# Patient Record
Sex: Male | Born: 1960
Health system: Southern US, Community
[De-identification: ages and names within clinical notes are randomized; demographics above are authoritative.]

## PROBLEM LIST (undated history)

## (undated) DIAGNOSIS — I10 Essential (primary) hypertension: Secondary | ICD-10-CM

## (undated) HISTORY — PX: CHOLECYSTECTOMY: SHX55

---

## 2006-06-13 ENCOUNTER — Emergency Department (HOSPITAL_COMMUNITY): Admission: EM | Admit: 2006-06-13 | Discharge: 2006-06-13 | Payer: Self-pay | Admitting: Emergency Medicine

## 2008-02-07 ENCOUNTER — Encounter: Admission: RE | Admit: 2008-02-07 | Discharge: 2008-02-07 | Payer: Self-pay | Admitting: Family Medicine

## 2008-02-10 ENCOUNTER — Emergency Department (HOSPITAL_COMMUNITY): Admission: EM | Admit: 2008-02-10 | Discharge: 2008-02-10 | Payer: Self-pay | Admitting: Emergency Medicine

## 2008-03-04 ENCOUNTER — Encounter (INDEPENDENT_AMBULATORY_CARE_PROVIDER_SITE_OTHER): Payer: Self-pay | Admitting: General Surgery

## 2008-03-04 ENCOUNTER — Ambulatory Visit (HOSPITAL_COMMUNITY): Admission: RE | Admit: 2008-03-04 | Discharge: 2008-03-04 | Payer: Self-pay | Admitting: General Surgery

## 2008-06-08 ENCOUNTER — Emergency Department (HOSPITAL_COMMUNITY): Admission: EM | Admit: 2008-06-08 | Discharge: 2008-06-08 | Payer: Self-pay | Admitting: Family Medicine

## 2010-04-26 LAB — DIFFERENTIAL
Basophils Absolute: 0.1 10*3/uL (ref 0.0–0.1)
Basophils Absolute: 0.3 10*3/uL — ABNORMAL HIGH (ref 0.0–0.1)
Basophils Relative: 1 % (ref 0–1)
Eosinophils Absolute: 0.3 10*3/uL (ref 0.0–0.7)
Eosinophils Absolute: 0.4 10*3/uL (ref 0.0–0.7)
Eosinophils Relative: 2 % (ref 0–5)
Lymphs Abs: 3.6 10*3/uL (ref 0.7–4.0)
Monocytes Absolute: 0.3 10*3/uL (ref 0.1–1.0)
Monocytes Absolute: 0.8 10*3/uL (ref 0.1–1.0)

## 2010-04-26 LAB — COMPREHENSIVE METABOLIC PANEL
ALT: 83 U/L — ABNORMAL HIGH (ref 0–53)
AST: 17 U/L (ref 0–37)
AST: 31 U/L (ref 0–37)
Albumin: 3.7 g/dL (ref 3.5–5.2)
Alkaline Phosphatase: 107 U/L (ref 39–117)
Alkaline Phosphatase: 108 U/L (ref 39–117)
CO2: 25 mEq/L (ref 19–32)
Chloride: 104 mEq/L (ref 96–112)
Chloride: 108 mEq/L (ref 96–112)
Creatinine, Ser: 0.6 mg/dL (ref 0.4–1.5)
GFR calc Af Amer: >60 mL/min (ref >60)
GFR calc Af Amer: >60 mL/min (ref >60)
GFR calc non Af Amer: >60 mL/min (ref >60)
Potassium: 4.1 mEq/L (ref 3.5–5.1)
Potassium: 3.8 mEq/L (ref 3.5–5.1)
Sodium: 138 mEq/L (ref 135–145)
Total Bilirubin: 0.4 mg/dL (ref 0.3–1.2)
Total Bilirubin: 0.7 mg/dL (ref 0.3–1.2)

## 2010-04-26 LAB — CBC
HCT: 39.1 % (ref 39.0–52.0)
MCV: 92.3 fL (ref 78.0–100.0)
Platelets: 362 10*3/uL (ref 150–400)
RBC: 4.34 MIL/uL (ref 4.22–5.81)
RBC: 3.91 MIL/uL — ABNORMAL LOW (ref 4.22–5.81)
WBC: 12.7 10*3/uL — ABNORMAL HIGH (ref 4.0–10.5)
WBC: 12.7 10*3/uL — ABNORMAL HIGH (ref 4.0–10.5)

## 2010-04-26 LAB — URINALYSIS, ROUTINE W REFLEX MICROSCOPIC
Bilirubin Urine: NEGATIVE
Nitrite: NEGATIVE
Specific Gravity, Urine: 1.026 (ref 1.005–1.030)
Urobilinogen, UA: 0.2 mg/dL (ref 0.0–1.0)

## 2010-04-26 LAB — POCT CARDIAC MARKERS
Myoglobin, poc: 46.4 ng/mL (ref 12–200)
Myoglobin, poc: 48.5 ng/mL (ref 12–200)

## 2010-04-26 LAB — URINE CULTURE

## 2010-04-26 LAB — D-DIMER, QUANTITATIVE: D-Dimer, Quant: <0.22 ug/mL-FEU (ref 0.00–0.48)

## 2010-05-24 NOTE — Op Note (Signed)
Henry Schmitt, Henry Schmitt                ACCOUNT NO.:  [id number]   MEDICAL RECORD NO.:  [id number]          PATIENT TYPE:  AMB   LOCATION:  DAY                          FACILITY:  [facility]   PHYSICIAN:  Juanetta Gosling, MDDATE OF BIRTH:  [birth year]   DATE OF PROCEDURE:  03/04/2008  DATE OF DISCHARGE:                               OPERATIVE REPORT   PREOPERATIVE DIAGNOSIS:  Symptomatic cholelithiasis.   POSTOPERATIVE DIAGNOSIS:  Symptomatic cholelithiasis.   PROCEDURE:  Laparoscopic cholecystectomy.   SURGEON:  Troy Sine. Dwain Sarna, MD.   ASSISTANT:  Currie Paris, M.D.   ANESTHESIA:  General.   FINDINGS:  A chronically inflamed gallbladder.   SPECIMENS:  Gallbladder contents to pathology.   ESTIMATED BLOOD LOSS:  Minimal.   COMPLICATIONS:  None.   DRAINS:  None.   DISPOSITION:  To PACU in stable condition.   INDICATIONS:  Mr. Woerner is a 50 year old male with a several-month  history of right upper quadrant pain that occurs pretty much every time  he eats.  He underwent an ultrasound that showed a contracted  gallbladder filled with stones with a wall measuring 2.4 mm.  He had a  HIDA scan that showed a patent cystic duct without any evidence for  acute cholecystitis.  Liver function tests have been within normal  limits as well as his lipase.  I evaluated him and we discussed a  laparoscopic possible open cholecystectomy for symptomatic  cholelithiasis.   DESCRIPTION OF PROCEDURE:  After informed consent was obtained, the  patient was taken to the operating room.  He was administered 1 gram of  cefoxitin.  Sequential compression devices were placed on the lower  extremities prior to induction.  He was placed under general  endotracheal anesthesia without complication.  His abdomen was then  prepped and draped in standard sterile surgical fashion.  A surgical  time-out was then performed.   A 10-mm vertical incision was then made just below his umbilicus.  His  fascia was entered sharply and his peritoneum was entered bluntly,  without any evidence of injury.  A Vicryl pursestring suture was placed  through his fascia and a Hasson trocar was then introduced.  The abdomen  was then insufflated to 15 mmHg pressure.  Three  5-mm trocars were  placed under direct vision after infiltration with local anesthetic in  the epigastrium and right upper quadrant.  Following this, his omentum  was noted to be very adherent to his gallbladder, and this was taken  down with blunt dissection.  The gallbladder was then retracted cephalad  and lateral.  He had a lot of scarring indicative of chronic  inflammation in his triangle of [place]ot.  His cystic duct and cystic  artery were clearly identified.  The critical view of safety was clearly  obtained prior to clipping.  The cystic duct was then clipped in 3  positions and then divided.  Two clips were left in place.  The cystic  artery was then treated similarly.  The gallbladder was then removed  from the liver bed without difficulty.  This was then placed in  an  EndoCatch bag.  The gallbladder and the EndoCatch bag were then removed  through the umbilical incision without difficulty.  Following this, the  liver bed was inspected.  There was a small area of bleeding that was  controlled with electrocautery.  The remainder of this was clean, with  good hemostasis.  There was no leakage of bile during the operation  either.  The clips appeared in good position.  Irrigation was then  performed.  This was clear.  The umbilical trocar site was then removed  and the suture was tied down.  There was a small leak of air following  this and under direct vision from the epigastrium, I then placed another  of figure-of-eight Vicryl suture.  Following this, the defect was  completely closed.  I then desufflated the abdomen and removed the  remainder of the trocars.  His wounds were then all closed with a 4-0  Monocryl in a  subcuticular fashion.  Dermabond was then placed over the  wounds.  He was extubated in the operating room, having tolerated this  well, and was transferred to the recovery room in stable condition.      Juanetta Gosling, MD  Electronically Signed     MCW/MEDQ  D:  03/04/2008  T:  03/05/2008  Job:  [id number]   cc:   Camillo Flaming, MD   Llana Aliment. Malon Kindle., M.D.  Fax: [phone]

## 2011-02-25 ENCOUNTER — Observation Stay (HOSPITAL_COMMUNITY)
Admission: EM | Admit: 2011-02-25 | Discharge: 2011-02-25 | Disposition: A | Discharge status: Home / Self Care | Payer: Self-pay | Attending: Emergency Medicine | Admitting: Emergency Medicine

## 2011-02-25 ENCOUNTER — Encounter (HOSPITAL_COMMUNITY): Payer: Self-pay | Admitting: *Deleted

## 2011-02-25 DIAGNOSIS — T46905A Adverse effect of unspecified agents primarily affecting the cardiovascular system, initial encounter: Secondary | ICD-10-CM | POA: Insufficient documentation

## 2011-02-25 DIAGNOSIS — I10 Essential (primary) hypertension: Secondary | ICD-10-CM | POA: Insufficient documentation

## 2011-02-25 DIAGNOSIS — T783XXA Angioneurotic edema, initial encounter: Principal | ICD-10-CM | POA: Insufficient documentation

## 2011-02-25 HISTORY — DX: Essential (primary) hypertension: I10

## 2011-02-25 MED ORDER — FAMOTIDINE IN NACL 20-0.9 MG/50ML-% IV SOLN
20.0000 mg | Freq: Once | INTRAVENOUS | Status: AC
  Administered 2011-02-25: 20 mg via INTRAVENOUS
  Filled 2011-02-25: qty 50

## 2011-02-25 MED ORDER — DIPHENHYDRAMINE HCL 50 MG/ML IJ SOLN
25.0000 mg | Freq: Once | INTRAMUSCULAR | Status: AC
  Administered 2011-02-25: 25 mg via INTRAVENOUS
  Filled 2011-02-25: qty 1

## 2011-02-25 MED ORDER — METHYLPREDNISOLONE SODIUM SUCC 125 MG IJ SOLR
125.0000 mg | Freq: Once | INTRAMUSCULAR | Status: AC
  Administered 2011-02-25: 125 mg via INTRAVENOUS
  Filled 2011-02-25: qty 2

## 2011-02-25 MED ORDER — PREDNISONE 20 MG PO TABS: 60.0000 mg | ORAL_TABLET | Freq: Every day | ORAL | Status: AC

## 2011-02-25 NOTE — ED Provider Notes (Signed)
Swelling is improved. Per patient, throat not as tight - feeling better and comfortable with discharge home.   Rodena Medin, PA-C 02/25/11 1353

## 2011-02-25 NOTE — ED Provider Notes (Signed)
Medical screening examination/treatment/procedure(s) were performed by non-physician practitioner and as supervising physician I was immediately available for consultation/collaboration.   Loren Racer, MD 02/25/11 1432

## 2011-02-25 NOTE — ED Notes (Signed)
Pt's tongue swelling is decreasing. Pt reports that he feels better.

## 2011-02-25 NOTE — ED Provider Notes (Signed)
History     CSN: [id number]  Arrival date & time 02/25/11  9562   First MD Initiated Contact with Patient 02/25/11 [phone]      Chief Complaint  Patient presents with  . Angioedema    (Consider location/radiation/quality/duration/timing/severity/associated sxs/prior treatment) HPI Patient presents to the ED with a 2 day history of angioedema. He states that he had some swelling in his right hand and the left side of his face/lips yesterday and then last night his tongue started to swell. He denies shortness of breath and is able to swallow ok. He took some benadryl at home. He takes lisinopril-hctz for HTN. He denies chest pain,N/V/D.  He denies any other allergies.  Past Medical History  Diagnosis Date  . Hypertension     Past Surgical History  Procedure Date  . Cholecystectomy     Family History  Problem Relation Age of Onset  . Heart failure Mother   . Hypertension Mother   . Diabetes Other     History  Substance Use Topics  . Smoking status: Current Everyday Smoker -- 0.5 packs/day  . Smokeless tobacco: Not on file  . Alcohol Use: No      Review of Systems All pertinent positives and negatives reviewed in the history of present illness  Allergies  Review of patient's allergies indicates no known allergies.  Home Medications   Current Outpatient Rx  Name Route Sig Dispense Refill  . DIPHENHYDRAMINE HCL 25 MG PO CAPS Oral Take 25 mg by mouth every 6 (six) hours as needed. For allergic reaction    . LISINOPRIL-HYDROCHLOROTHIAZIDE 10-12.5 MG PO TABS Oral Take 1 tablet by mouth daily.      BP 155/94  Pulse 71  Temp(Src) 98.3 F (36.8 C) (Oral)  Resp 12  SpO2 100%  Physical Exam  Constitutional: He appears well-developed and well-nourished. No distress.  HENT:  Head: Normocephalic and atraumatic.  Mouth/Throat: Oropharynx is clear and moist and mucous membranes are normal.    Skin: He is not diaphoretic.    ED Course  Procedures (including  critical care time)  Will watch the patient here in the ER and monitor his course. This is a Lisinopril related angioedema.         MDM          Carlyle Dolly, PA-C 02/25/11 0730  Medical screening examination/treatment/procedure(s) were conducted as a shared visit with non-physician practitioner(s) and myself.  I personally evaluated the patient during the encounter. Patient evaluated for her left tongue swelling angioedema. He has moderate swelling of his tongue. No throat swelling or voice changes. Lungs clear. Patient notified to stop lisinopril. Given IV solu Medrol and Benadryl and placed on observation protocol.    Sunnie Nielsen, MD 02/25/11 [phone]

## 2011-02-25 NOTE — ED Notes (Signed)
Presents with c/o tongue, lips and facial swelling. Onset last evening.  Possible allergic reaction.

## 2011-02-25 NOTE — ED Notes (Signed)
C/o lip, face, throat & tongue swelling, onset sometime last night, woke with swelling, has been on lisinopril for ~ 2 yrs. Reports some sob, denies itching. Alert, NAD, calm, interactive, skin W&D, resps e/u, speech alterred, LS CTA,

## 2011-02-25 NOTE — ED Notes (Signed)
Pt reports feeling groggy since getting the benadryl. Pt reports that tongue doesn't feel any different.

## 2011-02-25 NOTE — Discharge Instructions (Signed)
STOP TAKING LISINOPRIL AS THIS IS THE LIKELY CAUSE OF YOUR RECURRENT SYMPTOMS. FOLLOW UP WITH DR. BARNES NEXT WEEK TO DISCUSS A REPLACEMENT MEDICATION TO CONTROL YOUR BLOOD PRESSURE. RETURN HERE WITH ANY RECURRENT OR WORSENING SYMPTOMS, OR NEW CONCERNS.   Angioedema Angioedema (AE) is sudden puffiness (swelling) of the skin. It can happen to the face, genitals, and other body parts. You may be reacting to something you are sensitive to (allergic reaction). It may have been passed to you from your parents (hereditary), or it may develop by itself (acquired). You may also get red itchy patches of skin (hives). Attacks may be mild. Some attacks are life-threatening. Most often, the puffiness happens fast. It often gets better in 24 to 48 hours.  HOME CARE  Carry your emergency allergy medicines with you.   Wear a medical bracelet.   Avoid things that you know will cause this reaction (triggers).  GET HELP RIGHT AWAY IF:   You have trouble breathing.   You have trouble swallowing.   You pass out (faint).   You have another attack.   Your attacks happen more often or get worse.   AE was passed to you by your parents and you want to start having children.  MAKE SURE YOU:   Understand these instructions.   Will watch your condition.   Will get help right away if you are not doing well or get worse.  Document Released: 12/14/2008 Document Revised: 09/07/2010 Document Reviewed: 12/14/2008 [facility] Patient Information 2012 [place], [place].

## 2011-10-29 ENCOUNTER — Emergency Department (HOSPITAL_COMMUNITY)
Admission: EM | Admit: 2011-10-29 | Discharge: 2011-10-29 | Disposition: A | Discharge status: Home / Self Care | Payer: Self-pay | Attending: Emergency Medicine | Admitting: Emergency Medicine

## 2011-10-29 ENCOUNTER — Encounter (HOSPITAL_COMMUNITY): Payer: Self-pay | Admitting: *Deleted

## 2011-10-29 DIAGNOSIS — K0889 Other specified disorders of teeth and supporting structures: Secondary | ICD-10-CM

## 2011-10-29 DIAGNOSIS — K029 Dental caries, unspecified: Secondary | ICD-10-CM | POA: Insufficient documentation

## 2011-10-29 DIAGNOSIS — I10 Essential (primary) hypertension: Secondary | ICD-10-CM | POA: Insufficient documentation

## 2011-10-29 DIAGNOSIS — F17200 Nicotine dependence, unspecified, uncomplicated: Secondary | ICD-10-CM | POA: Insufficient documentation

## 2011-10-29 MED ORDER — HYDROCODONE-ACETAMINOPHEN 5-325 MG PO TABS: 1.0000 | ORAL_TABLET | Freq: Four times a day (QID) | ORAL | Status: DC | PRN

## 2011-10-29 MED ORDER — PENICILLIN V POTASSIUM 500 MG PO TABS: 500.0000 mg | ORAL_TABLET | Freq: Four times a day (QID) | ORAL | Status: DC

## 2011-10-29 MED ORDER — IBUPROFEN 800 MG PO TABS: 800.0000 mg | ORAL_TABLET | Freq: Three times a day (TID) | ORAL | Status: DC | PRN

## 2011-10-29 NOTE — ED Provider Notes (Signed)
History     CSN: [id number]  Arrival date & time 10/29/11  1046   First MD Initiated Contact with Patient 10/29/11 1100      Chief Complaint  Patient presents with  . Dental Pain    (Consider location/radiation/quality/duration/timing/severity/associated sxs/prior treatment) HPI Patient presents to the ER with R upper molar pain that began 2 days ago. The patient states that he has had issues with this tooth in the past. The patient denies fever, nausea, vomiting, weakness, headache, throat swelling, difficulty swallowing, or neck swelling. The patient states that he does not see a dentist. The patient did call a dentist for an appointment. Patient took OTC medications for the pain without relief. The patient also used dental putty to cover the area that broke off. Past Medical History  Diagnosis Date  . Hypertension     Past Surgical History  Procedure Date  . Cholecystectomy     Family History  Problem Relation Age of Onset  . Heart failure Mother   . Hypertension Mother   . Diabetes Other     History  Substance Use Topics  . Smoking status: Current Every Day Smoker -- 0.5 packs/day  . Smokeless tobacco: Not on file  . Alcohol Use: No      Review of Systems All other systems negative except as documented in the HPI. All pertinent positives and negatives as reviewed in the HPI.  Allergies  Antihistamines, chlorpheniramine-type  Home Medications   Current Outpatient Rx  Name Route Sig Dispense Refill  . ACETAMINOPHEN-CODEINE 300-60 MG PO TABS Oral Take 1 tablet by mouth every 4 (four) hours as needed. This was from a friend    . BC HEADACHE POWDER PO Oral Take 1 packet by mouth every 6 (six) hours as needed. pain    . IBUPROFEN 400 MG PO TABS Oral Take 400 mg by mouth every 6 (six) hours as needed. pain    . LISINOPRIL-HYDROCHLOROTHIAZIDE 10-12.5 MG PO TABS Oral Take 1 tablet by mouth daily.      BP 151/85  Pulse 79  Temp 98.3 F (36.8 C) (Oral)  Resp  16  SpO2 100%  Physical Exam  Nursing note and vitals reviewed. Constitutional: He appears well-developed and well-nourished.  HENT:  Head: No trismus in the jaw.  Mouth/Throat: Uvula is midline and oropharynx is clear and moist. Abnormal dentition. Dental caries present. No dental abscesses or uvula swelling.    Cardiovascular: Normal rate, regular rhythm and normal heart sounds.  Exam reveals no gallop and no friction rub.   No murmur heard. Pulmonary/Chest: Effort normal and breath sounds normal. No respiratory distress.    ED Course  Procedures (including critical care time)  The patient is advised to follow up with dentistry. Told to return here as needed. Rinse with warm water and peroxide 3 times per day.  MDM          Carlyle Dolly, PA-C 11/02/11 [phone]

## 2011-10-29 NOTE — ED Notes (Addendum)
Pt reports he chipped a tooth on upper right side of mouth a few months ago. Tooth has not caused issues/ pain until 2 weeks ago. Intermittent pain 3/10. sensative to cold and hot temperatured food. Pt has been taking ibuprofen, codiene, oragel, bc powder, salt water, peroxide with no relief.   Does have an appointment to dentist at end of month.

## 2011-11-03 NOTE — ED Provider Notes (Signed)
Medical screening examination/treatment/procedure(s) were performed by non-physician practitioner and as supervising physician I was immediately available for consultation/collaboration.   Deazia Lampi M Hadiyah Maricle, MD 11/03/11 2121 

## 2014-02-05 ENCOUNTER — Other Ambulatory Visit: Payer: Self-pay | Admitting: Family Medicine

## 2014-02-06 ENCOUNTER — Other Ambulatory Visit: Payer: Self-pay | Admitting: Family Medicine

## 2014-02-06 DIAGNOSIS — N62 Hypertrophy of breast: Secondary | ICD-10-CM

## 2014-02-06 DIAGNOSIS — N63 Unspecified lump in unspecified breast: Secondary | ICD-10-CM

## 2014-02-16 ENCOUNTER — Ambulatory Visit
Admission: RE | Admit: 2014-02-16 | Discharge: 2014-02-16 | Disposition: A | Discharge status: Home / Self Care | Payer: BLUE CROSS/BLUE SHIELD | Source: Ambulatory Visit | Attending: Family Medicine | Admitting: Family Medicine

## 2014-02-16 DIAGNOSIS — N62 Hypertrophy of breast: Secondary | ICD-10-CM

## 2014-02-16 DIAGNOSIS — N63 Unspecified lump in unspecified breast: Secondary | ICD-10-CM

## 2014-03-18 ENCOUNTER — Ambulatory Visit (INDEPENDENT_AMBULATORY_CARE_PROVIDER_SITE_OTHER): Payer: BLUE CROSS/BLUE SHIELD | Admitting: Internal Medicine

## 2014-03-18 ENCOUNTER — Encounter: Payer: Self-pay | Admitting: Internal Medicine

## 2014-03-18 VITALS — BP 124/70 | HR 69 | Temp 97.9°F | Resp 12 | Wt 188.6 lb

## 2014-03-18 DIAGNOSIS — N62 Hypertrophy of breast: Secondary | ICD-10-CM

## 2014-03-18 NOTE — Patient Instructions (Addendum)
Please come back for labs at 8 am, fasting.  Please try to join MyChart for easier communication.

## 2014-03-18 NOTE — Progress Notes (Addendum)
Patient ID: Henry Schmitt, male   DOB: 1960/09/28, 54 y.o.   MRN: [id number]  HPI: Henry Schmitt is a 54 y.o.-year-old man, referred by his PCP, Dr. Drema Dallas, for evaluation and management of gynecomastia.  He started to notice breast increasing in size "years ago" but started have R breast tenderness (2-3 mo ago). Over the last 2 weeks, the right breast discomfort got better.   Denies hitting his chest. No breast discharge. No FH of breast or ovarian cancer. No new meds. No narcotics. No steroids/anabolic steroids. No marijuana. No changes in weight. Has HTN >> previously on Lisinopril >> taken off 1 year ago for presumed allergy Had a R testicle removed as a teenager (17 or 54 y/o) b/c swelling. + shrinking of the L testicle after the right testicle has been removed No h/o cryptorchidism. No hot flushes.  No low libido or pbs with erections. He never had to shave! No abnormal sense of smell (only allergies). No incomplete/delayed sexual development  He grew and went through puberty like his peers No personal h/o infertility, but no children No FH of hemochromatosis or pituitary tumors No excessive weight gain or loss.  No chronic diseases No chronic pain. Not on opiates, does not take steroids.  No more than 2 drinks a day of alcohol at a time, and this is rarely No AI ds in his family, no FH of MS. + does have family history of early cardiac disease in mother: CHF No herbal medicines Not on antidepressants  Mammogram (02/16/2014): R>L benign gynecomastia  Breast U/S (02/16/2014): confirmed R>L benign gynecomastia  Also reviewed labs obtained by PCP on 02/06/2014: - Normal kidney function - AST 37 (0-39), ALT 55 (0-52), alkaline phosphatase 133 (33-126)  - Glucose 117, hemoglobin A1c 5.5%  - White blood cell count 12.5 (4-11), patient mentions that he always has high WBC count  - Cholesterol level: 174/62/43/118  Patient also mentions a history of  hypertension.  ROS: Constitutional: + weight gain, + fatigue, no subjective hyperthermia/hypothermia, + poor sleep, +  increased urination and nocturia  Eyes: no blurry vision, no xerophthalmia ENT: no sore throat, no nodules palpated in throat, no dysphagia/odynophagia, no hoarseness, + decreased hearing Cardiovascular: no CP/+ SOB/no palpitations/leg swelling Respiratory: no cough/+ SOB Gastrointestinal: no N/V/D/C Musculoskeletal: no muscle/joint aches Skin: no rashes Neurological: no tremors/numbness/tingling/dizziness Psychiatric: no depression/anxiety  Past Medical History  Diagnosis Date  . Hypertension    Past Surgical History  Procedure Laterality Date  . Cholecystectomy     History   Social History  . Marital Status: Single    Spouse Name: N/A  . Number of Children: 0   Occupational History  . Scientist, clinical (histocompatibility and immunogenetics)   Social History Main Topics  . Smoking status: Current Every Day Smoker -- 0.50 packs/day  . Smokeless tobacco: Not on file  . Alcohol Use: No  . Drug Use: No   Current Outpatient Prescriptions on File Prior to Visit  Medication Sig Dispense Refill  . acetaminophen-codeine (TYLENOL #4) 300-60 MG per tablet Take 1 tablet by mouth every 4 (four) hours as needed. This was from a friend    . Aspirin-Salicylamide-Caffeine (BC HEADACHE POWDER PO) Take 1 packet by mouth every 6 (six) hours as needed. pain    . HYDROcodone-acetaminophen (NORCO/VICODIN) 5-325 MG per tablet Take 1 tablet by mouth every 6 (six) hours as needed for pain. (Patient not taking: Reported on 03/18/2014) 15 tablet 0  . ibuprofen (ADVIL,MOTRIN) 400 MG tablet Take 400 mg by mouth every  6 (six) hours as needed. pain    . ibuprofen (ADVIL,MOTRIN) 800 MG tablet Take 1 tablet (800 mg total) by mouth every 8 (eight) hours as needed for pain. (Patient not taking: Reported on 03/18/2014) 21 tablet 0  . lisinopril-hydrochlorothiazide (PRINZIDE,ZESTORETIC) 10-12.5 MG per tablet Take 1 tablet by mouth  daily.    . penicillin v potassium (VEETID) 500 MG tablet Take 1 tablet (500 mg total) by mouth 4 (four) times daily. (Patient not taking: Reported on 03/18/2014) 28 tablet 0   No current facility-administered medications on file prior to visit.   Allergies  Allergen Reactions  . Antihistamines, Chlorpheniramine-Type Hives and Swelling   Family History  Problem Relation Age of Onset  . Heart failure Mother   . Hypertension Mother   . Diabetes Other    PE: BP 124/70 mmHg  Pulse 69  Temp(Src) 97.9 F (36.6 C) (Oral)  Resp 12  Wt 188 lb 9.6 oz (85.548 kg)  SpO2 98% There is no height on file to calculate BMI. Wt Readings from Last 3 Encounters:  03/18/14 188 lb 9.6 oz (85.548 kg)   Constitutional: overweight, in NAD Eyes: PERRLA, EOMI, no exophthalmos ENT: moist mucous membranes, no thyromegaly, no cervical lymphadenopathy Cardiovascular: RRR, No MRG Respiratory: CTA B Gastrointestinal: abdomen soft, NT, ND, BS+ Musculoskeletal: no deformities, strength intact in all 4 Skin: moist, warm, no rashes Neurological: no tremor with outstretched hands, DTR normal in all 4 Genital exam: Refused + Small, bilateral, gynecomastia. Large breasts. No axillary lymphadenopathy. No changes in appearance of the nipple, no nipple discharges. No pain on palpation.  ASSESSMENT: 1. Gynecomastia  PLAN:   Pt with bilateral gynecomastia, R>L, without discharges, with benign-appearing mammogram and breast ultrasound. There is a small amount of gynecomastia on the imaging tests, however, he has significant a significant amount of fat behind the breast gland bilaterally. We discussed about possible causes for gynecomastia and we will evaluate for the following:  - pt without kidney disease for the latest CMP obtained by PCP on 02/06/2014  - he has mildly elevated LFTs, which we will recheck  - he had a normal TSH in 2009 per review of records, no recent checks. Will check a TSH. No clinically  apparent thyroid ds - not using THC, alcohol, anabolic steroids or OTC supplements - was on Lisinopril, which can rarely cause gynecomastia, now off; no other Rx culprits - no h/o gynecomastia in puberty - no FH of Breast or ovarian cancer (BRCA) - no h/o infertility, but he is vague about this, and it's unclear to me whether he did not have children by choice  - no mm fasciculations or tremors (spinal or bulbar mm atrophy) - apparently, no decreased erections or libido; will check a testosterone level in the prolactin - no  left testicular masses or pain; will check an estrogen level and a beta-hCG   However, he has a very interesting history of right orchiectomy as a teenager, followed by involution of the left testicle. He also did not have to shave throughout his life. It is very unusual that he has normal libido and erections in this context. I suspect that he has a low testosterone. We discussed about this and he agrees to start testosterone therapy if needed to decrease the size of his breasts. However, I explained that even though he has gynecomastia, the actual amount of breast tissue small, and therefore, treatments that targeted will likely cause a significant change in the appearance of his breasts, since the  majority of the breast is formed by fat tissue. In this case, cosmetic surgery (liposuction) is the only one treatment modality that will work.  Since he also has discomfort in the right breast, we can give him a trial of tamoxifen, if needed to see if this helps the breast size. I would not continue more than 3-6 months. If no effect seen in 3-6 months, we will need to stop it.  Plan: - await results of the labs above - he will return at 8 in the morning, fasting -  may need to start Tamoxifen for the next 3 months to see if helps in terms of breast size and pain - RTC in 3 months - recheck size of breasts/pain level   Orders Placed This Encounter  Procedures  . Testosterone,  Free, Total, SHBG  . LH  . [place]  . Estradiol  . TSH  . T4, free  . T3, free  . Prolactin  . B-HCG Quant  . Hepatic function panel   - time spent with the patient: 1 hour, of which >50% was spent in obtaining information about his symptoms, reviewing his previous labs, imaging evaluations, visits, counseling him about hIS condition (please see the discussed topics above), and developing a plan to further investigate and treat it.   Component     Latest Ref Rng 03/23/2014  Total Bilirubin     0.2 - 1.2 mg/dL 0.2  Bilirubin, Direct     0.0 - 0.3 mg/dL 0.1  Alkaline Phosphatase     39 - 117 U/L 116  AST     0 - 37 U/L 19  ALT     0 - 53 U/L 26  Total Protein     6.0 - 8.3 g/dL 6.9  Albumin     3.5 - 5.2 g/dL 4.0  Testosterone     300 - 890 ng/dL 22 (L)  Sex Hormone Binding     10 - 50 nmol/L 75 (H)  Testosterone Free     47.0 - 244.0 pg/mL 2.2 (L)  Testosterone-% Free     1.6 - 2.9 % 1.0 (L)  Estradiol, Free     ADULTS: < OR = 0.45 pg/mL 0.07  Estradiol     ADULTS: < OR = 29 pg/mL 6  LH     1.50 - 9.30 mIU/mL 3.85  FSH     1.4 - 18.1 mIU/ML 20.4 (H)  TSH     0.35 - 4.50 uIU/mL 0.59  Free T4     0.60 - 1.60 ng/dL 0.94  T3, Free     2.3 - 4.2 pg/mL 3.6  Prolactin     2.1 - 17.1 ng/mL 6.1  Quantitative HCG      1.33   Very low testosterone levels. LH inappropriately normal. [place] slightly high, but still inappropriately increased. I suspect that this is secondary hypogonadism rather than primary.  Estradiol and Beta hCG are normal. No indication of testicular tumors. TFTs are normal. Prolactin is normal. LFTs are normal.  I believe that his hypogonadism is the reason for his gynecomastia.  At this point we have to further investigate the pituitary, by checking an 8 AM cortisol, ACTH, GH and IGF-I. The rest of the pituitary labs are normal with the exception of LH (and possibly [place]) as mentioned above.  I believe I also need to check a pituitary MRI, although, it  appears that his hypogonadism is long-standing, since he never had to shave. I will discuss with  patient about starting testosterone gel. I would like to also check a CBC and a PSA before starting testosterone. I will also discuss with patient that he will need rectal exams yearly by PCP or urologist. I would hold off tamoxifen for now since his estradiol is very low, and the tamoxifen acts by blocking its receptors.  Labs to check: - CBC - PSA - IGF-I - Growth hormone - Cortisol - ACTH

## 2014-03-19 ENCOUNTER — Other Ambulatory Visit: Payer: BLUE CROSS/BLUE SHIELD

## 2014-03-19 ENCOUNTER — Telehealth: Payer: Self-pay | Admitting: Endocrinology

## 2014-03-23 ENCOUNTER — Other Ambulatory Visit (INDEPENDENT_AMBULATORY_CARE_PROVIDER_SITE_OTHER): Payer: BLUE CROSS/BLUE SHIELD

## 2014-03-23 DIAGNOSIS — N62 Hypertrophy of breast: Secondary | ICD-10-CM

## 2014-03-23 LAB — HEPATIC FUNCTION PANEL
ALBUMIN: 4 g/dL (ref 3.5–5.2)
ALT: 26 U/L (ref 0–53)
AST: 19 U/L (ref 0–37)
Alkaline Phosphatase: 116 U/L (ref 39–117)
Bilirubin, Direct: 0.1 mg/dL (ref 0.0–0.3)
TOTAL PROTEIN: 6.9 g/dL (ref 6.0–8.3)
Total Bilirubin: 0.2 mg/dL (ref 0.2–1.2)

## 2014-03-23 LAB — HCG, QUANTITATIVE, PREGNANCY: Quantitative HCG: 1.33 m[IU]/mL

## 2014-03-23 LAB — T3, FREE: T3 FREE: 3.6 pg/mL (ref 2.3–4.2)

## 2014-03-23 LAB — TSH: TSH: 0.59 u[IU]/mL (ref 0.35–4.50)

## 2014-03-23 LAB — LUTEINIZING HORMONE: LH: 3.85 m[IU]/mL (ref 1.50–9.30)

## 2014-03-23 LAB — T4, FREE: FREE T4: 0.94 ng/dL (ref 0.60–1.60)

## 2014-03-23 LAB — FOLLICLE STIMULATING HORMONE: FSH: 20.4 m[IU]/mL — ABNORMAL HIGH (ref 1.4–18.1)

## 2014-03-23 NOTE — Telephone Encounter (Signed)
error 

## 2014-03-24 LAB — PROLACTIN: Prolactin: 6.1 ng/mL (ref 2.1–17.1)

## 2014-03-24 LAB — TESTOSTERONE, FREE, TOTAL, SHBG
Sex Hormone Binding: 75 nmol/L — ABNORMAL HIGH (ref 10–50)
TESTOSTERONE FREE: 2.2 pg/mL — AB (ref 47.0–244.0)
TESTOSTERONE-% FREE: 1 % — AB (ref 1.6–2.9)
Testosterone: 22 ng/dL — ABNORMAL LOW (ref 300–890)

## 2014-03-28 LAB — ESTRADIOL, FREE
ESTRADIOL FREE: 0.07 pg/mL (ref <=0.45)
Estradiol: 6 pg/mL (ref <=29)

## 2014-04-01 ENCOUNTER — Telehealth: Payer: Self-pay | Admitting: Internal Medicine

## 2014-04-01 NOTE — Telephone Encounter (Signed)
CALLED PT TO SCHEDULE NEW PATIENT APPT (HEMATOLOGY). LEFT MESSAGE.   DX: ABNORMAL CBC REFERRING: DR. BARNES

## 2014-04-06 ENCOUNTER — Ambulatory Visit (INDEPENDENT_AMBULATORY_CARE_PROVIDER_SITE_OTHER): Payer: BLUE CROSS/BLUE SHIELD | Admitting: Internal Medicine

## 2014-04-06 ENCOUNTER — Encounter: Payer: Self-pay | Admitting: Internal Medicine

## 2014-04-06 VITALS — BP 112/68 | HR 72 | Temp 98.0°F | Resp 12 | Wt 189.0 lb

## 2014-04-06 DIAGNOSIS — E230 Hypopituitarism: Secondary | ICD-10-CM | POA: Diagnosis not present

## 2014-04-06 DIAGNOSIS — N62 Hypertrophy of breast: Secondary | ICD-10-CM | POA: Diagnosis not present

## 2014-04-06 LAB — PSA: PSA: 0.04 ng/mL — AB (ref 0.10–4.00)

## 2014-04-06 LAB — CORTISOL: CORTISOL PLASMA: 8.9 ug/dL

## 2014-04-06 NOTE — Patient Instructions (Addendum)
Please stop at the lab.  You will be called with the MRI schedule.  I will let you know about the testosterone therapy as soon as the results are back.  Please get a rectal exam by your PCP before we start Testosterone therapy.  Please come back for a follow-up appointment in 6 months.

## 2014-04-06 NOTE — Progress Notes (Signed)
Patient ID: Henry Schmitt, male   DOB: 1960-04-12, 54 y.o.   MRN: [id number]  HPI: Henry Schmitt is a 54 y.o.-year-old man, initially referred by his PCP, Dr. Drema Dallas, for evaluation and management of gynecomastia. During the investigation for gynecomastia, he was found to be profoundly hypogonadotropic. This is a counseling meeting scheduled to discuss his hypogonadotropic hypogonadism.  Reviewed history: He started to notice breast increasing in size "years ago" but started have R breast tenderness (3 mo ago). Now the discomfort is getting better  Denies hitting his chest. No breast discharge. No FH of breast or ovarian cancer. No new meds. No narcotics. No steroids/anabolic steroids. No marijuana. No changes in weight. Has HTN >> previously on Lisinopril >> taken off 1 year ago for presumed allergy Had a R testicle removed as a teenager (80 or 54 y/o) b/c swelling. + shrinking of the L testicle after the right testicle has been removed No h/o cryptorchidism. No hot flushes.  No low libido or pbs with erections. He never had to shave! No abnormal sense of smell (only allergies). No incomplete/delayed sexual development  He grew and went through puberty like his peers No personal h/o infertility, but no children No FH of hemochromatosis or pituitary tumors No excessive weight gain or loss.  No chronic diseases No chronic pain. Not on opiates, does not take steroids.  No more than 2 drinks a day of alcohol at a time, and this is rarely No AI ds in his family, no FH of MS. + does have family history of early cardiac disease in mother: CHF No herbal medicines Not on antidepressants  Mammogram (02/16/2014): R>L benign gynecomastia   Breast U/S (02/16/2014): confirmed R>L benign gynecomastia  Reviewed labs obtained by PCP on 02/06/2014: - Normal kidney function - AST 37 (0-39), ALT 55 (0-52), alkaline phosphatase 133 (33-126)  - Glucose 117, hemoglobin A1c 5.5%  - White blood cell  count 12.5 (4-11), patient mentions that he always has high WBC count  - Cholesterol level: 174/62/43/118  At last visit, we checked the following labs, and his testosterone returned very low, at castrate level: Component     Latest Ref Rng 03/23/2014  Total Bilirubin     0.2 - 1.2 mg/dL 0.2  Bilirubin, Direct     0.0 - 0.3 mg/dL 0.1  Alkaline Phosphatase     39 - 117 U/L 116  AST     0 - 37 U/L 19  ALT     0 - 53 U/L 26  Total Protein     6.0 - 8.3 g/dL 6.9  Albumin     3.5 - 5.2 g/dL 4.0  Testosterone     300 - 890 ng/dL 22 (L)  Sex Hormone Binding     10 - 50 nmol/L 75 (H)  Testosterone Free     47.0 - 244.0 pg/mL 2.2 (L)  Testosterone-% Free     1.6 - 2.9 % 1.0 (L)  Estradiol, Free     ADULTS: < OR = 0.45 pg/mL 0.07  Estradiol     ADULTS: < OR = 29 pg/mL 6  LH     1.50 - 9.30 mIU/mL 3.85  FSH     1.4 - 18.1 mIU/ML 20.4 (H)  TSH     0.35 - 4.50 uIU/mL 0.59  Free T4     0.60 - 1.60 ng/dL 0.94  T3, Free     2.3 - 4.2 pg/mL 3.6  Prolactin     2.1 - 17.1  ng/mL 6.1  Quantitative HCG      1.33  Very low testosterone levels. LH inappropriately normal. [place] slightly high, but still inappropriately increased. Estradiol and Beta hCG are normal. No indication of testicular tumors. TFTs are normal. Prolactin is normal. LFTs are normal.  I also received further labs from PCP, obtained on 03/27/2014: - CBC with differential normal, except mild anemia, at 12.9 (13-17). - Estradiol 5.2 (7.6-42.6) - LH 3.8 (1.7-8.6)  - Chronic Hepatitis panel negative   03/11/2014: - Total testosterone 22.1 ([phone]) - Beta hCG <0.6 - LFTs: Normal   Patient also has a history of hypertension.  ROS: Constitutional: no weight gain/loss, + fatigue, no subjective hyperthermia/hypothermia Eyes: no blurry vision, no xerophthalmia ENT: no sore throat, no nodules palpated in throat, no dysphagia/odynophagia, no hoarseness Cardiovascular: no CP/SOB/palpitations/leg swelling Respiratory:  no cough/SOB Gastrointestinal: no N/V/D/C Musculoskeletal: no muscle/joint aches Skin: no rashes Neurological: no tremors/numbness/tingling/+ occasional  dizziness + Only slight discomfort in breasts   Past Medical History  Diagnosis Date  . Hypertension    Past Surgical History  Procedure Laterality Date  . Cholecystectomy     History   Social History  . Marital Status: Single    Spouse Name: N/A  . Number of Children: 0   Occupational History  . Scientist, clinical (histocompatibility and immunogenetics)   Social History Main Topics  . Smoking status: Current Every Day Smoker -- 0.50 packs/day  . Smokeless tobacco: Not on file  . Alcohol Use: No  . Drug Use: No   Current Outpatient Prescriptions on File Prior to Visit  Medication Sig Dispense Refill  . acetaminophen-codeine (TYLENOL #4) 300-60 MG per tablet Take 1 tablet by mouth every 4 (four) hours as needed. This was from a friend    . Aspirin-Salicylamide-Caffeine (BC HEADACHE POWDER PO) Take 1 packet by mouth every 6 (six) hours as needed. pain    . HYDROcodone-acetaminophen (NORCO/VICODIN) 5-325 MG per tablet Take 1 tablet by mouth every 6 (six) hours as needed for pain. (Patient not taking: Reported on 03/18/2014) 15 tablet 0  . ibuprofen (ADVIL,MOTRIN) 400 MG tablet Take 400 mg by mouth every 6 (six) hours as needed. pain    . ibuprofen (ADVIL,MOTRIN) 800 MG tablet Take 1 tablet (800 mg total) by mouth every 8 (eight) hours as needed for pain. (Patient not taking: Reported on 03/18/2014) 21 tablet 0  . lisinopril-hydrochlorothiazide (PRINZIDE,ZESTORETIC) 10-12.5 MG per tablet Take 1 tablet by mouth daily.    . Multiple Vitamin (MULTIVITAMIN) tablet Take 1 tablet by mouth daily.    . penicillin v potassium (VEETID) 500 MG tablet Take 1 tablet (500 mg total) by mouth 4 (four) times daily. (Patient not taking: Reported on 03/18/2014) 28 tablet 0   No current facility-administered medications on file prior to visit.   Allergies  Allergen Reactions  .  Antihistamines, Chlorpheniramine-Type Hives and Swelling   Family History  Problem Relation Age of Onset  . Heart failure Mother   . Hypertension Mother   . Diabetes Other    PE: BP 112/68 mmHg  Pulse 72  Temp(Src) 98 F (36.7 C) (Oral)  Resp 12  Wt 189 lb (85.73 kg)  SpO2 98% There is no height on file to calculate BMI. Wt Readings from Last 3 Encounters:  04/06/14 189 lb (85.73 kg)  03/18/14 188 lb 9.6 oz (85.548 kg)   Constitutional: overweight, in NAD Eyes: PERRLA, EOMI, no exophthalmos ENT: moist mucous membranes, no thyromegaly, no cervical lymphadenopathy Cardiovascular: RRR, No MRG Respiratory: CTA B Gastrointestinal:  abdomen soft, NT, ND, BS+ Musculoskeletal: no deformities, strength intact in all 4 Skin: moist, warm, no rashes Neurological: no tremor with outstretched hands, DTR normal in all 4  ASSESSMENT: 1. Hypogonadotropic hypogonadism  2. Gynecomastia  PLAN:  1. Hypogonadotropic hypogonadism  - I discussed with the patient about his recent results a very low testosterone levels (confirmed at the appointment with PCP) with inappropriately normal LH. I explained that this is a sign of pituitary insufficiency. Based on the fact that he didn't have to shave throughout his life and is enlarged breasts, I suspect that his hypogonadism has been going on for a long time. He does have a history of single testicular removal, however, his diagnosis is secondary, rather than primary hypogonadism.  - I explained that we need to check the rest of his pituitary function (his thyroid tests and prolactin were normal) to check for secondary adrenal insufficiency of growth hormone deficiency. He agrees with these tests. He is now fasting, and we can check the labs this a.m. - Patient also agrees to have a pituitary MRI to rule out structural defects of the pituitary gland or stalk. I ordered this. - If his hypogonadism is the only pituitary deficiency, which is most likely, he  agrees to start testosterone treatment. We discussed about different formulations of testosterone and he agrees to start with AndroGel. I explained how this is applied. I also suggested that he gets a digital rectal exam by PCP or urologist and he needs to continue to have these every year during the treatment with testosterone. We also need to check CBC and PSA at least once a year during treatment. He had a recent CBC that shows slight anemia, which is not uncommon with such a low testosterone. Also, other complications from low testosterone are: Osteoporosis, high risk of diabetes and stroke.  - Today we will check the following labs:  Orders Placed This Encounter  Procedures  . MR Brain W Wo Contrast  . Cortisol  . ACTH  . PSA  . Insulin-like growth factor  . Growth hormone   2. Pt with small, bilateral, gynecomastia, R>L, without discharges, with benign-appearing mammogram and breast ultrasound. There is a small amount of gynecomastia on the imaging tests, however, he has significant a amount of fat behind the breast gland bilaterally. - we discussed about the fact that he is estradiol is low, so I would not recommend tamoxifen in this case.  - He agrees to start testosterone and see if gynecomastia improves  - We again discussed that he will most likely need liposuction to improve the appearance of his breasts   CLINICAL DATA: 54 year old male with low testosterone. Tender breasts. Hypertension. Initial encounter.  EXAM: MRI HEAD WITHOUT AND WITH CONTRAST  TECHNIQUE: Multiplanar, multiecho pulse sequences of the brain and surrounding structures were obtained without and with intravenous contrast.  CONTRAST: 10 cc MultiHance.  COMPARISON: None.  FINDINGS: Slightly heterogeneous appearance of the pituitary gland with minimal deviation of the infundibulum to left. No definitive evidence of pituitary microadenoma.  No acute infarct.  No intracranial hemorrhage.  No  hydrocephalus.  Mild punctate and patchy white matter type changes probably related to result of small vessel disease.  No intracranial enhancing lesion/ mass.  Mild exophthalmos.  Major intracranial vascular structures are patent.  Minimal mucosal thickening ethmoid sinuses and maxillary sinuses.  IMPRESSION: No pituitary microadenoma identified.  Mild small vessel disease type changes.  Please see above.   Electronically Signed By: Alcide Evener.D.  On: 04/09/2014 11:25            Component     Latest Ref Rng 04/06/2014  IGF-I, LC/MS     50 - 317 ng/mL 137  Z-Score (Male)     -2.0-+2.0 SD 0.0  Cortisol, Plasma      8.9  C206 ACTH     6 - 50 pg/mL 14  PSA     0.10 - 4.00 ng/mL 0.04 (L)  Growth Hormone     <=7.1 ng/mL 0.7   The rest of the pituitary tests are normal. PSA is low. I will discuss with patient whether he had the rectal exam already. We will start 5 g of AndroGel.

## 2014-04-08 LAB — ACTH: C206 ACTH: 14 pg/mL (ref 6–50)

## 2014-04-09 ENCOUNTER — Ambulatory Visit
Admission: RE | Admit: 2014-04-09 | Discharge: 2014-04-09 | Disposition: A | Discharge status: Home / Self Care | Payer: BLUE CROSS/BLUE SHIELD | Source: Ambulatory Visit | Attending: Internal Medicine | Admitting: Internal Medicine

## 2014-04-09 DIAGNOSIS — E230 Hypopituitarism: Secondary | ICD-10-CM

## 2014-04-09 LAB — GROWTH HORMONE: Growth Hormone: 0.7 ng/mL (ref <=7.1)

## 2014-04-09 MED ORDER — GADOBENATE DIMEGLUMINE 529 MG/ML IV SOLN: 10.0000 mL | Freq: Once | INTRAVENOUS | Status: AC | PRN

## 2014-04-10 LAB — INSULIN-LIKE GROWTH FACTOR
IGF-I, LC/MS: 137 ng/mL (ref 50–317)
Z-SCORE (MALE): 0 {STDV} (ref ?–2.0)

## 2014-04-15 MED ORDER — TESTOSTERONE 50 MG/5GM (1%) TD GEL: 5.0000 g | Freq: Every day | TRANSDERMAL | Status: DC

## 2014-05-06 ENCOUNTER — Other Ambulatory Visit: Payer: Self-pay | Admitting: Medical Oncology

## 2014-05-06 DIAGNOSIS — R7989 Other specified abnormal findings of blood chemistry: Secondary | ICD-10-CM

## 2014-05-07 ENCOUNTER — Ambulatory Visit: Payer: BLUE CROSS/BLUE SHIELD

## 2014-05-07 ENCOUNTER — Other Ambulatory Visit (HOSPITAL_BASED_OUTPATIENT_CLINIC_OR_DEPARTMENT_OTHER): Payer: BLUE CROSS/BLUE SHIELD

## 2014-05-07 ENCOUNTER — Other Ambulatory Visit: Payer: BLUE CROSS/BLUE SHIELD

## 2014-05-07 ENCOUNTER — Ambulatory Visit (HOSPITAL_BASED_OUTPATIENT_CLINIC_OR_DEPARTMENT_OTHER): Payer: BLUE CROSS/BLUE SHIELD | Admitting: Internal Medicine

## 2014-05-07 ENCOUNTER — Telehealth: Payer: Self-pay | Admitting: Internal Medicine

## 2014-05-07 ENCOUNTER — Other Ambulatory Visit: Payer: Self-pay | Admitting: Medical Oncology

## 2014-05-07 ENCOUNTER — Encounter: Payer: Self-pay | Admitting: Internal Medicine

## 2014-05-07 VITALS — BP 140/78 | HR 76 | Temp 98.3°F | Resp 18 | Ht 66.0 in | Wt 191.4 lb

## 2014-05-07 DIAGNOSIS — I10 Essential (primary) hypertension: Secondary | ICD-10-CM

## 2014-05-07 DIAGNOSIS — Z720 Tobacco use: Secondary | ICD-10-CM

## 2014-05-07 DIAGNOSIS — R7989 Other specified abnormal findings of blood chemistry: Secondary | ICD-10-CM

## 2014-05-07 DIAGNOSIS — D72829 Elevated white blood cell count, unspecified: Secondary | ICD-10-CM

## 2014-05-07 LAB — COMPREHENSIVE METABOLIC PANEL (CC13)
ALT: 89 U/L — AB (ref 0–55)
ANION GAP: 12 meq/L — AB (ref 3–11)
AST: 39 U/L — ABNORMAL HIGH (ref 5–34)
Albumin: 3.6 g/dL (ref 3.5–5.0)
Alkaline Phosphatase: 165 U/L — ABNORMAL HIGH (ref 40–150)
BUN: 9.2 mg/dL (ref 7.0–26.0)
CALCIUM: 9.1 mg/dL (ref 8.4–10.4)
CHLORIDE: 108 meq/L (ref 98–109)
CO2: 20 meq/L — AB (ref 22–29)
CREATININE: 0.7 mg/dL (ref 0.7–1.3)
GLUCOSE: 117 mg/dL (ref 70–140)
Potassium: 4.1 mEq/L (ref 3.5–5.1)
Sodium: 140 mEq/L (ref 136–145)
Total Bilirubin: 0.23 mg/dL (ref 0.20–1.20)
Total Protein: 7 g/dL (ref 6.4–8.3)

## 2014-05-07 LAB — CBC WITH DIFFERENTIAL/PLATELET
BASO%: 1 % (ref 0.0–2.0)
BASOS ABS: 0.1 10*3/uL (ref 0.0–0.1)
EOS%: 2.6 % (ref 0.0–7.0)
Eosinophils Absolute: 0.4 10*3/uL (ref 0.0–0.5)
HCT: 41 % (ref 38.4–49.9)
HEMOGLOBIN: 13 g/dL (ref 13.0–17.1)
LYMPH#: 4.1 10*3/uL — AB (ref 0.9–3.3)
LYMPH%: 29.3 % (ref 14.0–49.0)
MCH: 29 pg (ref 27.2–33.4)
MCHC: 31.6 g/dL — ABNORMAL LOW (ref 32.0–36.0)
MCV: 91.7 fL (ref 79.3–98.0)
MONO#: 0.9 10*3/uL (ref 0.1–0.9)
MONO%: 6.5 % (ref 0.0–14.0)
NEUT%: 60.6 % (ref 39.0–75.0)
NEUTROS ABS: 8.4 10*3/uL — AB (ref 1.5–6.5)
Platelets: 353 10*3/uL (ref 140–400)
RBC: 4.47 10*6/uL (ref 4.20–5.82)
RDW: 13.5 % (ref 11.0–14.6)
WBC: 13.9 10*3/uL — ABNORMAL HIGH (ref 4.0–10.3)

## 2014-05-07 NOTE — Telephone Encounter (Signed)
Gave avs & calendar for June. Sent patient for labs

## 2014-05-07 NOTE — Progress Notes (Signed)
Checked in new pt with no financial concerns at this time.  Pt has my card for any billing questions or concerns. °

## 2014-05-07 NOTE — Progress Notes (Signed)
[facility] Telephone:([phone]   [phone]  CONSULT NOTE  REFERRING PHYSICIAN: Dr. Leighton Ruff  REASON FOR CONSULTATION:  54 years old African-American male with elevated white blood count  HPI Henry Schmitt is a 54 y.o. male with past medical history significant for hypertension, GERD, hypogonadism as well as gynecomastia. The patient was seen recently by his primary care physician and CBC performed on 02/06/2014 showed elevated white blood count of 12.5 with elevated lymphocyte count of 4300. Repeat CBC on 03/27/2014 showed persistent elevation of the white blood count at 13.6 and absolute lymphocyte count of 5500. The patient has normal hemoglobin and hematocrit as well as normal platelets count. He was referred to me today for evaluation and recommendation regarding his condition. Reviewing his CBC from 01/10/2008 showed elevated white blood count of 12.7 but he has normal absolute lymphocyte count at that time.  The patient has hypogonadism but he has not started androgen treatment yet. He denied having any recurrent infection. The patient denied having any significant weight loss or night sweats. He has no palpable lymphadenopathy. He has no bleeding, bruises or ecchymosis. Review of systems today he has no significant chest pain, shortness breath, cough or hemoptysis. He denied having any significant nausea or vomiting, no fever or chills. Family history significant for a mother with congestive heart failure and father is still alive and healthy. The patient is single and has no children. He used to work as a Scientist, clinical (histocompatibility and immunogenetics). He has a history of smoking less than one pack per day for around 40 years and unfortunately he continues to smoke. He has no history of alcohol or drug abuse. HPI  Past Medical History  Diagnosis Date  . Hypertension     Past Surgical History  Procedure Laterality Date  . Cholecystectomy      Family History  Problem  Relation Age of Onset  . Heart failure Mother   . Hypertension Mother   . Diabetes Other     Social History History  Substance Use Topics  . Smoking status: Current Every Day Smoker -- 0.50 packs/day  . Smokeless tobacco: Not on file  . Alcohol Use: No    Allergies  Allergen Reactions  . Antihistamines, Chlorpheniramine-Type Hives and Swelling    Current Outpatient Prescriptions  Medication Sig Dispense Refill  . magnesium (MAGTAB) 84 MG (7MEQ) TBCR SR tablet Take 84 mg by mouth daily.    . Multiple Vitamin (MULTIVITAMIN) tablet Take 1 tablet by mouth daily.    Marland Kitchen OVER THE COUNTER MEDICATION Take 1 tablet by mouth daily.    Marland Kitchen testosterone (ANDROGEL) 50 MG/5GM (1%) GEL Place 5 g onto the skin daily. (Patient not taking: Reported on 05/07/2014) 150 g 5   No current facility-administered medications for this visit.    Review of Systems  Constitutional: negative Eyes: negative Ears, nose, mouth, throat, and face: negative Respiratory: negative Cardiovascular: negative Gastrointestinal: negative Genitourinary:negative Integument/breast: negative Hematologic/lymphatic: negative Musculoskeletal:negative Neurological: negative Behavioral/Psych: negative Endocrine: negative Allergic/Immunologic: negative  Physical Exam  LFY:BOFBP, healthy, no distress, well nourished and well developed SKIN: skin color, texture, turgor are normal, no rashes or significant lesions HEAD: Normocephalic, No masses, lesions, tenderness or abnormalities EYES: normal, PERRLA EARS: External ears normal, Canals clear OROPHARYNX:no exudate, no erythema and lips, buccal mucosa, and tongue normal  NECK: supple, no adenopathy, no JVD LYMPH:  no palpable lymphadenopathy, no hepatosplenomegaly LUNGS: clear to auscultation , and palpation HEART: regular rate & rhythm, no murmurs and no  gallops ABDOMEN:abdomen soft, non-tender, normal bowel sounds and no masses or organomegaly BACK: Back symmetric, no  curvature., No CVA tenderness EXTREMITIES:no joint deformities, effusion, or inflammation, no edema, no skin discoloration  NEURO: alert & oriented x 3 with fluent speech, no focal motor/sensory deficits  PERFORMANCE STATUS: ECOG 0  LABORATORY DATA: Lab Results  Component Value Date   WBC 13.9* 05/07/2014   HGB 13.0 05/07/2014   HCT 41.0 05/07/2014   MCV 91.7 05/07/2014   PLT 353 05/07/2014      Chemistry      Component Value Date/Time   NA 140 05/07/2014 1054   NA 138 03/04/2008 0945   K 4.1 05/07/2014 1054   K 3.8 03/04/2008 0945   CL 108 03/04/2008 0945   CO2 20* 05/07/2014 1054   CO2 25 03/04/2008 0945   BUN 9.2 05/07/2014 1054   BUN 12 03/04/2008 0945   CREATININE 0.7 05/07/2014 1054   CREATININE 0.60 03/04/2008 0945      Component Value Date/Time   CALCIUM 9.1 05/07/2014 1054   CALCIUM 8.9 03/04/2008 0945   ALKPHOS 165* 05/07/2014 1054   ALKPHOS 116 03/23/2014 1036   AST 39* 05/07/2014 1054   AST 19 03/23/2014 1036   ALT 89* 05/07/2014 1054   ALT 26 03/23/2014 1036   BILITOT 0.23 05/07/2014 1054   BILITOT 0.2 03/23/2014 1036       RADIOGRAPHIC STUDIES: Mr Jeri Cos Wo Contrast  2014-04-29   CLINICAL DATA:  54 year old male with low testosterone. Tender breasts. Hypertension. Initial encounter.  EXAM: MRI HEAD WITHOUT AND WITH CONTRAST  TECHNIQUE: Multiplanar, multiecho pulse sequences of the brain and surrounding structures were obtained without and with intravenous contrast.  CONTRAST:  10 cc MultiHance.  COMPARISON:  None.  FINDINGS: Slightly heterogeneous appearance of the pituitary gland with minimal deviation of the infundibulum to left. No definitive evidence of pituitary microadenoma.  No acute infarct.  No intracranial hemorrhage.  No hydrocephalus.  Mild punctate and patchy white matter type changes probably related to result of small vessel disease.  No intracranial enhancing lesion/ mass.  Mild exophthalmos.  Major intracranial vascular structures are  patent.  Minimal mucosal thickening ethmoid sinuses and maxillary sinuses.  IMPRESSION: No pituitary microadenoma identified.  Mild small vessel disease type changes.  Please see above.   Electronically Signed   By: Genia Del M.D.   On: Apr 29, 2014 11:25    ASSESSMENT: This is a very pleasant 54 years old African-American male with mild leukocytosis that has been going on for years questionable reactive in nature versus mild low proliferative disorder. He has a normal white blood count differential today except for a elevated absolute neutrophil count. He has no other cytopenias or other abnormality in his blood count.   PLAN: I had a lengthy discussion with the patient today about his condition. I ordered studies today for evaluation of his condition including repeat CBC, comprehensive metabolic panel, LDH as well as molecular study for BCR/ABL. I will arrange for the patient to come back for follow-up visit in one month's for reevaluation and discussion of his lab results and further recommendation regarding his condition. He is currently asymptomatic and will continue on observation for now. The patient voices understanding of current disease status and treatment options and is in agreement with the current care plan.  All questions were answered. The patient knows to call the clinic with any problems, questions or concerns. We can certainly see the patient much sooner if necessary.  Thank you  so much for allowing me to participate in the care of Henry Schmitt. I will continue to follow up the patient with you and assist in his care.  I spent 40 minutes counseling the patient face to face. The total time spent in the appointment was 60 minutes.  Disclaimer: This note was dictated with voice recognition software. Similar sounding words can inadvertently be transcribed and may not be corrected upon review.   Henry Schmitt K. May 07, 2014, 12:09 PM

## 2014-06-11 ENCOUNTER — Other Ambulatory Visit: Payer: BLUE CROSS/BLUE SHIELD

## 2014-06-11 ENCOUNTER — Ambulatory Visit: Payer: BLUE CROSS/BLUE SHIELD | Admitting: Internal Medicine

## 2014-06-18 ENCOUNTER — Ambulatory Visit: Payer: BLUE CROSS/BLUE SHIELD | Admitting: Internal Medicine

## 2014-06-18 DIAGNOSIS — Z0289 Encounter for other administrative examinations: Secondary | ICD-10-CM

## 2014-10-07 ENCOUNTER — Ambulatory Visit: Payer: BLUE CROSS/BLUE SHIELD | Admitting: Internal Medicine

## 2015-04-17 DIAGNOSIS — R109 Unspecified abdominal pain: Secondary | ICD-10-CM | POA: Diagnosis not present

## 2015-04-17 DIAGNOSIS — I776 Arteritis, unspecified: Principal | ICD-10-CM | POA: Diagnosis present

## 2015-04-17 DIAGNOSIS — I10 Essential (primary) hypertension: Secondary | ICD-10-CM | POA: Diagnosis present

## 2015-04-17 DIAGNOSIS — K5900 Constipation, unspecified: Secondary | ICD-10-CM | POA: Diagnosis not present

## 2015-04-17 DIAGNOSIS — Z8619 Personal history of other infectious and parasitic diseases: Secondary | ICD-10-CM

## 2015-04-17 DIAGNOSIS — R112 Nausea with vomiting, unspecified: Secondary | ICD-10-CM | POA: Diagnosis present

## 2015-04-17 DIAGNOSIS — J111 Influenza due to unidentified influenza virus with other respiratory manifestations: Secondary | ICD-10-CM | POA: Diagnosis present

## 2015-04-17 DIAGNOSIS — Z23 Encounter for immunization: Secondary | ICD-10-CM

## 2015-04-17 DIAGNOSIS — D7589 Other specified diseases of blood and blood-forming organs: Secondary | ICD-10-CM | POA: Diagnosis present

## 2015-04-17 DIAGNOSIS — F17200 Nicotine dependence, unspecified, uncomplicated: Secondary | ICD-10-CM | POA: Diagnosis present

## 2015-04-18 ENCOUNTER — Emergency Department (HOSPITAL_COMMUNITY): Payer: Managed Care, Other (non HMO)

## 2015-04-18 ENCOUNTER — Inpatient Hospital Stay (HOSPITAL_COMMUNITY)
Admission: EM | Admit: 2015-04-18 | Discharge: 2015-04-21 | DRG: 547 | Disposition: A | Discharge status: Home / Self Care | Payer: Managed Care, Other (non HMO) | Attending: Internal Medicine | Admitting: Internal Medicine

## 2015-04-18 ENCOUNTER — Encounter (HOSPITAL_COMMUNITY): Payer: Self-pay | Admitting: Oncology

## 2015-04-18 DIAGNOSIS — F17200 Nicotine dependence, unspecified, uncomplicated: Secondary | ICD-10-CM | POA: Diagnosis present

## 2015-04-18 DIAGNOSIS — R109 Unspecified abdominal pain: Secondary | ICD-10-CM | POA: Diagnosis present

## 2015-04-18 DIAGNOSIS — D72829 Elevated white blood cell count, unspecified: Secondary | ICD-10-CM | POA: Diagnosis not present

## 2015-04-18 DIAGNOSIS — I10 Essential (primary) hypertension: Secondary | ICD-10-CM

## 2015-04-18 DIAGNOSIS — R748 Abnormal levels of other serum enzymes: Secondary | ICD-10-CM

## 2015-04-18 DIAGNOSIS — D473 Essential (hemorrhagic) thrombocythemia: Secondary | ICD-10-CM

## 2015-04-18 DIAGNOSIS — J111 Influenza due to unidentified influenza virus with other respiratory manifestations: Secondary | ICD-10-CM | POA: Diagnosis present

## 2015-04-18 DIAGNOSIS — I776 Arteritis, unspecified: Secondary | ICD-10-CM

## 2015-04-18 DIAGNOSIS — D7589 Other specified diseases of blood and blood-forming organs: Secondary | ICD-10-CM | POA: Diagnosis present

## 2015-04-18 DIAGNOSIS — R112 Nausea with vomiting, unspecified: Secondary | ICD-10-CM | POA: Diagnosis present

## 2015-04-18 DIAGNOSIS — K5900 Constipation, unspecified: Secondary | ICD-10-CM | POA: Diagnosis not present

## 2015-04-18 DIAGNOSIS — Z8619 Personal history of other infectious and parasitic diseases: Secondary | ICD-10-CM | POA: Diagnosis not present

## 2015-04-18 DIAGNOSIS — Z23 Encounter for immunization: Secondary | ICD-10-CM | POA: Diagnosis not present

## 2015-04-18 LAB — CBC
HEMATOCRIT: 37.4 % — AB (ref 39.0–52.0)
HEMOGLOBIN: 12.2 g/dL — AB (ref 13.0–17.0)
MCH: 29 pg (ref 26.0–34.0)
MCHC: 32.6 g/dL (ref 30.0–36.0)
MCV: 89 fL (ref 78.0–100.0)
Platelets: 552 10*3/uL — ABNORMAL HIGH (ref 150–400)
RBC: 4.2 MIL/uL — AB (ref 4.22–5.81)
RDW: 12.9 % (ref 11.5–15.5)
WBC: 15.1 10*3/uL — ABNORMAL HIGH (ref 4.0–10.5)

## 2015-04-18 LAB — COMPREHENSIVE METABOLIC PANEL
ALT: 146 U/L — AB (ref 17–63)
ANION GAP: 7 (ref 5–15)
AST: 54 U/L — ABNORMAL HIGH (ref 15–41)
Albumin: 3.8 g/dL (ref 3.5–5.0)
Alkaline Phosphatase: 239 U/L — ABNORMAL HIGH (ref 38–126)
BILIRUBIN TOTAL: 0.3 mg/dL (ref 0.3–1.2)
BUN: 12 mg/dL (ref 6–20)
CALCIUM: 9 mg/dL (ref 8.9–10.3)
CHLORIDE: 106 mmol/L (ref 101–111)
CO2: 27 mmol/L (ref 22–32)
Creatinine, Ser: 0.69 mg/dL (ref 0.61–1.24)
GLUCOSE: 172 mg/dL — AB (ref 65–99)
POTASSIUM: 3.5 mmol/L (ref 3.5–5.1)
Sodium: 140 mmol/L (ref 135–145)
Total Protein: 7.7 g/dL (ref 6.5–8.1)

## 2015-04-18 LAB — URINE MICROSCOPIC-ADD ON: Squamous Epithelial / LPF: NONE SEEN

## 2015-04-18 LAB — URINALYSIS, ROUTINE W REFLEX MICROSCOPIC
Glucose, UA: NEGATIVE mg/dL
HGB URINE DIPSTICK: NEGATIVE
Ketones, ur: NEGATIVE mg/dL
Leukocytes, UA: NEGATIVE
Nitrite: NEGATIVE
PH: 6.5 (ref 5.0–8.0)
Protein, ur: 30 mg/dL — AB
SPECIFIC GRAVITY, URINE: 1.04 — AB (ref 1.005–1.030)

## 2015-04-18 LAB — HIV ANTIBODY (ROUTINE TESTING W REFLEX): HIV SCREEN 4TH GENERATION: NONREACTIVE

## 2015-04-18 LAB — RPR: RPR Ser Ql: NONREACTIVE

## 2015-04-18 LAB — LIPASE, BLOOD: Lipase: 24 U/L (ref 11–51)

## 2015-04-18 MED ORDER — SODIUM CHLORIDE 0.9 % IV BOLUS (SEPSIS)
1000.0000 mL | Freq: Once | INTRAVENOUS | Status: AC
  Administered 2015-04-18: 1000 mL via INTRAVENOUS

## 2015-04-18 MED ORDER — ACETAMINOPHEN 650 MG RE SUPP: 650.0000 mg | Freq: Four times a day (QID) | RECTAL | Status: DC | PRN

## 2015-04-18 MED ORDER — PNEUMOCOCCAL VAC POLYVALENT 25 MCG/0.5ML IJ INJ
0.5000 mL | INJECTION | INTRAMUSCULAR | Status: AC
  Administered 2015-04-19: 0.5 mL via INTRAMUSCULAR
  Filled 2015-04-18 (×2): qty 0.5

## 2015-04-18 MED ORDER — DM-GUAIFENESIN ER 30-600 MG PO TB12
2.0000 | ORAL_TABLET | Freq: Two times a day (BID) | ORAL | Status: DC
  Administered 2015-04-18 – 2015-04-20 (×5): 2 via ORAL
  Filled 2015-04-18 (×3): qty 2
  Filled 2015-04-18: qty 1
  Filled 2015-04-18: qty 2

## 2015-04-18 MED ORDER — ACETAMINOPHEN 325 MG PO TABS
650.0000 mg | ORAL_TABLET | Freq: Four times a day (QID) | ORAL | Status: DC | PRN
Start: 2015-04-18 — End: 2015-04-21
  Administered 2015-04-18 – 2015-04-19 (×3): 650 mg via ORAL
  Filled 2015-04-18 (×3): qty 2

## 2015-04-18 MED ORDER — MAGNESIUM CHLORIDE 64 MG PO TBEC
1.0000 | DELAYED_RELEASE_TABLET | Freq: Every day | ORAL | Status: DC
  Administered 2015-04-19 – 2015-04-20 (×2): 64 mg via ORAL
  Filled 2015-04-18 (×4): qty 1

## 2015-04-18 MED ORDER — IOHEXOL 300 MG/ML  SOLN
25.0000 mL | Freq: Once | INTRAMUSCULAR | Status: AC | PRN
  Administered 2015-04-18: 25 mL via ORAL

## 2015-04-18 MED ORDER — GUAIFENESIN 100 MG/5ML PO SOLN
400.0000 mg | Freq: Three times a day (TID) | ORAL | Status: DC | PRN
  Administered 2015-04-19: 400 mg via ORAL
  Filled 2015-04-18: qty 20

## 2015-04-18 MED ORDER — MORPHINE SULFATE (PF) 2 MG/ML IV SOLN
2.0000 mg | INTRAVENOUS | Status: DC | PRN
  Administered 2015-04-18 – 2015-04-21 (×6): 2 mg via INTRAVENOUS
  Filled 2015-04-18 (×6): qty 1

## 2015-04-18 MED ORDER — SODIUM CHLORIDE 0.9% FLUSH
3.0000 mL | Freq: Two times a day (BID) | INTRAVENOUS | Status: DC
  Administered 2015-04-18 – 2015-04-20 (×4): 3 mL via INTRAVENOUS

## 2015-04-18 MED ORDER — IOPAMIDOL (ISOVUE-300) INJECTION 61%
100.0000 mL | Freq: Once | INTRAVENOUS | Status: AC | PRN
  Administered 2015-04-18: 100 mL via INTRAVENOUS

## 2015-04-18 MED ORDER — ONDANSETRON HCL 4 MG PO TABS: 4.0000 mg | ORAL_TABLET | Freq: Four times a day (QID) | ORAL | Status: DC | PRN

## 2015-04-18 MED ORDER — ONDANSETRON HCL 4 MG/2ML IJ SOLN
4.0000 mg | Freq: Four times a day (QID) | INTRAMUSCULAR | Status: DC | PRN
  Filled 2015-04-18: qty 2

## 2015-04-18 MED ORDER — HYDRALAZINE HCL 20 MG/ML IJ SOLN
10.0000 mg | Freq: Four times a day (QID) | INTRAMUSCULAR | Status: DC | PRN
  Administered 2015-04-18 – 2015-04-20 (×5): 10 mg via INTRAVENOUS
  Filled 2015-04-18 (×5): qty 1

## 2015-04-18 MED ORDER — ENOXAPARIN SODIUM 40 MG/0.4ML ~~LOC~~ SOLN
40.0000 mg | SUBCUTANEOUS | Status: DC
  Administered 2015-04-18 – 2015-04-20 (×3): 40 mg via SUBCUTANEOUS
  Filled 2015-04-18 (×3): qty 0.4

## 2015-04-18 MED ORDER — SODIUM CHLORIDE 0.9 % IV SOLN
INTRAVENOUS | Status: DC
  Administered 2015-04-18: 13:00:00 via INTRAVENOUS

## 2015-04-18 NOTE — ED Notes (Signed)
He is awake, alert and comfortable,, having just ambulated to b.r. And back without difficulty.  He has no c/o at this time.  2nd blood culture drawn at this time per report from night nurse.

## 2015-04-18 NOTE — Progress Notes (Signed)
This is a no charge note   55 year old lady with past medical history of hypertension, who presents with nausea, vomiting, abdominal pain for 4 days. Recent flulike symptoms. 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 EDp discussed with vascular surgery, dr. Doren Custard who does not think they need to be involved, he will review the film. Please call back as needed. WBC 15.1, temperature normal, blood pressure 182/89, electrolytes and renal function okay, negative urinalysis. Pt is accepted to tele bed.   Ivor Costa, MD  [facility] Pager [phone]  If 7PM-7AM, please contact night-coverage [email or web address] Password [facility] 04/18/2015, 6:35 AM

## 2015-04-18 NOTE — ED Notes (Signed)
Patient returned from CT, ambulated to restroom.

## 2015-04-18 NOTE — ED Notes (Signed)
Pt c/o lower abdominal pain since Wednesday.  +N/V.  2 episodes of emesis since Wednesday.  Pt recently dx w/ the flu and is still recuperating from that.  Rates pain 6/10, discomfort.

## 2015-04-18 NOTE — ED Provider Notes (Signed)
CSN: [id number]     Arrival date & time 04/17/15  2328 History   First MD Initiated Contact with Patient 04/18/15 0059     Chief Complaint  Patient presents with  . Abdominal Pain     (Consider location/radiation/quality/duration/timing/severity/associated sxs/prior Treatment) The history is provided by the patient and medical records. No language interpreter was used.     Henry Schmitt is a 55 y.o. male  with a hx of HTN (unmedicated) presents to the Emergency Department complaining of gradual, persistent, progressively worsening lower abd pain onset 3 days ago.  Pt reports slightly worse on the right side, but radiates into the LLQ.  Pt reports no previous abd surgeries.  He reports last BM was this afternoon and it was smaller BM.  NO diarrhea.  Associated symptoms include Nausea and vomiting x2 yesterday and chills throughout the week.  NBNB emesis.  Pt reports no EtOH or street drugs.  Pt smokes 1/2 PPD.  Nothing makes it better and nothing makes it worse.  Pt denies fever, headache, neck pain, chest pain, SOB, weakness, dizziness, syncope.  Pt reports no treatments PTA.      Past Medical History  Diagnosis Date  . Hypertension    Past Surgical History  Procedure Laterality Date  . Cholecystectomy     Family History  Problem Relation Age of Onset  . Heart failure Mother   . Hypertension Mother   . Diabetes Other    Social History  Substance Use Topics  . Smoking status: Current Every Day Smoker -- 0.50 packs/day  . Smokeless tobacco: Never Used  . Alcohol Use: No    Review of Systems  Constitutional: Negative for fever, diaphoresis, appetite change, fatigue and unexpected weight change.  HENT: Negative for mouth sores.   Eyes: Negative for visual disturbance.  Respiratory: Negative for cough, chest tightness, shortness of breath and wheezing.   Cardiovascular: Negative for chest pain.  Gastrointestinal: Positive for nausea, vomiting and abdominal pain. Negative for  diarrhea and constipation.  Endocrine: Negative for polydipsia, polyphagia and polyuria.  Genitourinary: Negative for dysuria, urgency, frequency and hematuria.  Musculoskeletal: Negative for back pain and neck stiffness.  Skin: Negative for rash.  Allergic/Immunologic: Negative for immunocompromised state.  Neurological: Negative for syncope, light-headedness and headaches.  Hematological: Does not bruise/bleed easily.  Psychiatric/Behavioral: Negative for sleep disturbance. The patient is not nervous/anxious.       Allergies  Antihistamines, chlorpheniramine-type  Home Medications   Prior to Admission medications   Medication Sig Start Date End Date Taking? Authorizing Provider  guaiFENesin (ROBITUSSIN) 100 MG/5ML liquid Take 400 mg by mouth 3 (three) times daily as needed for cough.   Yes Historical Provider, MD  ibuprofen (ADVIL,MOTRIN) 200 MG tablet Take 600 mg by mouth every 6 (six) hours as needed for fever, headache or moderate pain.   Yes Historical Provider, MD  magnesium (MAGTAB) 84 MG (7MEQ) TBCR SR tablet Take 84 mg by mouth daily.   Yes Historical Provider, MD  oseltamivir (TAMIFLU) 75 MG capsule Take 75 mg by mouth daily.  04/05/15  Yes Historical Provider, MD  testosterone (ANDROGEL) 50 MG/5GM (1%) GEL Place 5 g onto the skin daily. Patient not taking: Reported on 05/07/2014 04/15/14   Philemon Kingdom, MD   BP 182/89 mmHg  Pulse 70  Temp(Src) 98.5 F (36.9 C) (Oral)  Resp 19  Ht 5\' 6"  (1.676 m)  Wt 83.915 kg  BMI 29.87 kg/m2  SpO2 97% Physical Exam  Constitutional: He appears well-developed and well-nourished.  No distress.  Awake, alert, nontoxic appearance  HENT:  Head: Normocephalic and atraumatic.  Mouth/Throat: Oropharynx is clear and moist. No oropharyngeal exudate.  Eyes: Conjunctivae are normal. No scleral icterus.  Neck: Normal range of motion. Neck supple.  Cardiovascular: Normal rate, regular rhythm and intact distal pulses.   Pulmonary/Chest:  Effort normal and breath sounds normal. No respiratory distress. He has no wheezes.  Equal chest expansion  Abdominal: Soft. Bowel sounds are normal. He exhibits no distension and no mass. There is tenderness in the right lower quadrant. There is no rebound, no guarding and no CVA tenderness.  Musculoskeletal: Normal range of motion. He exhibits no edema.  Neurological: He is alert.  Speech is clear and goal oriented Moves extremities without ataxia  Skin: Skin is warm and dry. He is not diaphoretic.  Psychiatric: He has a normal mood and affect.  Nursing note and vitals reviewed.   ED Course  Procedures (including critical care time) Labs Review Labs Reviewed  COMPREHENSIVE METABOLIC PANEL - Abnormal; Notable for the following:    Glucose, Bld 172 (*)    AST 54 (*)    ALT 146 (*)    Alkaline Phosphatase 239 (*)    All other components within normal limits  CBC - Abnormal; Notable for the following:    WBC 15.1 (*)    RBC 4.20 (*)    Hemoglobin 12.2 (*)    HCT 37.4 (*)    Platelets 552 (*)    All other components within normal limits  URINALYSIS, ROUTINE W REFLEX MICROSCOPIC (NOT AT [facility]) - Abnormal; Notable for the following:    Color, Urine AMBER (*)    Specific Gravity, Urine 1.040 (*)    Bilirubin Urine SMALL (*)    Protein, ur 30 (*)    All other components within normal limits  URINE MICROSCOPIC-ADD ON - Abnormal; Notable for the following:    Bacteria, UA FEW (*)    All other components within normal limits  CULTURE, BLOOD (ROUTINE X 2)  CULTURE, BLOOD (ROUTINE X 2)  LIPASE, BLOOD  HEPATITIS PANEL, ACUTE  RPR  HIV ANTIBODY (ROUTINE TESTING)  GC/CHLAMYDIA PROBE AMP ([place]) NOT AT [facility]    Imaging Review Ct Abdomen Pelvis W Contrast  04/18/2015  CLINICAL DATA:  Lower abdominal pain for 4 days. EXAM: CT ABDOMEN AND PELVIS WITH CONTRAST TECHNIQUE: Multidetector CT imaging of the abdomen and pelvis was performed using the standard protocol following bolus  administration of intravenous contrast. CONTRAST:  141mL ISOVUE-300 IOPAMIDOL (ISOVUE-300) INJECTION 61%, 20mL OMNIPAQUE IOHEXOL 300 MG/ML SOLN COMPARISON:  Ultrasound 02/10/2008 FINDINGS: Lower chest:  No significant abnormality Hepatobiliary: Cholecystectomy. The liver and bile ducts are normal. Pancreas: Normal Spleen: Normal Adrenals/Urinary Tract: The adrenals and kidneys are normal in appearance, with the exception of a 1.3 cm left renal hypodense lesion which is not conclusively characterized but most likely a benign cyst. There is no urinary calculus evident. There is no hydronephrosis or ureteral dilatation. Collecting systems and ureters appear unremarkable. Stomach/Bowel: The stomach, small bowel and colon are remarkable only for mild uncomplicated colonic diverticulosis, and prior sigmoid resection with reanastomosis. Vascular/Lymphatic: There is abnormal inflammatory stranding and soft tissue surrounding the abdominal aorta, from just below the renal arteries to the bifurcation. This is consistent with aortitis. The aorta is normal in caliber. There is mild mural calcification. There is no retroperitoneal hemorrhage. There is no dissection. Reproductive: Unremarkable Other: No adenopathy.  No ascites. Musculoskeletal: No significant abnormality. IMPRESSION: Abnormal soft tissue around  the abdominal aorta, consistent with aortitis. No aneurysm or dissection. Electronically Signed   By: Andreas Newport M.D.   On: 04/18/2015 04:38   I have personally reviewed and evaluated these images and lab results as part of my medical decision-making.   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  Abd is soft without rebound, but pain is more localized in the RLQ.  CT scan with aortitis but no aneurysm or dissection.  Elevated AST and ALT higher than values in 2016. Leukocytosis noted.  Labs otherwise  unremarkable.  Hepatitis panel, RPR, HIV, blood cultures, gonorrhea and chlamydia have all been sent.  6:12 AM Discussed with Vascular surgery, Dr. Doren Custard, who will look at pt's CT scan but recommends medicine admission and consult with ID.    6:26 AM Discussed with Dr. Blaine Hamper who will admit to tele.  BP 182/89 mmHg  Pulse 70  Temp(Src) 98.5 F (36.9 C) (Oral)  Resp 19  Ht 5\' 6"  (1.676 m)  Wt 83.915 kg  BMI 29.87 kg/m2  SpO2 97%   Abigail Butts, PA-C 04/18/15 [id number]  April Palumbo, MD 04/18/15 [phone]

## 2015-04-18 NOTE — H&P (Signed)
[facility] History and Physical  Henry Schmitt [id number] DOB: Oct 27, 1960 DOA: 04/18/2015  Referring physician: Dr. Veatrice Kells, EDP PCP: Gerrit Heck, MD  Specialists:   Chief Complaint: Abdominal pain  HPI: Henry Schmitt is a 55 y.o. male  With a history of hypertension, hypogonadotropic hypogonadism, gynecomastia, who presented to the ER with complaints of abdominal pain that started 3-4 days ago. Complained of nausea/vomiting twice yesterday.  Denies diarrhea.  Nothing seems to make the pain better or worse.  Denies chest pain, shortness of breath. Admits to being diagnosed with the flu 2 weeks ago and was tamiflu.  Does complain of cough. Denies sick contacts or recent travel. Patient denies any fever or chills at home. In the ER, CT abdomen showed aortitis. Patient did have leukocytosis, WBC 15. [facility] called for admission.  Review of Systems:  Constitutional: Denies fever, chills, diaphoresis, appetite change and fatigue.  HEENT: Denies photophobia, eye pain, redness, hearing loss, ear pain, congestion, sore throat, rhinorrhea, sneezing, mouth sores, trouble swallowing, neck pain, neck stiffness and tinnitus.   Respiratory: Complains of cough.   Cardiovascular: Denies chest pain, palpitations and leg swelling.  Gastrointestinal: Abdominal pain. Genitourinary: Denies dysuria, urgency, frequency, hematuria, flank pain and difficulty urinating.  Musculoskeletal: Denies myalgias, back pain, joint swelling, arthralgias and gait problem.  Skin: Denies pallor, rash and wound.  Neurological: Denies dizziness, seizures, syncope, weakness, light-headedness, numbness and headaches.  Hematological: Denies adenopathy. Easy bruising, personal or family bleeding history  Psychiatric/Behavioral: Denies suicidal ideation, mood changes, confusion, nervousness, sleep disturbance and agitation  Past Medical History  Diagnosis Date  . Hypertension    Past Surgical History    Procedure Laterality Date  . Cholecystectomy     Social History:  reports that he has been smoking.  He has never used smokeless tobacco. He reports that he does not drink alcohol or use illicit drugs.  Allergies  Allergen Reactions  . Antihistamines, Chlorpheniramine-Type Hives and Swelling    Family History  Problem Relation Age of Onset  . Heart failure Mother   . Hypertension Mother   . Diabetes Other    Prior to Admission medications   Medication Sig Start Date End Date Taking? Authorizing Provider  guaiFENesin (ROBITUSSIN) 100 MG/5ML liquid Take 400 mg by mouth 3 (three) times daily as needed for cough.   Yes Historical Provider, MD  ibuprofen (ADVIL,MOTRIN) 200 MG tablet Take 600 mg by mouth every 6 (six) hours as needed for fever, headache or moderate pain.   Yes Historical Provider, MD  magnesium (MAGTAB) 84 MG (7MEQ) TBCR SR tablet Take 84 mg by mouth daily.   Yes Historical Provider, MD  oseltamivir (TAMIFLU) 75 MG capsule Take 75 mg by mouth daily.  04/05/15  Yes Historical Provider, MD  testosterone (ANDROGEL) 50 MG/5GM (1%) GEL Place 5 g onto the skin daily. Patient not taking: Reported on 05/07/2014 04/15/14   Philemon Kingdom, MD   Physical Exam: Filed Vitals:   04/18/15 0400 04/18/15 0628  BP: 182/89 172/99  Pulse:  83  Temp:    Resp:  20     General: Well developed, well nourished, NAD, appears stated age  HEENT: NCAT, PERRLA, EOMI, Anicteic Sclera, mucous membranes moist.   Neck: Supple, no JVD, no masses  Cardiovascular: S1 S2 auscultated, no rubs, murmurs or gallops. Regular rate and rhythm.  Respiratory: Clear to auscultation bilaterally, occ cough  Abdomen: Soft, mild TTP RLQ, nondistended, + bowel sounds  Extremities: warm dry without cyanosis clubbing or edema  Neuro: AAOx3,  cranial nerves grossly intact. Strength 5/5 in patient's upper and lower extremities bilaterally  Skin: Without rashes exudates or nodules  Psych: Normal affect and  demeanor with intact judgement and insight  Labs on Admission:  Basic Metabolic Panel:  Recent Labs Lab 04/18/15 0025  NA 140  K 3.5  CL 106  CO2 27  GLUCOSE 172*  BUN 12  CREATININE 0.69  CALCIUM 9.0   Liver Function Tests:  Recent Labs Lab 04/18/15 0025  AST 54*  ALT 146*  ALKPHOS 239*  BILITOT 0.3  PROT 7.7  ALBUMIN 3.8    Recent Labs Lab 04/18/15 0025  LIPASE 24   No results for input(s): AMMONIA in the last 168 hours. CBC:  Recent Labs Lab 04/18/15 0025  WBC 15.1*  HGB 12.2*  HCT 37.4*  MCV 89.0  PLT 552*   Cardiac Enzymes: No results for input(s): CKTOTAL, CKMB, CKMBINDEX, TROPONINI in the last 168 hours.  BNP (last 3 results) No results for input(s): BNP in the last 8760 hours.  ProBNP (last 3 results) No results for input(s): PROBNP in the last 8760 hours.  CBG: No results for input(s): GLUCAP in the last 168 hours.  Radiological Exams on Admission: Ct Abdomen Pelvis W Contrast  04/18/2015  CLINICAL DATA:  Lower abdominal pain for 4 days. EXAM: CT ABDOMEN AND PELVIS WITH CONTRAST TECHNIQUE: Multidetector CT imaging of the abdomen and pelvis was performed using the standard protocol following bolus administration of intravenous contrast. CONTRAST:  167mL ISOVUE-300 IOPAMIDOL (ISOVUE-300) INJECTION 61%, 63mL OMNIPAQUE IOHEXOL 300 MG/ML SOLN COMPARISON:  Ultrasound 02/10/2008 FINDINGS: Lower chest:  No significant abnormality Hepatobiliary: Cholecystectomy. The liver and bile ducts are normal. Pancreas: Normal Spleen: Normal Adrenals/Urinary Tract: The adrenals and kidneys are normal in appearance, with the exception of a 1.3 cm left renal hypodense lesion which is not conclusively characterized but most likely a benign cyst. There is no urinary calculus evident. There is no hydronephrosis or ureteral dilatation. Collecting systems and ureters appear unremarkable. Stomach/Bowel: The stomach, small bowel and colon are remarkable only for mild  uncomplicated colonic diverticulosis, and prior sigmoid resection with reanastomosis. Vascular/Lymphatic: There is abnormal inflammatory stranding and soft tissue surrounding the abdominal aorta, from just below the renal arteries to the bifurcation. This is consistent with aortitis. The aorta is normal in caliber. There is mild mural calcification. There is no retroperitoneal hemorrhage. There is no dissection. Reproductive: Unremarkable Other: No adenopathy.  No ascites. Musculoskeletal: No significant abnormality. IMPRESSION: Abnormal soft tissue around the abdominal aorta, consistent with aortitis. No aneurysm or dissection. Electronically Signed   By: Andreas Newport M.D.   On: 04/18/2015 04:38   EKG: None  Assessment/Plan  Abdominal pain with nausea and vomiting/Elevated LFTs -Unknown etiology -LFTs mildly elevated from 2016 -CT abd/pelvis: Abnormal soft tissue around the abdominal aorta consistent with aortitis, no aneurysm or dissection -Will order Abd [place] -Placed on gentle IVF and continue to monitor CMP -Continue antiemetics  -Hepatitis panel, RPR, HIV pending   ?Aortitis  -Noted on CT abdomen and pelvis -EDP spoke with vascular surgery, Dr. Doren Custard, who recommended ID consultation, no surgical intervention. -Spoke with Dr.Comer, ID, who felt the [facility] inflammation instead of infection. -Will hold off on antibiotics for the time being. -Echocardiogram and blood cultures ordered  Leukocytosis -Likely reactive to aortitis, infection versus inflammation -Patient has had cough, will order chest x-ray -Currently afebrile -Pending blood cultures -Patient denies any urinary symptoms -Will continue to monitor CBC  Thrombocytosis -Possibly due to dehydration versus reactive -  Continue to monitor CBC  Recent influenza with cough -Patient diagnosed with influenza approximately 2 weeks ago by primary doctor -Placed on Tamiflu at that time. -Will placed on antitussives  Essential  hypertension -Currently on no home medications -Will place on hydralazine IV as needed  DVT prophylaxis: Lovenox  Code Status: Full  Condition: Guarded  Family Communication: None at bedside. Admission, patients condition and plan of care including tests being ordered have been discussed with the patient, who indicates understanding and agrees with the plan and Code Status.  Disposition Plan: Admitted.    Time spent: 60 minutes  Janayah Zavada D.O. [facility] Pager [phone]  If 7PM-7AM, please contact night-coverage [email or web address] Password [facility] 04/18/2015, 8:48 AM

## 2015-04-19 ENCOUNTER — Inpatient Hospital Stay (HOSPITAL_COMMUNITY): Payer: Managed Care, Other (non HMO)

## 2015-04-19 DIAGNOSIS — I776 Arteritis, unspecified: Principal | ICD-10-CM

## 2015-04-19 DIAGNOSIS — D696 Thrombocytopenia, unspecified: Secondary | ICD-10-CM

## 2015-04-19 DIAGNOSIS — I10 Essential (primary) hypertension: Secondary | ICD-10-CM

## 2015-04-19 DIAGNOSIS — R109 Unspecified abdominal pain: Secondary | ICD-10-CM

## 2015-04-19 DIAGNOSIS — D72829 Elevated white blood cell count, unspecified: Secondary | ICD-10-CM

## 2015-04-19 LAB — BASIC METABOLIC PANEL
Anion gap: 10 (ref 5–15)
BUN: 7 mg/dL (ref 6–20)
CALCIUM: 8.7 mg/dL — AB (ref 8.9–10.3)
CHLORIDE: 108 mmol/L (ref 101–111)
CO2: 23 mmol/L (ref 22–32)
CREATININE: 0.67 mg/dL (ref 0.61–1.24)
GFR calc non Af Amer: >60 mL/min (ref >60)
Glucose, Bld: 123 mg/dL — ABNORMAL HIGH (ref 65–99)
Potassium: 3.6 mmol/L (ref 3.5–5.1)
SODIUM: 141 mmol/L (ref 135–145)

## 2015-04-19 LAB — ECHOCARDIOGRAM COMPLETE
HEIGHTINCHES: 66.5 in
WEIGHTICAEL: 2892.8 [oz_av]

## 2015-04-19 LAB — CBC
HCT: 35 % — ABNORMAL LOW (ref 39.0–52.0)
Hemoglobin: 11.5 g/dL — ABNORMAL LOW (ref 13.0–17.0)
MCH: 29.1 pg (ref 26.0–34.0)
MCHC: 32.9 g/dL (ref 30.0–36.0)
MCV: 88.6 fL (ref 78.0–100.0)
PLATELETS: 514 10*3/uL — AB (ref 150–400)
RBC: 3.95 MIL/uL — ABNORMAL LOW (ref 4.22–5.81)
RDW: 13 % (ref 11.5–15.5)
WBC: 12.9 10*3/uL — AB (ref 4.0–10.5)

## 2015-04-19 LAB — GC/CHLAMYDIA PROBE AMP (~~LOC~~) NOT AT ARMC
CHLAMYDIA, DNA PROBE: NEGATIVE
NEISSERIA GONORRHEA: NEGATIVE

## 2015-04-19 MED ORDER — HYDRALAZINE HCL 20 MG/ML IJ SOLN
10.0000 mg | Freq: Once | INTRAMUSCULAR | Status: AC
  Administered 2015-04-19: 10 mg via INTRAVENOUS
  Filled 2015-04-19: qty 1

## 2015-04-19 MED ORDER — POLYETHYLENE GLYCOL 3350 17 G PO PACK
17.0000 g | PACK | Freq: Two times a day (BID) | ORAL | Status: DC
  Administered 2015-04-19: 17 g via ORAL
  Filled 2015-04-19: qty 1

## 2015-04-19 NOTE — Progress Notes (Signed)
[facility] PROGRESS NOTE    Progress Note   Henry Schmitt [id number] DOB: 1960-01-30 DOA: 04/18/2015 PCP: Gerrit Heck, MD   Brief Narrative:   Henry Schmitt is an 55 y.o. male past medical history of hypogonadism presents to the ED complaining of abdominal pain that started 3-4 days prior to admission accompanied by nausea and vomiting twice a day.  Assessment/Plan:   Abdominal pain with nausea and vomiting and elevated LFT's: Of unclear etiology, his LFTs are mildly increased compared to 2016. CT of the abdomen and pelvis showed abnormal soft tissue around the aorta consistent with aortitis Abdominal ultrasound showed no specific abnormalities., RPR and HIV are nonreactive. Hepatitis panel is pending. Slightly cause of abdominal pain is aortitis  Leukocytosis/Aortitis ([place]) The case was discussed with infectious disease who was leaning more towards inflammation instead of an infection. ID recommended to continue to hold antibiotics where a 2-D echo and blood cultures, blood cultures have remained negative. 2-D echo is pending. There is likely reactive due to the inflammation. This x-ray shows no infiltrates E has remained afebrile.  Essential hypertension, benign Currently on no medications at home, on hydralazine IV when necessary.  Recent influenza with cough: Continue to monitor. He relates he continues to improve.  Leukocytosis: Likely reactive, now improving.     DVT Prophylaxis - Lovenox ordered.  Family Communication: none Disposition Plan: Home 2 days Code Status:     Code Status Orders        Start     Ordered   04/18/15 1238  Full code   Continuous     04/18/15 1238    Code Status History    Date Active Date Inactive Code Status Order ID Comments User Context   02/25/2011  6:57 AM 02/25/2011  5:11 PM Full Code [id number]  Teressa Lower, MD ED        IV Access:    Peripheral IV   Procedures and diagnostic studies:   US  Abdomen Complete  04-20-15  CLINICAL DATA:  Acute generalized abdominal pain. EXAM: ABDOMEN ULTRASOUND COMPLETE COMPARISON:  CT scan of April 18, 2015. FINDINGS: Gallbladder: Status post cholecystectomy. Common bile duct: Diameter: 8 mm which is within normal limits for post cholecystectomy status. Liver: No focal lesion identified. Within normal limits in parenchymal echogenicity. IVC: No abnormality visualized. Pancreas: Visualized portion unremarkable. Spleen: Size and appearance within normal limits. Right Kidney: Length: 12.5 cm. Echogenicity within normal limits. No mass or hydronephrosis visualized. Left Kidney: Length: 11.3 cm. 1.5 cm simple cyst is seen in upper pole. Echogenicity within normal limits. No mass or hydronephrosis visualized. Abdominal aorta: No aneurysm visualized. Other findings: None. IMPRESSION: Status post cholecystectomy. No significant abnormality seen in the abdomen. Electronically Signed   By: Marijo Conception, M.D.   On: 04-20-2015 09:03   Ct Abdomen Pelvis W Contrast  04/18/2015  CLINICAL DATA:  Lower abdominal pain for 4 days. EXAM: CT ABDOMEN AND PELVIS WITH CONTRAST TECHNIQUE: Multidetector CT imaging of the abdomen and pelvis was performed using the standard protocol following bolus administration of intravenous contrast. CONTRAST:  167mL ISOVUE-300 IOPAMIDOL (ISOVUE-300) INJECTION 61%, 79mL OMNIPAQUE IOHEXOL 300 MG/ML SOLN COMPARISON:  Ultrasound 02/10/2008 FINDINGS: Lower chest:  No significant abnormality Hepatobiliary: Cholecystectomy. The liver and bile ducts are normal. Pancreas: Normal Spleen: Normal Adrenals/Urinary Tract: The adrenals and kidneys are normal in appearance, with the exception of a 1.3 cm left renal hypodense lesion which is not conclusively characterized but most likely a benign cyst. There is no urinary  calculus evident. There is no hydronephrosis or ureteral dilatation. Collecting systems and ureters appear unremarkable. Stomach/Bowel: The stomach,  small bowel and colon are remarkable only for mild uncomplicated colonic diverticulosis, and prior sigmoid resection with reanastomosis. Vascular/Lymphatic: There is abnormal inflammatory stranding and soft tissue surrounding the abdominal aorta, from just below the renal arteries to the bifurcation. This is consistent with aortitis. The aorta is normal in caliber. There is mild mural calcification. There is no retroperitoneal hemorrhage. There is no dissection. Reproductive: Unremarkable Other: No adenopathy.  No ascites. Musculoskeletal: No significant abnormality. IMPRESSION: Abnormal soft tissue around the abdominal aorta, consistent with aortitis. No aneurysm or dissection. Electronically Signed   By: Andreas Newport M.D.   On: 04/18/2015 04:38     Medical Consultants:    None.  Anti-Infectives:   Anti-infectives    None      Subjective:    Henry Schmitt she relates her abdominal pain is improved she is tolerating her diet no further nausea.  Objective:    Filed Vitals:   04/18/15 1232 04/18/15 2131 04/19/15 0237 04/19/15 0751  BP: 176/84 197/89 160/83 157/74  Pulse:  80 80 70  Temp:  98.4 F (36.9 C)  97.5 F (36.4 C)  TempSrc:  Oral  Oral  Resp:  18  17  Height:      Weight:    82.01 kg (180 lb 12.8 oz)  SpO2:  100%  100%    Intake/Output Summary (Last 24 hours) at 04/19/15 1101 Last data filed at 04/19/15 0700  Gross per 24 hour  Intake 2253.75 ml  Output   1950 ml  Net 303.75 ml   Filed Weights   04/18/15 0004 04/18/15 1228 04/19/15 0751  Weight: 83.915 kg (185 lb) 81.784 kg (180 lb 4.8 oz) 82.01 kg (180 lb 12.8 oz)    Exam: Gen:  NAD Cardiovascular:  RRR. Chest and lungs:   CTAB Abdomen:  Abdomen soft, NT/ND, + BS Extremities:  No edema   Data Reviewed:    Labs: Basic Metabolic Panel:  Recent Labs Lab 04/18/15 0025 04/19/15 0421  NA 140 141  K 3.5 3.6  CL 106 108  CO2 27 23  GLUCOSE 172* 123*  BUN 12 7  CREATININE 0.69 0.67    CALCIUM 9.0 8.7*   GFR Estimated Creatinine Clearance: 106 mL/min (by C-G formula based on Cr of 0.67). Liver Function Tests:  Recent Labs Lab 04/18/15 0025  AST 54*  ALT 146*  ALKPHOS 239*  BILITOT 0.3  PROT 7.7  ALBUMIN 3.8    Recent Labs Lab 04/18/15 0025  LIPASE 24   No results for input(s): AMMONIA in the last 168 hours. Coagulation profile No results for input(s): INR, PROTIME in the last 168 hours.  CBC:  Recent Labs Lab 04/18/15 0025 04/19/15 0421  WBC 15.1* 12.9*  HGB 12.2* 11.5*  HCT 37.4* 35.0*  MCV 89.0 88.6  PLT 552* 514*   Cardiac Enzymes: No results for input(s): CKTOTAL, CKMB, CKMBINDEX, TROPONINI in the last 168 hours. BNP (last 3 results) No results for input(s): PROBNP in the last 8760 hours. CBG: No results for input(s): GLUCAP in the last 168 hours. D-Dimer: No results for input(s): DDIMER in the last 72 hours. Hgb A1c: No results for input(s): HGBA1C in the last 72 hours. Lipid Profile: No results for input(s): CHOL, HDL, LDLCALC, TRIG, CHOLHDL, LDLDIRECT in the last 72 hours. Thyroid function studies: No results for input(s): TSH, T4TOTAL, T3FREE, THYROIDAB in the last 72 hours.  Invalid input(s): FREET3 Anemia work up: No results for input(s): VITAMINB12, FOLATE, FERRITIN, TIBC, IRON, RETICCTPCT in the last 72 hours. Sepsis Labs:  Recent Labs Lab 04/18/15 0025 04/19/15 0421  WBC 15.1* 12.9*   Microbiology Recent Results (from the past 240 hour(s))  Blood culture (routine x 2)     Status: None (Preliminary result)   Collection Time: 04/18/15  5:44 AM  Result Value Ref Range Status   Specimen Description   Final    BLOOD RIGHT ANTECUBITAL Performed at [facility]  Final   Culture PENDING  Incomplete   Report Status PENDING  Incomplete  Blood culture (routine x 2)     Status: None (Preliminary result)   Collection Time: 04/18/15  7:40 AM  Result  Value Ref Range Status   Specimen Description   Final    BLOOD LEFT ANTECUBITAL Performed at [facility]    Special Requests BOTTLES DRAWN AEROBIC AND ANAEROBIC 10CC  Final   Culture PENDING  Incomplete   Report Status PENDING  Incomplete     Medications:   . dextromethorphan-guaiFENesin  2 tablet Oral BID  . enoxaparin (LOVENOX) injection  40 mg Subcutaneous Q24H  . magnesium chloride  1 tablet Oral Daily  . pneumococcal 23 valent vaccine  0.5 mL Intramuscular Tomorrow-1000  . sodium chloride flush  3 mL Intravenous Q12H   Continuous Infusions: . sodium chloride 75 mL/hr at 04/18/15 1245    Time spent: 25 min   LOS: 1 day   Charlynne Cousins  [facility] Pager [phone]  *Please refer to [email or web address], password [email or web address] to get updated schedule on who will round on this patient, as hospitalists switch teams weekly. If 7PM-7AM, please contact night-coverage at [email or web address], password [email or web address] for any overnight needs.  04/19/2015, 11:01 AM

## 2015-04-19 NOTE — Progress Notes (Signed)
  Echocardiogram 2D Echocardiogram has been performed.  Henry Schmitt 04/19/2015, 1:45 PM

## 2015-04-20 LAB — HEPATITIS PANEL, ACUTE
Hep A IgM: NEGATIVE
Hep B C IgM: NEGATIVE
Hepatitis B Surface Ag: NEGATIVE

## 2015-04-20 LAB — HEPATIC FUNCTION PANEL
ALK PHOS: 206 U/L — AB (ref 38–126)
ALT: 176 U/L — ABNORMAL HIGH (ref 17–63)
AST: 104 U/L — AB (ref 15–41)
Albumin: 3.5 g/dL (ref 3.5–5.0)
BILIRUBIN DIRECT: 0.1 mg/dL (ref 0.1–0.5)
BILIRUBIN TOTAL: 0.7 mg/dL (ref 0.3–1.2)
Indirect Bilirubin: 0.6 mg/dL (ref 0.3–0.9)
Total Protein: 7.3 g/dL (ref 6.5–8.1)

## 2015-04-20 LAB — BASIC METABOLIC PANEL
Anion gap: 10 (ref 5–15)
BUN: 10 mg/dL (ref 6–20)
CALCIUM: 9.4 mg/dL (ref 8.9–10.3)
CHLORIDE: 110 mmol/L (ref 101–111)
CO2: 24 mmol/L (ref 22–32)
CREATININE: 0.68 mg/dL (ref 0.61–1.24)
GFR calc non Af Amer: >60 mL/min (ref >60)
GLUCOSE: 114 mg/dL — AB (ref 65–99)
Potassium: 3.9 mmol/L (ref 3.5–5.1)
Sodium: 144 mmol/L (ref 135–145)

## 2015-04-20 MED ORDER — POLYETHYLENE GLYCOL 3350 17 G PO PACK
17.0000 g | PACK | Freq: Three times a day (TID) | ORAL | Status: DC
  Administered 2015-04-20 (×3): 17 g via ORAL
  Filled 2015-04-20 (×3): qty 1

## 2015-04-20 NOTE — Progress Notes (Signed)
[facility] PROGRESS NOTE    Progress Note   Henry Schmitt [id number] DOB: 10-Nov-1960 DOA: 04/18/2015 PCP: Gerrit Heck, MD   Brief Narrative:   Henry Schmitt is an 55 y.o. male past medical history of hypogonadism presents to the ED complaining of abdominal pain that started 3-4 days prior to admission accompanied by nausea and vomiting twice a day.  Assessment/Plan:   Abdominal pain with nausea and vomiting and elevated LFT's: His LFTs are improving. CT of the abdomen and pelvis showed abnormal soft tissue around the aorta consistent with aortitis Hepatitis panel is negative. Tolerating diet  Leukocytosis/Aortitis ([place]) The case was discussed with infectious disease who was leaning more towards inflammation instead of an infection. Continue to hold antibiotics  blood cultures have remained negative. 2-D echo no abnormalities. There is likely reactive due to the inflammation. Check ANA.  Essential hypertension, benign Currently on no medications at home, on hydralazine IV when necessary. BP elevated ? Due to pain.  Recent influenza with cough: Continue to monitor. He relates he continues to improve.  Leukocytosis: Likely reactive.  Constipation: Start miralax     DVT Prophylaxis - Lovenox ordered.  Family Communication: none Disposition Plan: Home 2 days Code Status:     Code Status Orders        Start     Ordered   04/18/15 1238  Full code   Continuous     04/18/15 1238    Code Status History    Date Active Date Inactive Code Status Order ID Comments User Context   02/25/2011  6:57 AM 02/25/2011  5:11 PM Full Code [id number]  Teressa Lower, MD ED        IV Access:    Peripheral IV   Procedures and diagnostic studies:   US Abdomen Complete  04/19/2015  CLINICAL DATA:  Acute generalized abdominal pain. EXAM: ABDOMEN ULTRASOUND COMPLETE COMPARISON:  CT scan of April 18, 2015. FINDINGS: Gallbladder: Status post cholecystectomy.  Common bile duct: Diameter: 8 mm which is within normal limits for post cholecystectomy status. Liver: No focal lesion identified. Within normal limits in parenchymal echogenicity. IVC: No abnormality visualized. Pancreas: Visualized portion unremarkable. Spleen: Size and appearance within normal limits. Right Kidney: Length: 12.5 cm. Echogenicity within normal limits. No mass or hydronephrosis visualized. Left Kidney: Length: 11.3 cm. 1.5 cm simple cyst is seen in upper pole. Echogenicity within normal limits. No mass or hydronephrosis visualized. Abdominal aorta: No aneurysm visualized. Other findings: None. IMPRESSION: Status post cholecystectomy. No significant abnormality seen in the abdomen. Electronically Signed   By: Marijo Conception, M.D.   On: 04/19/2015 09:03     Medical Consultants:    None.  Anti-Infectives:   Anti-infectives    None      Subjective:    Henry Schmitt she relates her abdominal pain is resolved, some nausea but no vomiting.  Objective:    Filed Vitals:   04/19/15 1720 04/19/15 2140 04/20/15 0305 04/20/15 0541  BP: 195/83 180/78 167/83 159/76  Pulse: 70 82  76  Temp:  98.2 F (36.8 C)  98.7 F (37.1 C)  TempSrc:  Oral  Oral  Resp:  16  16  Height:      Weight:    81.557 kg (179 lb 12.8 oz)  SpO2:  100%  100%    Intake/Output Summary (Last 24 hours) at 04/20/15 0715 Last data filed at 04/20/15 0600  Gross per 24 hour  Intake 1132.67 ml  Output   1350 ml  Net -  217.33 ml   Filed Weights   04/18/15 1228 04/19/15 0751 04/20/15 0541  Weight: 81.784 kg (180 lb 4.8 oz) 82.01 kg (180 lb 12.8 oz) 81.557 kg (179 lb 12.8 oz)    Exam: Gen:  NAD Cardiovascular:  RRR. Chest and lungs:   CTAB Abdomen:  Abdomen soft, NT/ND, + BS Extremities:  No edema   Data Reviewed:    Labs: Basic Metabolic Panel:  Recent Labs Lab 04/18/15 0025 04/19/15 0421 04/20/15 0453  NA 140 141 144  K 3.5 3.6 3.9  CL 106 108 110  CO2 27 23 24   GLUCOSE 172* 123*  114*  BUN 12 7 10   CREATININE 0.69 0.67 0.68  CALCIUM 9.0 8.7* 9.4   GFR Estimated Creatinine Clearance: 105.7 mL/min (by C-G formula based on Cr of 0.68). Liver Function Tests:  Recent Labs Lab 04/18/15 0025 04/20/15 0453  AST 54* 104*  ALT 146* 176*  ALKPHOS 239* 206*  BILITOT 0.3 0.7  PROT 7.7 7.3  ALBUMIN 3.8 3.5    Recent Labs Lab 04/18/15 0025  LIPASE 24   No results for input(s): AMMONIA in the last 168 hours. Coagulation profile No results for input(s): INR, PROTIME in the last 168 hours.  CBC:  Recent Labs Lab 04/18/15 0025 04/19/15 0421  WBC 15.1* 12.9*  HGB 12.2* 11.5*  HCT 37.4* 35.0*  MCV 89.0 88.6  PLT 552* 514*   Cardiac Enzymes: No results for input(s): CKTOTAL, CKMB, CKMBINDEX, TROPONINI in the last 168 hours. BNP (last 3 results) No results for input(s): PROBNP in the last 8760 hours. CBG: No results for input(s): GLUCAP in the last 168 hours. D-Dimer: No results for input(s): DDIMER in the last 72 hours. Hgb A1c: No results for input(s): HGBA1C in the last 72 hours. Lipid Profile: No results for input(s): CHOL, HDL, LDLCALC, TRIG, CHOLHDL, LDLDIRECT in the last 72 hours. Thyroid function studies: No results for input(s): TSH, T4TOTAL, T3FREE, THYROIDAB in the last 72 hours.  Invalid input(s): FREET3 Anemia work up: No results for input(s): VITAMINB12, FOLATE, FERRITIN, TIBC, IRON, RETICCTPCT in the last 72 hours. Sepsis Labs:  Recent Labs Lab 04/18/15 0025 04/19/15 0421  WBC 15.1* 12.9*   Microbiology Recent Results (from the past 240 hour(s))  Blood culture (routine x 2)     Status: None (Preliminary result)   Collection Time: 04/18/15  5:44 AM  Result Value Ref Range Status   Specimen Description BLOOD RIGHT ANTECUBITAL  Final   Special Requests BOTTLES DRAWN AEROBIC AND ANAEROBIC 5CC  Final   Culture   Final    NO GROWTH 1 DAY Performed at [facility]    Report Status PENDING  Incomplete  Blood culture  (routine x 2)     Status: None (Preliminary result)   Collection Time: 04/18/15  7:40 AM  Result Value Ref Range Status   Specimen Description BLOOD LEFT ANTECUBITAL  Final   Special Requests BOTTLES DRAWN AEROBIC AND ANAEROBIC 10CC  Final   Culture   Final    NO GROWTH < 24 HOURS Performed at [facility]    Report Status PENDING  Incomplete     Medications:   . dextromethorphan-guaiFENesin  2 tablet Oral BID  . enoxaparin (LOVENOX) injection  40 mg Subcutaneous Q24H  . magnesium chloride  1 tablet Oral Daily  . polyethylene glycol  17 g Oral BID  . sodium chloride flush  3 mL Intravenous Q12H   Continuous Infusions: . sodium chloride 10 mL/hr at 04/19/15 1144  Time spent: 15 min   LOS: 2 days   Charlynne Cousins  [facility] Pager [phone]  *Please refer to [email or web address], password [email or web address] to get updated schedule on who will round on this patient, as hospitalists switch teams weekly. If 7PM-7AM, please contact night-coverage at [email or web address], password [email or web address] for any overnight needs.  04/20/2015, 7:15 AM

## 2015-04-21 LAB — CBC
HCT: 37.4 % — ABNORMAL LOW (ref 39.0–52.0)
HEMOGLOBIN: 12 g/dL — AB (ref 13.0–17.0)
MCH: 28.2 pg (ref 26.0–34.0)
MCHC: 32.1 g/dL (ref 30.0–36.0)
MCV: 87.8 fL (ref 78.0–100.0)
Platelets: 542 10*3/uL — ABNORMAL HIGH (ref 150–400)
RBC: 4.26 MIL/uL (ref 4.22–5.81)
RDW: 13.2 % (ref 11.5–15.5)
WBC: 14 10*3/uL — ABNORMAL HIGH (ref 4.0–10.5)

## 2015-04-21 LAB — BASIC METABOLIC PANEL
ANION GAP: 10 (ref 5–15)
BUN: 10 mg/dL (ref 6–20)
CHLORIDE: 106 mmol/L (ref 101–111)
CO2: 23 mmol/L (ref 22–32)
Calcium: 9.3 mg/dL (ref 8.9–10.3)
Creatinine, Ser: 0.61 mg/dL (ref 0.61–1.24)
GFR calc non Af Amer: >60 mL/min (ref >60)
GLUCOSE: 128 mg/dL — AB (ref 65–99)
Potassium: 3.7 mmol/L (ref 3.5–5.1)
Sodium: 139 mmol/L (ref 135–145)

## 2015-04-21 NOTE — Discharge Summary (Signed)
Physician Discharge Summary  Henry Schmitt [id number] DOB: 11-01-1960 DOA: 04/18/2015  PCP: Gerrit Heck, MD  Admit date: 04/18/2015 Discharge date: 04/21/2015  Time spent: 35 minutes  Recommendations for Outpatient Follow-up:  1. Follow-up with primary care doctor in 2-4 weeks for repeat a CT scan of the abdomen and pelvis to reevaluate the abnormal soft tissue around the aorta. 2. Primary care doctor to follow-up on ANA. 3. We'll check her blood pressure and initiate antihypertensive medications if needed.  Discharge Diagnoses:  Active Problems:   Leukocytosis   Aortitis ([facility])   Abdominal pain   Essential hypertension, benign   Discharge Condition: stable  Diet recommendation: regular  Filed Weights   04/19/15 0751 04/20/15 0541 04/21/15 0538  Weight: 82.01 kg (180 lb 12.8 oz) 81.557 kg (179 lb 12.8 oz) 80.513 kg (177 lb 8 oz)    History of present illness:  55 year old with past medical history of hypertension hypogonadism presents to the ED complaining of abdominal pain and nausea and vomiting that started 3 days prior to admission. She had been recently diagnosed with the flu and underwent treatment with Tamiflu.  Hospital Course:  Abdominal pain with nausea and vomiting with an elevation in her LFT's: Of unclear etiology, likely due to inflammatory changes seen on the CT. She was treated conservatively and a CT scan of the abdomen and pelvis was done with results as below with no signs of cholelithiasis or acute cholecystitis. Abdominal ultrasound showed no acute findings. ANA is pending. Hepatitis panel was negative With conservative management her LFTs started to trend down her abdominal pain resolved she was requiring no further narcotics or NSAIDs and she was tolerating her diet.  Leukocytosis/Aortitis: It was discussed with vascular surgery Dr. Scot Dock and infectious disease doctor Dr. Linus Salmons. Recommended observation she was kept off antibiotics and she  remained afebrile, blood cultures were negative, 2-D echo showed no acute abnormalities. Her leukocytosis is likely reactive due to her inflammation. She will need a repeat CT scan of the abdomen and pelvis in 2 weeks to reevaluate the inflammation around the aorta, and will follow-up on her ANA.  Essential hypertension: She was on no medications at home, her blood pressure has been slowly trending down here in the hospital no antihypertensive medications were started.  Recent influenza: She finished her treatment as an outpatient.  Procedures: Echo CT abd and pelvis  Consultations:  None  Discharge Exam: Filed Vitals:   04/20/15 2251 04/21/15 0538  BP: 176/84 154/91  Pulse: 83 86  Temp: 98.3 F (36.8 C) 98 F (36.7 C)  Resp: 16 18    General: A&O x3 Cardiovascular: RRR Respiratory: good air movement CTA B/L  Discharge Instructions   Discharge Instructions    Diet - low sodium heart healthy    Complete by:  As directed      Increase activity slowly    Complete by:  As directed           Current Discharge Medication List    CONTINUE these medications which have NOT CHANGED   Details  guaiFENesin (ROBITUSSIN) 100 MG/5ML liquid Take 400 mg by mouth 3 (three) times daily as needed for cough.    ibuprofen (ADVIL,MOTRIN) 200 MG tablet Take 600 mg by mouth every 6 (six) hours as needed for fever, headache or moderate pain.    magnesium (MAGTAB) 84 MG (7MEQ) TBCR SR tablet Take 84 mg by mouth daily.   Associated Diagnoses: Leukocytosis    oseltamivir (TAMIFLU) 75 MG capsule Take 75  mg by mouth daily.     testosterone (ANDROGEL) 50 MG/5GM (1%) GEL Place 5 g onto the skin daily. Qty: 150 g, Refills: 5       Allergies  Allergen Reactions  . Antihistamines, Chlorpheniramine-Type Hives and Swelling      The results of significant diagnostics from this hospitalization (including imaging, microbiology, ancillary and laboratory) are listed below for reference.     Significant Diagnostic Studies: US Abdomen Complete  04/19/2015  CLINICAL DATA:  Acute generalized abdominal pain. EXAM: ABDOMEN ULTRASOUND COMPLETE COMPARISON:  CT scan of April 18, 2015. FINDINGS: Gallbladder: Status post cholecystectomy. Common bile duct: Diameter: 8 mm which is within normal limits for post cholecystectomy status. Liver: No focal lesion identified. Within normal limits in parenchymal echogenicity. IVC: No abnormality visualized. Pancreas: Visualized portion unremarkable. Spleen: Size and appearance within normal limits. Right Kidney: Length: 12.5 cm. Echogenicity within normal limits. No mass or hydronephrosis visualized. Left Kidney: Length: 11.3 cm. 1.5 cm simple cyst is seen in upper pole. Echogenicity within normal limits. No mass or hydronephrosis visualized. Abdominal aorta: No aneurysm visualized. Other findings: None. IMPRESSION: Status post cholecystectomy. No significant abnormality seen in the abdomen. Electronically Signed   By: Marijo Conception, M.D.   On: 04/19/2015 09:03   Ct Abdomen Pelvis W Contrast  04/18/2015  CLINICAL DATA:  Lower abdominal pain for 4 days. EXAM: CT ABDOMEN AND PELVIS WITH CONTRAST TECHNIQUE: Multidetector CT imaging of the abdomen and pelvis was performed using the standard protocol following bolus administration of intravenous contrast. CONTRAST:  124mL ISOVUE-300 IOPAMIDOL (ISOVUE-300) INJECTION 61%, 40mL OMNIPAQUE IOHEXOL 300 MG/ML SOLN COMPARISON:  Ultrasound 02/10/2008 FINDINGS: Lower chest:  No significant abnormality Hepatobiliary: Cholecystectomy. The liver and bile ducts are normal. Pancreas: Normal Spleen: Normal Adrenals/Urinary Tract: The adrenals and kidneys are normal in appearance, with the exception of a 1.3 cm left renal hypodense lesion which is not conclusively characterized but most likely a benign cyst. There is no urinary calculus evident. There is no hydronephrosis or ureteral dilatation. Collecting systems and ureters appear  unremarkable. Stomach/Bowel: The stomach, small bowel and colon are remarkable only for mild uncomplicated colonic diverticulosis, and prior sigmoid resection with reanastomosis. Vascular/Lymphatic: There is abnormal inflammatory stranding and soft tissue surrounding the abdominal aorta, from just below the renal arteries to the bifurcation. This is consistent with aortitis. The aorta is normal in caliber. There is mild mural calcification. There is no retroperitoneal hemorrhage. There is no dissection. Reproductive: Unremarkable Other: No adenopathy.  No ascites. Musculoskeletal: No significant abnormality. IMPRESSION: Abnormal soft tissue around the abdominal aorta, consistent with aortitis. No aneurysm or dissection. Electronically Signed   By: Andreas Newport M.D.   On: 04/18/2015 04:38    Microbiology: Recent Results (from the past 240 hour(s))  Blood culture (routine x 2)     Status: None (Preliminary result)   Collection Time: 04/18/15  5:44 AM  Result Value Ref Range Status   Specimen Description BLOOD RIGHT ANTECUBITAL  Final   Special Requests BOTTLES DRAWN AEROBIC AND ANAEROBIC 5CC  Final   Culture   Final    NO GROWTH 2 DAYS Performed at [facility]    Report Status PENDING  Incomplete  Blood culture (routine x 2)     Status: None (Preliminary result)   Collection Time: 04/18/15  7:40 AM  Result Value Ref Range Status   Specimen Description BLOOD LEFT ANTECUBITAL  Final   Special Requests BOTTLES DRAWN AEROBIC AND ANAEROBIC 10CC  Final   Culture  Final    NO GROWTH 2 DAYS Performed at [facility]    Report Status PENDING  Incomplete     Labs: Basic Metabolic Panel:  Recent Labs Lab 04/18/15 0025 04/19/15 0421 04/20/15 0453 04/21/15 0447  NA 140 141 144 139  K 3.5 3.6 3.9 3.7  CL 106 108 110 106  CO2 27 23 24 23   GLUCOSE 172* 123* 114* 128*  BUN 12 7 10 10   CREATININE 0.69 0.67 0.68 0.61  CALCIUM 9.0 8.7* 9.4 9.3   Liver Function  Tests:  Recent Labs Lab 04/18/15 0025 04/20/15 0453  AST 54* 104*  ALT 146* 176*  ALKPHOS 239* 206*  BILITOT 0.3 0.7  PROT 7.7 7.3  ALBUMIN 3.8 3.5    Recent Labs Lab 04/18/15 0025  LIPASE 24   No results for input(s): AMMONIA in the last 168 hours. CBC:  Recent Labs Lab 04/18/15 0025 04/19/15 0421 04/21/15 0447  WBC 15.1* 12.9* 14.0*  HGB 12.2* 11.5* 12.0*  HCT 37.4* 35.0* 37.4*  MCV 89.0 88.6 87.8  PLT 552* 514* 542*   Cardiac Enzymes: No results for input(s): CKTOTAL, CKMB, CKMBINDEX, TROPONINI in the last 168 hours. BNP: BNP (last 3 results) No results for input(s): BNP in the last 8760 hours.  ProBNP (last 3 results) No results for input(s): PROBNP in the last 8760 hours.  CBG: No results for input(s): GLUCAP in the last 168 hours.   Signed:  Charlynne Cousins MD.  [facility] 04/21/2015, 8:52 AM

## 2015-04-22 LAB — ANTINUCLEAR ANTIBODIES, IFA: ANA Ab, IFA: NEGATIVE

## 2015-04-23 LAB — CULTURE, BLOOD (ROUTINE X 2)
CULTURE: NO GROWTH
Culture: NO GROWTH

## 2015-05-10 ENCOUNTER — Other Ambulatory Visit: Payer: Self-pay | Admitting: Family Medicine

## 2015-05-10 DIAGNOSIS — I776 Arteritis, unspecified: Secondary | ICD-10-CM

## 2016-01-21 ENCOUNTER — Emergency Department (HOSPITAL_COMMUNITY): Payer: Managed Care, Other (non HMO)

## 2016-01-21 ENCOUNTER — Emergency Department (HOSPITAL_COMMUNITY)
Admission: EM | Admit: 2016-01-21 | Discharge: 2016-01-21 | Disposition: A | Discharge status: Home / Self Care | Payer: Managed Care, Other (non HMO) | Attending: Emergency Medicine | Admitting: Emergency Medicine

## 2016-01-21 ENCOUNTER — Encounter (HOSPITAL_COMMUNITY): Payer: Self-pay | Admitting: Emergency Medicine

## 2016-01-21 DIAGNOSIS — R05 Cough: Secondary | ICD-10-CM | POA: Insufficient documentation

## 2016-01-21 DIAGNOSIS — Z79899 Other long term (current) drug therapy: Secondary | ICD-10-CM | POA: Insufficient documentation

## 2016-01-21 DIAGNOSIS — F17200 Nicotine dependence, unspecified, uncomplicated: Secondary | ICD-10-CM | POA: Insufficient documentation

## 2016-01-21 DIAGNOSIS — Z91010 Allergy to peanuts: Secondary | ICD-10-CM | POA: Insufficient documentation

## 2016-01-21 DIAGNOSIS — J209 Acute bronchitis, unspecified: Secondary | ICD-10-CM

## 2016-01-21 DIAGNOSIS — I10 Essential (primary) hypertension: Secondary | ICD-10-CM | POA: Insufficient documentation

## 2016-01-21 LAB — COMPREHENSIVE METABOLIC PANEL
ALT: 183 U/L — ABNORMAL HIGH (ref 17–63)
AST: 95 U/L — ABNORMAL HIGH (ref 15–41)
Albumin: 4.1 g/dL (ref 3.5–5.0)
Alkaline Phosphatase: 151 U/L — ABNORMAL HIGH (ref 38–126)
Anion gap: 8 (ref 5–15)
BUN: 9 mg/dL (ref 6–20)
CO2: 22 mmol/L (ref 22–32)
Calcium: 9 mg/dL (ref 8.9–10.3)
Chloride: 103 mmol/L (ref 101–111)
Creatinine, Ser: 0.57 mg/dL — ABNORMAL LOW (ref 0.61–1.24)
GFR calc Af Amer: >60 mL/min (ref >60)
GFR calc non Af Amer: >60 mL/min (ref >60)
Glucose, Bld: 120 mg/dL — ABNORMAL HIGH (ref 65–99)
Potassium: 3.8 mmol/L (ref 3.5–5.1)
Sodium: 133 mmol/L — ABNORMAL LOW (ref 135–145)
Total Bilirubin: 0.8 mg/dL (ref 0.3–1.2)
Total Protein: 7.5 g/dL (ref 6.5–8.1)

## 2016-01-21 LAB — CBC WITH DIFFERENTIAL/PLATELET
Basophils Absolute: 0 10*3/uL (ref 0.0–0.1)
Basophils Relative: 0 %
Eosinophils Absolute: 0 10*3/uL (ref 0.0–0.7)
Eosinophils Relative: 0 %
HCT: 37.4 % — ABNORMAL LOW (ref 39.0–52.0)
Hemoglobin: 12.3 g/dL — ABNORMAL LOW (ref 13.0–17.0)
Lymphocytes Relative: 12 %
Lymphs Abs: 3.2 10*3/uL (ref 0.7–4.0)
MCH: 29.6 pg (ref 26.0–34.0)
MCHC: 32.9 g/dL (ref 30.0–36.0)
MCV: 90.1 fL (ref 78.0–100.0)
Monocytes Absolute: 1.9 10*3/uL — ABNORMAL HIGH (ref 0.1–1.0)
Monocytes Relative: 7 %
Neutro Abs: 21.4 10*3/uL — ABNORMAL HIGH (ref 1.7–7.7)
Neutrophils Relative %: 81 %
Platelets: 323 10*3/uL (ref 150–400)
RBC: 4.15 MIL/uL — ABNORMAL LOW (ref 4.22–5.81)
RDW: 13.5 % (ref 11.5–15.5)
WBC: 26.5 10*3/uL — ABNORMAL HIGH (ref 4.0–10.5)

## 2016-01-21 LAB — I-STAT TROPONIN, ED: Troponin i, poc: 0.01 ng/mL (ref 0.00–0.08)

## 2016-01-21 LAB — SEDIMENTATION RATE: Sed Rate: 40 mm/hr — ABNORMAL HIGH (ref 0–16)

## 2016-01-21 LAB — D-DIMER, QUANTITATIVE (NOT AT ARMC): D-Dimer, Quant: 0.35 ug/mL-FEU (ref 0.00–0.50)

## 2016-01-21 LAB — C-REACTIVE PROTEIN: CRP: 18.8 mg/dL — AB (ref <1.0)

## 2016-01-21 MED ORDER — ALBUTEROL SULFATE HFA 108 (90 BASE) MCG/ACT IN AERS: 2.0000 | INHALATION_SPRAY | RESPIRATORY_TRACT | 0 refills | Status: DC | PRN

## 2016-01-21 MED ORDER — ACETAMINOPHEN 325 MG PO TABS
650.0000 mg | ORAL_TABLET | Freq: Four times a day (QID) | ORAL | Status: DC | PRN
  Administered 2016-01-21: 650 mg via ORAL
  Filled 2016-01-21: qty 2

## 2016-01-21 MED ORDER — ALBUTEROL SULFATE HFA 108 (90 BASE) MCG/ACT IN AERS
2.0000 | INHALATION_SPRAY | Freq: Once | RESPIRATORY_TRACT | Status: AC
  Administered 2016-01-21: 2 via RESPIRATORY_TRACT
  Filled 2016-01-21: qty 6.7

## 2016-01-21 MED ORDER — AZITHROMYCIN 250 MG PO TABS: 250.0000 mg | ORAL_TABLET | Freq: Every day | ORAL | 0 refills | Status: DC

## 2016-01-21 MED ORDER — AEROCHAMBER PLUS FLO-VU MEDIUM MISC
1.0000 | Freq: Once | Status: AC
  Administered 2016-01-21: 1
  Filled 2016-01-21: qty 1

## 2016-01-21 MED ORDER — PREDNISONE 10 MG PO TABS: ORAL_TABLET | ORAL | 0 refills | Status: DC

## 2016-01-21 NOTE — Discharge Instructions (Signed)
Schedule to see Dr. Julien Nordmann for recheck.   See Dr. Drema Dallas next week for reevaluation.  Return if any problems.

## 2016-01-21 NOTE — ED Triage Notes (Signed)
CXR not completed at this time-PA at bedside

## 2016-01-21 NOTE — ED Triage Notes (Signed)
Pt reports increased cough, nasal drainage and intermittent fever x 4 days. Cough producing think yellow mucus. Tx with OTC meds. Recent hx of "stomach flu last week". C/o intermittent chest aching since yesterday. Lungs clear, slightly diminished in bases.

## 2016-01-21 NOTE — ED Notes (Signed)
Patient d/c'd self care.  F/U and medications reviewed.  Patient verbalized understanding. 

## 2016-01-21 NOTE — ED Provider Notes (Signed)
MSE was initiated and I personally evaluated the patient and placed orders (if any) at  11:10 AM on January 21, 2016.  Patient presents with a one-week history of cough, nasal congestion, shortness of breath that is worse on exertion, and chest pain patient describes chest pain as an achy pain that is worse with coughing. Patient reports he's had associated subjective fever. Patient is taken DayQuil, Mucinex, and other over-the-counter medicines without relief.  Heart sounds normal, mild tachycardia. Rhonchi bilaterally. No abdominal tenderness, abdomen soft.  The patient appears stable so that the remainder of the MSE may be completed by another provider.   Frederica Kuster, PA-C 01/21/16 [place], MD 01/21/16 [phone]

## 2016-01-21 NOTE — ED Notes (Signed)
Informed PA of fever prior to D/C.

## 2016-01-21 NOTE — ED Provider Notes (Signed)
[facility] DEPT Provider Note   CSN: [id number] Arrival date & time: 01/21/16  1002     History   Chief Complaint Chief Complaint  Patient presents with  . Cough  . Nasal Congestion    HPI Henry Schmitt is a 56 y.o. male.  The history is provided by the patient. No language interpreter was used.  Cough  This is a new problem. The problem occurs constantly. The problem has not changed since onset.The cough is productive of sputum. There has been no fever. Pertinent negatives include no rhinorrhea. He has tried nothing for the symptoms. The treatment provided no relief. He is not a smoker.  Pt complains of a cough and congestion.  Pt reports he feels bad.    Past Medical History:  Diagnosis Date  . Hypertension     Patient Active Problem List   Diagnosis Date Noted  . 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 Abdominal pain 04/18/2015  . Essential hypertension, benign 04/18/2015  . Leukocytosis 05/07/2014  . Hypogonadotropic hypogonadism ([place]) 04/06/2014  . Gynecomastia, male 03/18/2014    Past Surgical History:  Procedure Laterality Date  . CHOLECYSTECTOMY         Home Medications    Prior to Admission medications   Medication Sig Start Date End Date Taking? Authorizing Provider  dextromethorphan (DELSYM) 30 MG/5ML liquid Take 30 mg by mouth every 6 (six) hours as needed for cough.   Yes Historical Provider, MD  DM-Doxylamine-Acetaminophen (CORICIDIN HBP NIGHTTIME COLD) 15-6.25-325 MG/15ML LIQD Take 15 mLs by mouth at bedtime as needed (cold/cough).   Yes Historical Provider, MD  Pseudoeph-Doxylamine-DM-APAP (DAYQUIL/NYQUIL COLD/FLU RELIEF PO) Take 2 capsules by mouth every 6 (six) hours as needed (cold/cough).   Yes Historical Provider, MD  testosterone (ANDROGEL) 50 MG/5GM (1%) GEL Place 5 g onto the skin daily. Patient not taking: Reported on 01/21/2016 04/15/14   Henry Kingdom, MD    Family History Family History  Problem Relation Age of Onset  . Heart failure  Mother   . Hypertension Mother   . Diabetes Other     Social History Social History  Substance Use Topics  . Smoking status: Current Every Day Smoker    Packs/day: 0.50  . Smokeless tobacco: Never Used  . Alcohol use No     Allergies   Antihistamines, chlorpheniramine-type; Lisinopril; and Peanut-containing drug products   Review of Systems Review of Systems  HENT: Negative for rhinorrhea.   Respiratory: Positive for cough.   All other systems reviewed and are negative.    Physical Exam Updated Vital Signs BP 151/76 (BP Location: Left Arm)   Pulse 96   Temp 99.1 F (37.3 C) (Oral)   Resp 18   Wt 81.6 kg   SpO2 95%   BMI 28.62 kg/m   Physical Exam  Constitutional: He is oriented to person, place, and time. He appears well-developed and well-nourished.  HENT:  Head: Normocephalic and atraumatic.  Eyes: EOM are normal. Pupils are equal, round, and reactive to light.  Neck: Normal range of motion.  Cardiovascular: Normal rate.   Pulmonary/Chest: Effort normal.  Abdominal: Soft.  Musculoskeletal: Normal range of motion.  Neurological: He is alert and oriented to person, place, and time.  Skin: Skin is warm.  Psychiatric: He has a normal mood and affect.  Nursing note and vitals reviewed. Pt complains of coughing and congestion.  Pt reports slight shortness of breath.  Pt has a history of Leukocytosis.  He has been seen by Dr. Julien Nordmann.  He did  not follow up because he was afraid of what he might have.     ED Treatments / Results  Labs (all labs ordered are listed, but only abnormal results are displayed) Labs Reviewed  CBC WITH DIFFERENTIAL/PLATELET - Abnormal; Notable for the following:       Result Value   WBC 26.5 (*)    RBC 4.15 (*)    Hemoglobin 12.3 (*)    HCT 37.4 (*)    Neutro Abs 21.4 (*)    Monocytes Absolute 1.9 (*)    All other components within normal limits  COMPREHENSIVE METABOLIC PANEL - Abnormal; Notable for the following:    Sodium 133  (*)    Glucose, Bld 120 (*)    Creatinine, Ser 0.57 (*)    AST 95 (*)    ALT 183 (*)    Alkaline Phosphatase 151 (*)    All other components within normal limits  SEDIMENTATION RATE - Abnormal; Notable for the following:    Sed Rate 40 (*)    All other components within normal limits  D-DIMER, QUANTITATIVE (NOT AT [facility])  C-REACTIVE PROTEIN  I-STAT TROPOININ, ED    EKG  EKG Interpretation  Date/Time:  Friday January 21 2016 10:36:09 EST Ventricular Rate:  105 PR Interval:    QRS Duration: 100 QT Interval:  352 QTC Calculation: 466 R Axis:   85 Text Interpretation:  Sinus tachycardia Probable left atrial enlargement RSR' in V1 or V2, right VCD or RVH Since last tracing rate faster Otherwise no significant change Confirmed by KNOTT MD, Quillian Quince [phone]) on 01/21/2016 3:32:49 PM       Radiology Dg Chest 2 View  Result Date: 01/21/2016 CLINICAL DATA:  Cough, congestion, shortness of breath EXAM: CHEST  2 VIEW COMPARISON:  06/08/2008 FINDINGS: The heart size and mediastinal contours are within normal limits. Both lungs are clear. The visualized skeletal structures are unremarkable. IMPRESSION: No active cardiopulmonary disease. Electronically Signed   By: Kathreen Devoid   On: 01/21/2016 11:01    Procedures Procedures (including critical care time)  Medications Ordered in ED Medications  albuterol (PROVENTIL HFA;VENTOLIN HFA) 108 (90 Base) MCG/ACT inhaler 2 puff (not administered)  AEROCHAMBER PLUS FLO-VU MEDIUM MISC 1 each (not administered)     Initial Impression / Assessment and Plan / ED Course  I have reviewed the triage vital signs and the nursing notes.  Pertinent labs & imaging results that were available during my care of the patient were reviewed by me and considered in my medical decision making (see chart for details).  Clinical Course as of Jan 21 1819  Fri Jan 21, 2016  1457 ED EKG within 10 minutes [LS]    Clinical Course User Index [LS] Fransico Meadow, PA-C      Dr. Laneta Simmers in to see and examine pt.  He advised zithromax, albuterol and prednisone.  Pt advised he needs to follow up due to leukocytosis  Final Clinical Impressions(s) / ED Diagnoses   Final diagnoses:  Acute bronchitis, unspecified organism    New Prescriptions New Prescriptions   ALBUTEROL (PROVENTIL HFA;VENTOLIN HFA) 108 (90 BASE) MCG/ACT INHALER    Inhale 2 puffs into the lungs every 4 (four) hours as needed for wheezing or shortness of breath.   AZITHROMYCIN (ZITHROMAX) 250 MG TABLET    Take 1 tablet (250 mg total) by mouth daily. Take first 2 tablets together, then 1 every day until finished.   PREDNISONE (DELTASONE) 10 MG TABLET    6,5,4,3,2,1 taper  An  After Visit Summary was printed and given to the patient.    [place], PA-C 01/21/16 [place], MD 01/31/16 [phone]

## 2016-01-24 ENCOUNTER — Encounter (HOSPITAL_COMMUNITY): Payer: Self-pay | Admitting: Emergency Medicine

## 2016-02-03 ENCOUNTER — Inpatient Hospital Stay (HOSPITAL_COMMUNITY)
Admission: EM | Admit: 2016-02-03 | Discharge: 2016-02-05 | DRG: 194 | Disposition: A | Discharge status: Home / Self Care | Payer: Managed Care, Other (non HMO) | Attending: Internal Medicine | Admitting: Internal Medicine

## 2016-02-03 ENCOUNTER — Encounter (HOSPITAL_COMMUNITY): Payer: Self-pay | Admitting: Emergency Medicine

## 2016-02-03 ENCOUNTER — Emergency Department (HOSPITAL_COMMUNITY): Payer: Managed Care, Other (non HMO)

## 2016-02-03 DIAGNOSIS — Z9049 Acquired absence of other specified parts of digestive tract: Secondary | ICD-10-CM

## 2016-02-03 DIAGNOSIS — D649 Anemia, unspecified: Secondary | ICD-10-CM | POA: Diagnosis present

## 2016-02-03 DIAGNOSIS — E230 Hypopituitarism: Secondary | ICD-10-CM | POA: Diagnosis present

## 2016-02-03 DIAGNOSIS — F17210 Nicotine dependence, cigarettes, uncomplicated: Secondary | ICD-10-CM | POA: Diagnosis present

## 2016-02-03 DIAGNOSIS — Z833 Family history of diabetes mellitus: Secondary | ICD-10-CM

## 2016-02-03 DIAGNOSIS — J40 Bronchitis, not specified as acute or chronic: Secondary | ICD-10-CM | POA: Diagnosis present

## 2016-02-03 DIAGNOSIS — J111 Influenza due to unidentified influenza virus with other respiratory manifestations: Secondary | ICD-10-CM

## 2016-02-03 DIAGNOSIS — Z8249 Family history of ischemic heart disease and other diseases of the circulatory system: Secondary | ICD-10-CM

## 2016-02-03 DIAGNOSIS — J209 Acute bronchitis, unspecified: Secondary | ICD-10-CM | POA: Diagnosis present

## 2016-02-03 DIAGNOSIS — E860 Dehydration: Secondary | ICD-10-CM | POA: Diagnosis present

## 2016-02-03 DIAGNOSIS — J101 Influenza due to other identified influenza virus with other respiratory manifestations: Secondary | ICD-10-CM | POA: Diagnosis present

## 2016-02-03 DIAGNOSIS — E876 Hypokalemia: Secondary | ICD-10-CM | POA: Diagnosis present

## 2016-02-03 DIAGNOSIS — I10 Essential (primary) hypertension: Secondary | ICD-10-CM | POA: Diagnosis present

## 2016-02-03 LAB — COMPREHENSIVE METABOLIC PANEL
ALK PHOS: 136 U/L — AB (ref 38–126)
ALT: 76 U/L — AB (ref 17–63)
ANION GAP: 10 (ref 5–15)
AST: 55 U/L — ABNORMAL HIGH (ref 15–41)
Albumin: 4 g/dL (ref 3.5–5.0)
BUN: 19 mg/dL (ref 6–20)
CALCIUM: 8.8 mg/dL — AB (ref 8.9–10.3)
CO2: 25 mmol/L (ref 22–32)
CREATININE: 0.81 mg/dL (ref 0.61–1.24)
Chloride: 102 mmol/L (ref 101–111)
Glucose, Bld: 113 mg/dL — ABNORMAL HIGH (ref 65–99)
Potassium: 3.1 mmol/L — ABNORMAL LOW (ref 3.5–5.1)
Sodium: 137 mmol/L (ref 135–145)
Total Bilirubin: 0.6 mg/dL (ref 0.3–1.2)
Total Protein: 8.3 g/dL — ABNORMAL HIGH (ref 6.5–8.1)

## 2016-02-03 LAB — INFLUENZA PANEL BY PCR (TYPE A & B)
INFLAPCR: POSITIVE — AB
Influenza B By PCR: NEGATIVE

## 2016-02-03 LAB — URINALYSIS, ROUTINE W REFLEX MICROSCOPIC
Bilirubin Urine: NEGATIVE
Glucose, UA: NEGATIVE mg/dL
Hgb urine dipstick: NEGATIVE
Ketones, ur: 20 mg/dL — AB
LEUKOCYTES UA: NEGATIVE
NITRITE: NEGATIVE
PROTEIN: NEGATIVE mg/dL
Specific Gravity, Urine: >1.046 — ABNORMAL HIGH (ref 1.005–1.030)
pH: 5 (ref 5.0–8.0)

## 2016-02-03 LAB — LIPASE, BLOOD: LIPASE: 22 U/L (ref 11–51)

## 2016-02-03 LAB — CBC
HCT: 38.7 % — ABNORMAL LOW (ref 39.0–52.0)
HEMOGLOBIN: 12.5 g/dL — AB (ref 13.0–17.0)
MCH: 28.7 pg (ref 26.0–34.0)
MCHC: 32.3 g/dL (ref 30.0–36.0)
MCV: 89 fL (ref 78.0–100.0)
PLATELETS: 389 10*3/uL (ref 150–400)
RBC: 4.35 MIL/uL (ref 4.22–5.81)
RDW: 13.7 % (ref 11.5–15.5)
WBC: 8.9 10*3/uL (ref 4.0–10.5)

## 2016-02-03 LAB — MAGNESIUM: MAGNESIUM: 2.4 mg/dL (ref 1.7–2.4)

## 2016-02-03 LAB — I-STAT CG4 LACTIC ACID, ED
LACTIC ACID, VENOUS: 1.8 mmol/L (ref 0.5–1.9)
LACTIC ACID, VENOUS: 1.43 mmol/L (ref 0.5–1.9)

## 2016-02-03 LAB — I-STAT TROPONIN, ED: TROPONIN I, POC: 0.02 ng/mL (ref 0.00–0.08)

## 2016-02-03 MED ORDER — MORPHINE SULFATE (PF) 4 MG/ML IV SOLN
4.0000 mg | Freq: Once | INTRAVENOUS | Status: AC
Start: 2016-02-03 — End: 2016-02-03
  Administered 2016-02-03: 4 mg via INTRAVENOUS
  Filled 2016-02-03: qty 1

## 2016-02-03 MED ORDER — SODIUM CHLORIDE 0.9 % IV SOLN: Freq: Once | INTRAVENOUS | Status: DC

## 2016-02-03 MED ORDER — IPRATROPIUM-ALBUTEROL 0.5-2.5 (3) MG/3ML IN SOLN
3.0000 mL | Freq: Once | RESPIRATORY_TRACT | Status: AC
  Administered 2016-02-03: 3 mL via RESPIRATORY_TRACT
  Filled 2016-02-03: qty 3

## 2016-02-03 MED ORDER — SODIUM CHLORIDE 0.9 % IV SOLN
25.0000 mg | Freq: Once | INTRAVENOUS | Status: AC
  Administered 2016-02-03: 25 mg via INTRAVENOUS
  Filled 2016-02-03: qty 1

## 2016-02-03 MED ORDER — IOPAMIDOL (ISOVUE-370) INJECTION 76%
INTRAVENOUS | Status: AC
  Filled 2016-02-03: qty 100

## 2016-02-03 MED ORDER — OSELTAMIVIR PHOSPHATE 75 MG PO CAPS
75.0000 mg | ORAL_CAPSULE | Freq: Two times a day (BID) | ORAL | Status: DC
  Administered 2016-02-04 – 2016-02-05 (×4): 75 mg via ORAL
  Filled 2016-02-03 (×4): qty 1

## 2016-02-03 MED ORDER — MORPHINE SULFATE (PF) 4 MG/ML IV SOLN
4.0000 mg | Freq: Once | INTRAVENOUS | Status: AC
  Administered 2016-02-03: 4 mg via INTRAVENOUS
  Filled 2016-02-03: qty 1

## 2016-02-03 MED ORDER — IOPAMIDOL (ISOVUE-370) INJECTION 76%
100.0000 mL | Freq: Once | INTRAVENOUS | Status: AC | PRN
  Administered 2016-02-03: 100 mL via INTRAVENOUS

## 2016-02-03 MED ORDER — KETOROLAC TROMETHAMINE 30 MG/ML IJ SOLN
30.0000 mg | Freq: Once | INTRAMUSCULAR | Status: AC
  Administered 2016-02-03: 30 mg via INTRAVENOUS
  Filled 2016-02-03: qty 1

## 2016-02-03 MED ORDER — SODIUM CHLORIDE 0.9 % IV BOLUS (SEPSIS)
1000.0000 mL | Freq: Once | INTRAVENOUS | Status: AC
  Administered 2016-02-03: 1000 mL via INTRAVENOUS

## 2016-02-03 MED ORDER — POTASSIUM CHLORIDE IN NACL 40-0.9 MEQ/L-% IV SOLN
INTRAVENOUS | Status: DC
  Administered 2016-02-03 – 2016-02-04 (×4): 125 mL/h via INTRAVENOUS
  Filled 2016-02-03 (×5): qty 1000

## 2016-02-03 MED ORDER — LEVOFLOXACIN IN D5W 750 MG/150ML IV SOLN
750.0000 mg | Freq: Once | INTRAVENOUS | Status: AC
  Administered 2016-02-03: 750 mg via INTRAVENOUS
  Filled 2016-02-03: qty 150

## 2016-02-03 MED ORDER — ONDANSETRON HCL 4 MG/2ML IJ SOLN
4.0000 mg | Freq: Once | INTRAMUSCULAR | Status: AC
  Administered 2016-02-03: 4 mg via INTRAVENOUS
  Filled 2016-02-03: qty 2

## 2016-02-03 NOTE — ED Provider Notes (Signed)
[facility] DEPT Provider Note   CSN: [id number] Arrival date & time: 02/03/16  1332     History   Chief Complaint Chief Complaint  Patient presents with  . Emesis  . Shortness of Breath    HPI Henry Schmitt is a 56 y.o. male.  HPI   56 yo M with PMHx of HTN, hypogonadotropic hypogonadism who presents with cough, fever, general body aches. Pt was just seen on 1/12, diagnosed with viral bronchitis and d/c home on azithromycin and steroids. Since then, pt states his cough, SOB have not improved as well as his aching, throbbing CP. Since Monday, he has also developed nausea, emesis, and cramp like abdominal pain. He has been unable to tolerate PO for past 48 hours. He has associated chills, fatigue, and subjective fevers. No dysuria. No hematuria.   Past Medical History:  Diagnosis Date  . Hypertension     Patient Active Problem List   Diagnosis Date Noted  . 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 Abdominal pain 04/18/2015  . Essential hypertension, benign 04/18/2015  . Leukocytosis 05/07/2014  . Hypogonadotropic hypogonadism ([place]) 04/06/2014  . Gynecomastia, male 03/18/2014    Past Surgical History:  Procedure Laterality Date  . CHOLECYSTECTOMY         Home Medications    Prior to Admission medications   Medication Sig Start Date End Date Taking? Authorizing Provider  albuterol (PROVENTIL HFA;VENTOLIN HFA) 108 (90 Base) MCG/ACT inhaler Inhale 2 puffs into the lungs every 4 (four) hours as needed for wheezing or shortness of breath. 01/21/16   Fransico Meadow, PA-C  azithromycin (ZITHROMAX) 250 MG tablet Take 1 tablet (250 mg total) by mouth daily. Take first 2 tablets together, then 1 every day until finished. 01/21/16   Fransico Meadow, PA-C  dextromethorphan (DELSYM) 30 MG/5ML liquid Take 30 mg by mouth every 6 (six) hours as needed for cough.    Historical Provider, MD  DM-Doxylamine-Acetaminophen (CORICIDIN HBP NIGHTTIME COLD) 15-6.25-325 MG/15ML LIQD Take 15 mLs by mouth  at bedtime as needed (cold/cough).    Historical Provider, MD  predniSONE (DELTASONE) 10 MG tablet 6,5,4,3,2,1 taper 01/21/16   Fransico Meadow, PA-C  Pseudoeph-Doxylamine-DM-APAP (DAYQUIL/NYQUIL COLD/FLU RELIEF PO) Take 2 capsules by mouth every 6 (six) hours as needed (cold/cough).    Historical Provider, MD  testosterone (ANDROGEL) 50 MG/5GM (1%) GEL Place 5 g onto the skin daily. Patient not taking: Reported on 01/21/2016 04/15/14   Philemon Kingdom, MD    Family History Family History  Problem Relation Age of Onset  . Heart failure Mother   . Hypertension Mother   . Diabetes Other     Social History Social History  Substance Use Topics  . Smoking status: Current Every Day Smoker    Packs/day: 0.50  . Smokeless tobacco: Never Used  . Alcohol use No     Allergies   Antihistamines, chlorpheniramine-type; Lisinopril; and Peanut-containing drug products   Review of Systems Review of Systems  Constitutional: Positive for fatigue. Negative for chills and fever.  HENT: Positive for congestion and rhinorrhea.   Eyes: Negative for visual disturbance.  Respiratory: Positive for cough and shortness of breath. Negative for wheezing.   Cardiovascular: Negative for chest pain and leg swelling.  Gastrointestinal: Positive for abdominal pain, diarrhea, nausea and vomiting.  Genitourinary: Negative for dysuria and flank pain.  Musculoskeletal: Negative for neck pain and neck stiffness.  Skin: Negative for rash and wound.  Allergic/Immunologic: Negative for immunocompromised state.  Neurological: Negative for syncope, weakness and headaches.  All other systems reviewed and are negative.    Physical Exam Updated Vital Signs BP 137/68   Pulse 88   Temp 98.3 F (36.8 C) (Oral)   Resp 16   SpO2 100%   Physical Exam  Constitutional: He is oriented to person, place, and time. He appears well-developed and well-nourished. No distress.  HENT:  Head: Normocephalic and atraumatic.    Markedly dry, tacky MM  Eyes: Conjunctivae are normal.  Neck: Neck supple.  Cardiovascular: Normal rate, regular rhythm and normal heart sounds.  Exam reveals no friction rub.   No murmur heard. Pulmonary/Chest: Effort normal. No respiratory distress. He has wheezes (diffuse, expiratory). He has no rales.  Abdominal: Soft. He exhibits no distension. There is tenderness (diffuse). There is no rebound and no guarding.  Musculoskeletal: He exhibits no edema.  Neurological: He is alert and oriented to person, place, and time. He exhibits normal muscle tone.  Skin: Skin is warm. Capillary refill takes less than 2 seconds.  Psychiatric: He has a normal mood and affect.  Nursing note and vitals reviewed.    ED Treatments / Results  Labs (all labs ordered are listed, but only abnormal results are displayed) Labs Reviewed  COMPREHENSIVE METABOLIC PANEL - Abnormal; Notable for the following:       Result Value   Potassium 3.1 (*)    Glucose, Bld 113 (*)    Calcium 8.8 (*)    Total Protein 8.3 (*)    AST 55 (*)    ALT 76 (*)    Alkaline Phosphatase 136 (*)    All other components within normal limits  CBC - Abnormal; Notable for the following:    Hemoglobin 12.5 (*)    HCT 38.7 (*)    All other components within normal limits  LIPASE, BLOOD  URINALYSIS, ROUTINE W REFLEX MICROSCOPIC  I-STAT TROPOININ, ED    EKG  EKG Interpretation  Date/Time:  Thursday February 03 2016 18:13:33 EST Ventricular Rate:  88 PR Interval:    QRS Duration: 97 QT Interval:  388 QTC Calculation: 470 R Axis:   82 Text Interpretation:  Sinus rhythm RSR' in V1 or V2, right VCD or RVH Nonspecific T abnrm, anterolateral leads Since last EKG No significant change since last tracing Confirmed by Abbegayle Denault MD, Kipton Skillen [phone]) on 02/03/2016 6:19:19 PM       Radiology Dg Chest 2 View  Result Date: 02/03/2016 CLINICAL DATA:  Emesis.  Diarrhea. EXAM: CHEST  2 VIEW COMPARISON:  01/21/2016. FINDINGS: Mediastinum  and hilar structures normal. Low lung volumes with mild basilar atelectasis. Heart size normal. No pleural effusion or pneumothorax. Surgical clips right upper quadrant. IMPRESSION: No acute cardiopulmonary disease. Electronically Signed   By: Marcello Moores  Register   On: 02/03/2016 14:22    Procedures Procedures (including critical care time)  Medications Ordered in ED Medications - No data to display   Initial Impression / Assessment and Plan / ED Course  I have reviewed the triage vital signs and the nursing notes.  Pertinent labs & imaging results that were available during my care of the patient were reviewed by me and considered in my medical decision making (see chart for details).     56 yo M with PMhx as above here with persistent fevers, cough, now n/v/d and inability to tolerate PO. On arrival, pt borderline tachycardic, markedly dehydrated clinically. Lungs with diffuse wheezes. CXR shows no abnormality and initial labs c/w dehydration with mild hypokalemia, transaminitis. CT subsequently obtained given duration and severity  of sx, which shows diffuse bronchitis, possible PNA, and enteritis. Influenza is positive for flu A. Suspect majority fo this is 2/2 influenza, though superimposed PNA cannot be ruled out. Pt did complete course of azithro but this is not adequate single agent coverage for CAP. Will add on Levaquin, continue fluids. He remains unable to tolerate PO. Will admit.  Final Clinical Impressions(s) / ED Diagnoses   Final diagnoses:  Dehydration  Influenza     Duffy Bruce, MD 02/04/16 [phone]

## 2016-02-03 NOTE — H&P (Signed)
History and Physical    Henry Schmitt [id number] DOB: August 22, 1960 DOA: 02/03/2016  PCP: Gerrit Heck, MD   Patient coming from: Home.  Chief Complaint: Nausea and vomiting.  HPI: Henry Schmitt is a 56 y.o. male with medical history significant of hypertension, hypogonadotropic hypogonadism who was seen about 2 weeks ago for productive cough, dyspnea and wheezing. He was given prednisone, a Zithromax, cough syrup and albuterol inhaler which have not relieved his symptoms enough. He also states that in the past 4 days he has been having several episodes of emesis and diarrhea daily. He denies fever, sore throat, rhinorrhea, but complains of chills. He denies chest pain, palpitations, dizziness, diaphoresis or pitting edema lower extremities. He denies abdominal pain, melena or hematochezia. He denies dysuria or frequency.  ED Course: The patient in the emergency department received supplemental oxygen, DuoNeb, 2 L of normal saline bolus, Tamiflu, morphine 4 mg IVP, Zofran 4 mg IVP and Levaquin 750 mg IVPB. He is states that he is feeling better. His workup showed WBC 8.9, hemoglobin 12.5 g/dL, platelets 389 his lactic acid was 1.80, sodium 137, potassium 3.1, chloride 102 and bicarbonate 25 mmol/L. BUN was 19, creatinine 0.81, magnesium 2.4 and glucose 13 mg/dL. His lipase level was 22 and nasal his swab was positive for Influenza A.   CT angiogram of the chest showed no pulmonary embolism, but positive findings were bilateral lower lung bronchial wall thickening and patchy groundglass on right upper lobe.  Review of Systems: As per HPI otherwise 10 point review of systems negative.    Past Medical History:  Diagnosis Date  . Hypertension     Past Surgical History:  Procedure Laterality Date  . CHOLECYSTECTOMY       reports that he has been smoking.  He has been smoking about 0.50 packs per day. He has never used smokeless tobacco. He reports that he does not drink alcohol or  use drugs.  Allergies  Allergen Reactions  . Antihistamines, Chlorpheniramine-Type Hives and Swelling  . Lisinopril Swelling  . Peanut-Containing Drug Products Swelling    ALL TREE NUTS    Family History  Problem Relation Age of Onset  . Heart failure Mother   . Hypertension Mother   . Diabetes Other     Prior to Admission medications   Medication Sig Start Date End Date Taking? Authorizing Provider  albuterol (PROVENTIL HFA;VENTOLIN HFA) 108 (90 Base) MCG/ACT inhaler Inhale 2 puffs into the lungs every 4 (four) hours as needed for wheezing or shortness of breath. 01/21/16  Yes Hollace Kinnier Sofia, PA-C  dextromethorphan (DELSYM) 30 MG/5ML liquid Take 30 mg by mouth every 6 (six) hours as needed for cough.   Yes Historical Provider, MD  ibuprofen (ADVIL,MOTRIN) 200 MG tablet Take 600 mg by mouth every 6 (six) hours as needed.   Yes Historical Provider, MD  Pseudoeph-Doxylamine-DM-APAP (DAYQUIL/NYQUIL COLD/FLU RELIEF PO) Take 2 capsules by mouth every 6 (six) hours as needed (cold/cough).   Yes Historical Provider, MD  azithromycin (ZITHROMAX) 250 MG tablet Take 1 tablet (250 mg total) by mouth daily. Take first 2 tablets together, then 1 every day until finished. Patient not taking: Reported on 02/03/2016 01/21/16   Fransico Meadow, PA-C  predniSONE (DELTASONE) 10 MG tablet [phone] taper Patient not taking: Reported on 02/03/2016 01/21/16   Fransico Meadow, PA-C  testosterone (ANDROGEL) 50 MG/5GM (1%) GEL Place 5 g onto the skin daily. Patient not taking: Reported on 01/21/2016 04/15/14   Philemon Kingdom, MD  Physical Exam:  Constitutional: Looks acutely ill. Vitals:   02/03/16 1403 02/03/16 1550 02/03/16 1815 02/03/16 1835  BP: 142/78 137/68 150/78   Pulse: 100 88 102 88  Resp: 20 16 25 18   Temp: 98.3 F (36.8 C)     TempSrc: Oral     SpO2: 100% 100% 97% 92%   Eyes: PERRL, lids and conjunctivae normal ENMT: Mucous membranes are mildly dry. Posterior pharynx clear of any exudate  or lesions. Neck: normal, supple, no masses, no thyromegaly Respiratory: Bilateral rhonchi and wheezing, no crackles. Normal respiratory effort. No accessory muscle use.  Cardiovascular: Regular rate and rhythm, no murmurs / rubs / gallops. No extremity edema. 2+ pedal pulses. No carotid bruits.  Abdomen: Soft, no tenderness, no masses palpated. No hepatosplenomegaly. Bowel sounds positive.  Musculoskeletal: no clubbing / cyanosis. Good ROM, no contractures. Normal muscle tone.  Skin: no rashes, lesions, ulcers. Neurologic: CN 2-12 grossly intact. Sensation intact, DTR normal. Strength 5/5 in all 4.  Psychiatric: Normal judgment and insight. Alert and oriented x 3. Normal mood.    Labs on Admission: I have personally reviewed following labs and imaging studies  CBC:  Recent Labs Lab 02/03/16 1436  WBC 8.9  HGB 12.5*  HCT 38.7*  MCV 89.0  PLT [id number]   Basic Metabolic Panel:  Recent Labs Lab 02/03/16 1436  NA 137  K 3.1*  CL 102  CO2 25  GLUCOSE 113*  BUN 19  CREATININE 0.81  CALCIUM 8.8*  MG 2.4   GFR: CrCl cannot be calculated (Unknown ideal weight.). Liver Function Tests:  Recent Labs Lab 02/03/16 1436  AST 55*  ALT 76*  ALKPHOS 136*  BILITOT 0.6  PROT 8.3*  ALBUMIN 4.0    Recent Labs Lab 02/03/16 1436  LIPASE 22   No results for input(s): AMMONIA in the last 168 hours. Coagulation Profile: No results for input(s): INR, PROTIME in the last 168 hours. Cardiac Enzymes: No results for input(s): CKTOTAL, CKMB, CKMBINDEX, TROPONINI in the last 168 hours. BNP (last 3 results) No results for input(s): PROBNP in the last 8760 hours. HbA1C: No results for input(s): HGBA1C in the last 72 hours. CBG: No results for input(s): GLUCAP in the last 168 hours. Lipid Profile: No results for input(s): CHOL, HDL, LDLCALC, TRIG, CHOLHDL, LDLDIRECT in the last 72 hours. Thyroid Function Tests: No results for input(s): TSH, T4TOTAL, FREET4, T3FREE, THYROIDAB in the  last 72 hours. Anemia Panel: No results for input(s): VITAMINB12, FOLATE, FERRITIN, TIBC, IRON, RETICCTPCT in the last 72 hours. Urine analysis:    Component Value Date/Time   COLORURINE AMBER (A) 04/18/2015 0132   APPEARANCEUR CLEAR 04/18/2015 0132   LABSPEC 1.040 (H) 04/18/2015 0132   PHURINE 6.5 04/18/2015 0132   GLUCOSEU NEGATIVE 04/18/2015 0132   HGBUR NEGATIVE 04/18/2015 0132   BILIRUBINUR SMALL (A) 04/18/2015 0132   KETONESUR NEGATIVE 04/18/2015 0132   PROTEINUR 30 (A) 04/18/2015 0132   UROBILINOGEN 0.2 02/10/2008 0229   NITRITE NEGATIVE 04/18/2015 0132   LEUKOCYTESUR NEGATIVE 04/18/2015 0132    Radiological Exams on Admission: Dg Chest 2 View  Result Date: 02/03/2016 CLINICAL DATA:  Emesis.  Diarrhea. EXAM: CHEST  2 VIEW COMPARISON:  01/21/2016. FINDINGS: Mediastinum and hilar structures normal. Low lung volumes with mild basilar atelectasis. Heart size normal. No pleural effusion or pneumothorax. Surgical clips right upper quadrant. IMPRESSION: No acute cardiopulmonary disease. Electronically Signed   By: [place]   On: 02/03/2016 14:22   Ct Angio Chest Pe W And/or Wo Contrast  Result Date: 02/03/2016 CLINICAL DATA:  Patient with emesis and diarrhea. Productive cough. Shortness of breath. EXAM: CT ANGIOGRAPHY CHEST CT ABDOMEN AND PELVIS WITH CONTRAST TECHNIQUE: Multidetector CT imaging of the chest was performed using the standard protocol during bolus administration of intravenous contrast. Multiplanar CT image reconstructions and MIPs were obtained to evaluate the vascular anatomy. Multidetector CT imaging of the abdomen and pelvis was performed using the standard protocol during bolus administration of intravenous contrast. CONTRAST:  100 cc Isovue 370 COMPARISON:  CT abdomen pelvis 04/18/2015. FINDINGS: CTA CHEST FINDINGS Cardiovascular: Normal heart size. No pericardial effusion. Aorta and main pulmonary artery normal in caliber. Motion artifact limits evaluation.  Within the above limitation no evidence for pulmonary embolus. Mediastinum/Nodes: No axillary adenopathy. Enlarged mediastinal and bilateral hilar lymph nodes including a right peritracheal lymph node measuring 1.3 cm (image 29; series 4), a 1.4 cm subcarinal lymph node (image 45; series 4), a 0.9 cm left hilar lymph node (image 43; series 4) and a 1.0 cm right hilar lymph node (image 48; series 4). The esophagus is normal in appearance. Lungs/Pleura: Central airways are patent. Bilateral predominately lower lung bronchial wall thickening is demonstrated. Patchy ground-glass opacities are demonstrated within the right upper lobe (image 23; series 9). No pleural effusion or pneumothorax. Musculoskeletal: No aggressive or acute appearing osseous lesions. Review of the MIP images confirms the above findings. CT ABDOMEN and PELVIS FINDINGS Hepatobiliary: Liver is normal in size and contour. No focal hepatic lesion is identified. Patient status post cholecystectomy. Pancreas: Unremarkable Spleen: Unremarkable Adrenals/Urinary Tract: The adrenal glands are normal. Kidneys enhance symmetrically with contrast. Unchanged cyst within the interpolar region of the left kidney. No hydronephrosis. Urinary bladder is unremarkable. Stomach/Bowel: Descending and sigmoid colonic diverticulosis. No CT evidence for acute diverticulitis. Fluid throughout the distal small bowel and colon. No evidence for bowel obstruction. Vascular/Lymphatic: Normal caliber abdominal aorta. Peripheral calcified atherosclerotic plaque. No retroperitoneal lymphadenopathy. Reproductive: Prostate is unremarkable. Other: None. Musculoskeletal: No aggressive or acute appearing osseous lesions. Lumbar spine degenerative changes. Review of the MIP images confirms the above findings. IMPRESSION: Motion artifact limits evaluation however no definite evidence for acute pulmonary embolus. Bilateral predominately lower lung bronchial wall thickening. Additionally  there is patchy ground-glass consolidation within the right upper lobe. Findings are nonspecific however may be secondary to an infectious/ inflammatory process. Bronchitis is a consideration. Additionally, there are multiple prominent and enlarged mediastinal and hilar lymph nodes which may be reactive in etiology. Recommend follow-up chest CT in 6-8 weeks to assess for interval improvement/ resolution of pulmonary findings. No acute process within the abdomen or pelvis. Fluid throughout the distal small bowel and colon compatible with clinical history of diarrhea. Electronically Signed   By: Lovey Newcomer M.D.   On: 02/03/2016 19:28   Ct Abdomen Pelvis W Contrast  Result Date: 02/03/2016 CLINICAL DATA:  Patient with emesis and diarrhea. Productive cough. Shortness of breath. EXAM: CT ANGIOGRAPHY CHEST CT ABDOMEN AND PELVIS WITH CONTRAST TECHNIQUE: Multidetector CT imaging of the chest was performed using the standard protocol during bolus administration of intravenous contrast. Multiplanar CT image reconstructions and MIPs were obtained to evaluate the vascular anatomy. Multidetector CT imaging of the abdomen and pelvis was performed using the standard protocol during bolus administration of intravenous contrast. CONTRAST:  100 cc Isovue 370 COMPARISON:  CT abdomen pelvis 04/18/2015. FINDINGS: CTA CHEST FINDINGS Cardiovascular: Normal heart size. No pericardial effusion. Aorta and main pulmonary artery normal in caliber. Motion artifact limits evaluation. Within the above limitation no evidence  for pulmonary embolus. Mediastinum/Nodes: No axillary adenopathy. Enlarged mediastinal and bilateral hilar lymph nodes including a right peritracheal lymph node measuring 1.3 cm (image 29; series 4), a 1.4 cm subcarinal lymph node (image 45; series 4), a 0.9 cm left hilar lymph node (image 43; series 4) and a 1.0 cm right hilar lymph node (image 48; series 4). The esophagus is normal in appearance. Lungs/Pleura: Central  airways are patent. Bilateral predominately lower lung bronchial wall thickening is demonstrated. Patchy ground-glass opacities are demonstrated within the right upper lobe (image 23; series 9). No pleural effusion or pneumothorax. Musculoskeletal: No aggressive or acute appearing osseous lesions. Review of the MIP images confirms the above findings. CT ABDOMEN and PELVIS FINDINGS Hepatobiliary: Liver is normal in size and contour. No focal hepatic lesion is identified. Patient status post cholecystectomy. Pancreas: Unremarkable Spleen: Unremarkable Adrenals/Urinary Tract: The adrenal glands are normal. Kidneys enhance symmetrically with contrast. Unchanged cyst within the interpolar region of the left kidney. No hydronephrosis. Urinary bladder is unremarkable. Stomach/Bowel: Descending and sigmoid colonic diverticulosis. No CT evidence for acute diverticulitis. Fluid throughout the distal small bowel and colon. No evidence for bowel obstruction. Vascular/Lymphatic: Normal caliber abdominal aorta. Peripheral calcified atherosclerotic plaque. No retroperitoneal lymphadenopathy. Reproductive: Prostate is unremarkable. Other: None. Musculoskeletal: No aggressive or acute appearing osseous lesions. Lumbar spine degenerative changes. Review of the MIP images confirms the above findings. IMPRESSION: Motion artifact limits evaluation however no definite evidence for acute pulmonary embolus. Bilateral predominately lower lung bronchial wall thickening. Additionally there is patchy ground-glass consolidation within the right upper lobe. Findings are nonspecific however may be secondary to an infectious/ inflammatory process. Bronchitis is a consideration. Additionally, there are multiple prominent and enlarged mediastinal and hilar lymph nodes which may be reactive in etiology. Recommend follow-up chest CT in 6-8 weeks to assess for interval improvement/ resolution of pulmonary findings. No acute process within the abdomen  or pelvis. Fluid throughout the distal small bowel and colon compatible with clinical history of diarrhea. Electronically Signed   By: Lovey Newcomer M.D.   On: 02/03/2016 19:28    EKG: Independently reviewed.  Vent. rate 84 BPM PR interval * ms QRS duration 94 ms QT/QTc 374/443 ms P-R-T axes -5 72 75 Sinus rhythm RSR' in V1 or V2, right VCD or RVH ST elevation, consider inferior injury  Assessment/Plan Principal Problem:   Influenza A Admit to MedSurg/inpatient. Droplet precautions. Continue supplemental oxygen. Continue bronchodilators as needed. Continue Tamiflu 75 mg by mouth twice a day  Active Problems:   Bronchitis Continue bronchodilators. Continue Levaquin 750 mg IVPB every 24 hours.    Essential hypertension, benign Not currently on medication. Low-sodium diet. Monitor blood pressure.    Hypokalemia Replacing. Follow-up potassium level in a.m.      Anemia Monitor hematocrit and hemoglobin.    Hypogonadotropic hypogonadism ([place]) Per patient, he is not using AndroGel.     DVT prophylaxis: Lovenox SQ. Code Status: Full code. Family Communication: Disposition Plan: Admit for antibiotic therapy for 2-3 days.    Reubin Milan  Admitted for MD [facility] Pager [phone].  If 7PM-7AM, please contact night-coverage [email or web address] Password [facility]  02/03/2016, 8:39 PM   \

## 2016-02-03 NOTE — ED Triage Notes (Addendum)
Pt reports emesis and diarrhea for the past few days. Pt also has continued productive cough and SOB for the for the past 2 weeks.

## 2016-02-03 NOTE — ED Notes (Signed)
Pt's sister would like to be called and given the room number to where Pt is going her number is [phone] Butch Penny.

## 2016-02-04 DIAGNOSIS — J40 Bronchitis, not specified as acute or chronic: Secondary | ICD-10-CM

## 2016-02-04 DIAGNOSIS — J101 Influenza due to other identified influenza virus with other respiratory manifestations: Secondary | ICD-10-CM | POA: Diagnosis not present

## 2016-02-04 DIAGNOSIS — E230 Hypopituitarism: Secondary | ICD-10-CM | POA: Diagnosis not present

## 2016-02-04 DIAGNOSIS — I10 Essential (primary) hypertension: Secondary | ICD-10-CM

## 2016-02-04 DIAGNOSIS — E876 Hypokalemia: Secondary | ICD-10-CM

## 2016-02-04 LAB — COMPREHENSIVE METABOLIC PANEL
ALBUMIN: 2.7 g/dL — AB (ref 3.5–5.0)
ALT: 52 U/L (ref 17–63)
ANION GAP: 5 (ref 5–15)
AST: 44 U/L — ABNORMAL HIGH (ref 15–41)
Alkaline Phosphatase: 99 U/L (ref 38–126)
BILIRUBIN TOTAL: 0.4 mg/dL (ref 0.3–1.2)
BUN: 13 mg/dL (ref 6–20)
CO2: 23 mmol/L (ref 22–32)
Calcium: 7.7 mg/dL — ABNORMAL LOW (ref 8.9–10.3)
Chloride: 111 mmol/L (ref 101–111)
Creatinine, Ser: 0.71 mg/dL (ref 0.61–1.24)
GFR calc Af Amer: >60 mL/min (ref >60)
GFR calc non Af Amer: >60 mL/min (ref >60)
GLUCOSE: 95 mg/dL (ref 65–99)
POTASSIUM: 3.5 mmol/L (ref 3.5–5.1)
Sodium: 139 mmol/L (ref 135–145)
TOTAL PROTEIN: 5.3 g/dL — AB (ref 6.5–8.1)

## 2016-02-04 LAB — CBC WITH DIFFERENTIAL/PLATELET
BASOS PCT: 1 %
Basophils Absolute: 0.1 10*3/uL (ref 0.0–0.1)
EOS ABS: 0 10*3/uL (ref 0.0–0.7)
EOS PCT: 0 %
HEMATOCRIT: 30.3 % — AB (ref 39.0–52.0)
Hemoglobin: 10 g/dL — ABNORMAL LOW (ref 13.0–17.0)
Lymphocytes Relative: 37 %
Lymphs Abs: 3.1 10*3/uL (ref 0.7–4.0)
MCH: 30.2 pg (ref 26.0–34.0)
MCHC: 33 g/dL (ref 30.0–36.0)
MCV: 91.5 fL (ref 78.0–100.0)
MONO ABS: 1 10*3/uL (ref 0.1–1.0)
Monocytes Relative: 12 %
NEUTROS ABS: 4.2 10*3/uL (ref 1.7–7.7)
Neutrophils Relative %: 50 %
Platelets: 284 10*3/uL (ref 150–400)
RBC: 3.31 MIL/uL — ABNORMAL LOW (ref 4.22–5.81)
RDW: 14.1 % (ref 11.5–15.5)
WBC: 8.4 10*3/uL (ref 4.0–10.5)

## 2016-02-04 MED ORDER — ONDANSETRON HCL 4 MG PO TABS: 4.0000 mg | ORAL_TABLET | Freq: Four times a day (QID) | ORAL | Status: DC | PRN

## 2016-02-04 MED ORDER — ALBUTEROL SULFATE (2.5 MG/3ML) 0.083% IN NEBU
2.5000 mg | INHALATION_SOLUTION | Freq: Four times a day (QID) | RESPIRATORY_TRACT | Status: DC
  Administered 2016-02-04: 2.5 mg via RESPIRATORY_TRACT
  Filled 2016-02-04: qty 3

## 2016-02-04 MED ORDER — ALBUTEROL SULFATE (2.5 MG/3ML) 0.083% IN NEBU: 2.5000 mg | INHALATION_SOLUTION | RESPIRATORY_TRACT | Status: DC | PRN

## 2016-02-04 MED ORDER — IPRATROPIUM-ALBUTEROL 0.5-2.5 (3) MG/3ML IN SOLN
3.0000 mL | Freq: Two times a day (BID) | RESPIRATORY_TRACT | Status: DC
  Administered 2016-02-04 – 2016-02-05 (×3): 3 mL via RESPIRATORY_TRACT
  Filled 2016-02-04 (×3): qty 3

## 2016-02-04 MED ORDER — ONDANSETRON HCL 4 MG/2ML IJ SOLN
4.0000 mg | Freq: Four times a day (QID) | INTRAMUSCULAR | Status: DC | PRN
  Administered 2016-02-04: 4 mg via INTRAVENOUS
  Filled 2016-02-04: qty 2

## 2016-02-04 MED ORDER — ENOXAPARIN SODIUM 40 MG/0.4ML ~~LOC~~ SOLN
40.0000 mg | SUBCUTANEOUS | Status: DC
  Administered 2016-02-04 – 2016-02-05 (×2): 40 mg via SUBCUTANEOUS
  Filled 2016-02-04 (×2): qty 0.4

## 2016-02-04 MED ORDER — ACETAMINOPHEN 325 MG PO TABS
650.0000 mg | ORAL_TABLET | Freq: Four times a day (QID) | ORAL | Status: DC | PRN
  Administered 2016-02-04: 650 mg via ORAL
  Filled 2016-02-04: qty 2

## 2016-02-04 MED ORDER — HYDROCODONE-HOMATROPINE 5-1.5 MG/5ML PO SYRP
5.0000 mL | ORAL_SOLUTION | ORAL | Status: DC | PRN
Start: 2016-02-04 — End: 2016-02-05
  Administered 2016-02-04: 5 mL via ORAL
  Filled 2016-02-04: qty 5

## 2016-02-04 MED ORDER — LEVOFLOXACIN IN D5W 750 MG/150ML IV SOLN
750.0000 mg | INTRAVENOUS | Status: DC
  Administered 2016-02-04: 750 mg via INTRAVENOUS
  Filled 2016-02-04 (×2): qty 150

## 2016-02-04 MED ORDER — IPRATROPIUM BROMIDE 0.02 % IN SOLN
0.5000 mg | Freq: Four times a day (QID) | RESPIRATORY_TRACT | Status: DC
  Administered 2016-02-04: 0.5 mg via RESPIRATORY_TRACT
  Filled 2016-02-04: qty 2.5

## 2016-02-04 NOTE — Progress Notes (Signed)
Nutrition Brief Note  Patient identified on the Malnutrition Screening Tool (MST) Report  Wt Readings from Last 15 Encounters:  02/04/16 180 lb (81.6 kg)  01/21/16 180 lb (81.6 kg)  04/21/15 177 lb 8 oz (80.5 kg)  05/07/14 191 lb 6.4 oz (86.8 kg)  04/09/14 189 lb (85.7 kg)  04/06/14 189 lb (85.7 kg)  03/18/14 188 lb 9.6 oz (85.5 kg)    Body mass index is 28.62 kg/m. Patient meets criteria for overweight based on current BMI.   Current diet order is regular, patient is consuming approximately 100% of meals at this time. Labs and medications reviewed.   No nutrition interventions warranted at this time. If nutrition issues arise, please consult RD.   Koleen Distance, RD, LDN Pager #- [phone]

## 2016-02-04 NOTE — Progress Notes (Signed)
PROGRESS NOTE    Henry Schmitt   [id number]  DOB: [birth year]  DOA: 02/03/2016 PCP: Gerrit Heck, MD   Brief Narrative:  Henry Schmitt is a 56 y.o. male with medical history significant of hypertension, hypogonadotropic hypogonadism who was seen about 2 weeks ago for productive cough, dyspnea and wheezing. He was given prednisone, a Zithromax, cough syrup and albuterol inhaler which have not relieved his symptoms enough. He also states that in the past 4 days he has been having several episodes of emesis and diarrhea daily. He is found to be influenza positive.   Subjective: Diarrhea improved but has not resolved. Still has cough and congestion.   Assessment & Plan:   Principal Problem:   Influenza A with acute bronchitis - Tamiflu- Neb treatments as needed- Levaquin IV  Active Problems: Diarrhea - improving- patient has declined Lomotil - cont IVF    Hypogonadotropic hypogonadism  - med rec states he is not taking his Testosterone therefore, was not prescribed here    DVT prophylaxis: Lovenox Code Status: Full Family Communication:  Disposition Plan: home when stable Consultants:    Procedures:    Antimicrobials:  Anti-infectives    Start     Dose/Rate Route Frequency Ordered Stop   02/04/16 2000  levofloxacin (LEVAQUIN) IVPB 750 mg     750 mg 100 mL/hr over 90 Minutes Intravenous Every 24 hours 02/04/16 0048     02/03/16 2000  levofloxacin (LEVAQUIN) IVPB 750 mg     750 mg 100 mL/hr over 90 Minutes Intravenous  Once 02/03/16 1948 02/03/16 2330   02/03/16 2000  oseltamivir (TAMIFLU) capsule 75 mg     75 mg Oral 2 times daily 02/03/16 1948 02/08/16 2159       Objective: Vitals:   02/04/16 0604 02/04/16 0700 02/04/16 0853 02/04/16 0926  BP: 128/71     Pulse: 69     Resp: 18     Temp: 97.7 F (36.5 C)     TempSrc: Oral     SpO2: (!) 69% 94%  96%  Height:   5' 6.5" (1.689 m)     Intake/Output Summary (Last 24 hours) at 02/04/16 1247 Last  data filed at 02/04/16 1026  Gross per 24 hour  Intake          1173.33 ml  Output                0 ml  Net          1173.33 ml   There were no vitals filed for this visit.  Examination: General exam: Appears comfortable  HEENT: PERRLA, oral mucosa moist, no sclera icterus or thrush Respiratory system: mild rhonchi. Respiratory effort normal. Cardiovascular system: S1 & S2 heard, RRR.  No murmurs  Gastrointestinal system: Abdomen soft, non-tender, nondistended. Normal bowel sound. No organomegaly Central nervous system: Alert and oriented. No focal neurological deficits. Extremities: No cyanosis, clubbing or edema Skin: No rashes or ulcers Psychiatry:  Mood & affect appropriate.     Data Reviewed: I have personally reviewed following labs and imaging studies  CBC:  Recent Labs Lab 02/03/16 1436 02/04/16 0619  WBC 8.9 8.4  NEUTROABS  --  4.2  HGB 12.5* 10.0*  HCT 38.7* 30.3*  MCV 89.0 91.5  PLT 389 [id number]   Basic Metabolic Panel:  Recent Labs Lab 02/03/16 1436 02/04/16 0619  NA 137 139  K 3.1* 3.5  CL 102 111  CO2 25 23  GLUCOSE 113* 95  BUN 19 13  CREATININE  0.81 0.71  CALCIUM 8.8* 7.7*  MG 2.4  --    GFR: CrCl cannot be calculated (Unknown ideal weight.). Liver Function Tests:  Recent Labs Lab 02/03/16 1436 02/04/16 0619  AST 55* 44*  ALT 76* 52  ALKPHOS 136* 99  BILITOT 0.6 0.4  PROT 8.3* 5.3*  ALBUMIN 4.0 2.7*    Recent Labs Lab 02/03/16 1436  LIPASE 22   No results for input(s): AMMONIA in the last 168 hours. Coagulation Profile: No results for input(s): INR, PROTIME in the last 168 hours. Cardiac Enzymes: No results for input(s): CKTOTAL, CKMB, CKMBINDEX, TROPONINI in the last 168 hours. BNP (last 3 results) No results for input(s): PROBNP in the last 8760 hours. HbA1C: No results for input(s): HGBA1C in the last 72 hours. CBG: No results for input(s): GLUCAP in the last 168 hours. Lipid Profile: No results for input(s): CHOL,  HDL, LDLCALC, TRIG, CHOLHDL, LDLDIRECT in the last 72 hours. Thyroid Function Tests: No results for input(s): TSH, T4TOTAL, FREET4, T3FREE, THYROIDAB in the last 72 hours. Anemia Panel: No results for input(s): VITAMINB12, FOLATE, FERRITIN, TIBC, IRON, RETICCTPCT in the last 72 hours. Urine analysis:    Component Value Date/Time   COLORURINE YELLOW 02/03/2016 2117   APPEARANCEUR CLEAR 02/03/2016 2117   LABSPEC >1.046 (H) 02/03/2016 2117   PHURINE 5.0 02/03/2016 2117   GLUCOSEU NEGATIVE 02/03/2016 2117   HGBUR NEGATIVE 02/03/2016 2117   BILIRUBINUR NEGATIVE 02/03/2016 2117   KETONESUR 20 (A) 02/03/2016 2117   PROTEINUR NEGATIVE 02/03/2016 2117   UROBILINOGEN 0.2 02/10/2008 0229   NITRITE NEGATIVE 02/03/2016 2117   LEUKOCYTESUR NEGATIVE 02/03/2016 2117   Sepsis Labs: @LABRCNTIP (procalcitonin:4,lacticidven:4) ) Recent Results (from the past 240 hour(s))  Blood culture (routine x 2)     Status: None (Preliminary result)   Collection Time: 02/03/16  7:49 PM  Result Value Ref Range Status   Specimen Description BLOOD RIGHT ANTECUBITAL  Final   Special Requests BOTTLES DRAWN AEROBIC AND ANAEROBIC 5CC EACH  Final   Culture   Final    NO GROWTH < 12 HOURS Performed at [facility], [address] [place], [place] [address]    Report Status PENDING  Incomplete  Blood culture (routine x 2)     Status: None (Preliminary result)   Collection Time: 02/03/16  7:54 PM  Result Value Ref Range Status   Specimen Description BLOOD LEFT ANTECUBITAL  Final   Special Requests BOTTLES DRAWN AEROBIC AND ANAEROBIC 5CC EACH  Final   Culture   Final    NO GROWTH < 12 HOURS Performed at [facility], [place] [address], [place], [place] [address]    Report Status PENDING  Incomplete         Radiology Studies: Dg Chest 2 View  Result Date: 02/03/2016 CLINICAL DATA:  Emesis.  Diarrhea. EXAM: CHEST  2 VIEW COMPARISON:  01/21/2016. FINDINGS: Mediastinum and hilar structures normal. Low  lung volumes with mild basilar atelectasis. Heart size normal. No pleural effusion or pneumothorax. Surgical clips right upper quadrant. IMPRESSION: No acute cardiopulmonary disease. Electronically Signed   By: Marcello Moores  Register   On: 02/03/2016 14:22   Ct Angio Chest Pe W And/or Wo Contrast  Result Date: 02/03/2016 CLINICAL DATA:  Patient with emesis and diarrhea. Productive cough. Shortness of breath. EXAM: CT ANGIOGRAPHY CHEST CT ABDOMEN AND PELVIS WITH CONTRAST TECHNIQUE: Multidetector CT imaging of the chest was performed using the standard protocol during bolus administration of intravenous contrast. Multiplanar CT image reconstructions and MIPs  were obtained to evaluate the vascular anatomy. Multidetector CT imaging of the abdomen and pelvis was performed using the standard protocol during bolus administration of intravenous contrast. CONTRAST:  100 cc Isovue 370 COMPARISON:  CT abdomen pelvis 04/18/2015. FINDINGS: CTA CHEST FINDINGS Cardiovascular: Normal heart size. No pericardial effusion. Aorta and main pulmonary artery normal in caliber. Motion artifact limits evaluation. Within the above limitation no evidence for pulmonary embolus. Mediastinum/Nodes: No axillary adenopathy. Enlarged mediastinal and bilateral hilar lymph nodes including a right peritracheal lymph node measuring 1.3 cm (image 29; series 4), a 1.4 cm subcarinal lymph node (image 45; series 4), a 0.9 cm left hilar lymph node (image 43; series 4) and a 1.0 cm right hilar lymph node (image 48; series 4). The esophagus is normal in appearance. Lungs/Pleura: Central airways are patent. Bilateral predominately lower lung bronchial wall thickening is demonstrated. Patchy ground-glass opacities are demonstrated within the right upper lobe (image 23; series 9). No pleural effusion or pneumothorax. Musculoskeletal: No aggressive or acute appearing osseous lesions. Review of the MIP images confirms the above findings. CT ABDOMEN and PELVIS  FINDINGS Hepatobiliary: Liver is normal in size and contour. No focal hepatic lesion is identified. Patient status post cholecystectomy. Pancreas: Unremarkable Spleen: Unremarkable Adrenals/Urinary Tract: The adrenal glands are normal. Kidneys enhance symmetrically with contrast. Unchanged cyst within the interpolar region of the left kidney. No hydronephrosis. Urinary bladder is unremarkable. Stomach/Bowel: Descending and sigmoid colonic diverticulosis. No CT evidence for acute diverticulitis. Fluid throughout the distal small bowel and colon. No evidence for bowel obstruction. Vascular/Lymphatic: Normal caliber abdominal aorta. Peripheral calcified atherosclerotic plaque. No retroperitoneal lymphadenopathy. Reproductive: Prostate is unremarkable. Other: None. Musculoskeletal: No aggressive or acute appearing osseous lesions. Lumbar spine degenerative changes. Review of the MIP images confirms the above findings. IMPRESSION: Motion artifact limits evaluation however no definite evidence for acute pulmonary embolus. Bilateral predominately lower lung bronchial wall thickening. Additionally there is patchy ground-glass consolidation within the right upper lobe. Findings are nonspecific however may be secondary to an infectious/ inflammatory process. Bronchitis is a consideration. Additionally, there are multiple prominent and enlarged mediastinal and hilar lymph nodes which may be reactive in etiology. Recommend follow-up chest CT in 6-8 weeks to assess for interval improvement/ resolution of pulmonary findings. No acute process within the abdomen or pelvis. Fluid throughout the distal small bowel and colon compatible with clinical history of diarrhea. Electronically Signed   By: Lovey Newcomer M.D.   On: 02/03/2016 19:28   Ct Abdomen Pelvis W Contrast  Result Date: 02/03/2016 CLINICAL DATA:  Patient with emesis and diarrhea. Productive cough. Shortness of breath. EXAM: CT ANGIOGRAPHY CHEST CT ABDOMEN AND PELVIS  WITH CONTRAST TECHNIQUE: Multidetector CT imaging of the chest was performed using the standard protocol during bolus administration of intravenous contrast. Multiplanar CT image reconstructions and MIPs were obtained to evaluate the vascular anatomy. Multidetector CT imaging of the abdomen and pelvis was performed using the standard protocol during bolus administration of intravenous contrast. CONTRAST:  100 cc Isovue 370 COMPARISON:  CT abdomen pelvis 04/18/2015. FINDINGS: CTA CHEST FINDINGS Cardiovascular: Normal heart size. No pericardial effusion. Aorta and main pulmonary artery normal in caliber. Motion artifact limits evaluation. Within the above limitation no evidence for pulmonary embolus. Mediastinum/Nodes: No axillary adenopathy. Enlarged mediastinal and bilateral hilar lymph nodes including a right peritracheal lymph node measuring 1.3 cm (image 29; series 4), a 1.4 cm subcarinal lymph node (image 45; series 4), a 0.9 cm left hilar lymph node (image 43; series 4) and a 1.0  cm right hilar lymph node (image 48; series 4). The esophagus is normal in appearance. Lungs/Pleura: Central airways are patent. Bilateral predominately lower lung bronchial wall thickening is demonstrated. Patchy ground-glass opacities are demonstrated within the right upper lobe (image 23; series 9). No pleural effusion or pneumothorax. Musculoskeletal: No aggressive or acute appearing osseous lesions. Review of the MIP images confirms the above findings. CT ABDOMEN and PELVIS FINDINGS Hepatobiliary: Liver is normal in size and contour. No focal hepatic lesion is identified. Patient status post cholecystectomy. Pancreas: Unremarkable Spleen: Unremarkable Adrenals/Urinary Tract: The adrenal glands are normal. Kidneys enhance symmetrically with contrast. Unchanged cyst within the interpolar region of the left kidney. No hydronephrosis. Urinary bladder is unremarkable. Stomach/Bowel: Descending and sigmoid colonic diverticulosis. No CT  evidence for acute diverticulitis. Fluid throughout the distal small bowel and colon. No evidence for bowel obstruction. Vascular/Lymphatic: Normal caliber abdominal aorta. Peripheral calcified atherosclerotic plaque. No retroperitoneal lymphadenopathy. Reproductive: Prostate is unremarkable. Other: None. Musculoskeletal: No aggressive or acute appearing osseous lesions. Lumbar spine degenerative changes. Review of the MIP images confirms the above findings. IMPRESSION: Motion artifact limits evaluation however no definite evidence for acute pulmonary embolus. Bilateral predominately lower lung bronchial wall thickening. Additionally there is patchy ground-glass consolidation within the right upper lobe. Findings are nonspecific however may be secondary to an infectious/ inflammatory process. Bronchitis is a consideration. Additionally, there are multiple prominent and enlarged mediastinal and hilar lymph nodes which may be reactive in etiology. Recommend follow-up chest CT in 6-8 weeks to assess for interval improvement/ resolution of pulmonary findings. No acute process within the abdomen or pelvis. Fluid throughout the distal small bowel and colon compatible with clinical history of diarrhea. Electronically Signed   By: Lovey Newcomer M.D.   On: 02/03/2016 19:28      Scheduled Meds: . enoxaparin (LOVENOX) injection  40 mg Subcutaneous Q24H  . ipratropium-albuterol  3 mL Nebulization BID  . levofloxacin (LEVAQUIN) IV  750 mg Intravenous Q24H  . oseltamivir  75 mg Oral BID   Continuous Infusions: . 0.9 % NaCl with KCl 40 mEq / L 125 mL/hr (02/04/16 0700)     LOS: 1 day    Time spent in minutes: 63    [place], MD [facility] Pager: [email or web address] Password [facility] 02/04/2016, 12:47 PM

## 2016-02-05 DIAGNOSIS — E230 Hypopituitarism: Secondary | ICD-10-CM | POA: Diagnosis not present

## 2016-02-05 DIAGNOSIS — I10 Essential (primary) hypertension: Secondary | ICD-10-CM | POA: Diagnosis not present

## 2016-02-05 DIAGNOSIS — J40 Bronchitis, not specified as acute or chronic: Secondary | ICD-10-CM | POA: Diagnosis not present

## 2016-02-05 DIAGNOSIS — J101 Influenza due to other identified influenza virus with other respiratory manifestations: Secondary | ICD-10-CM | POA: Diagnosis not present

## 2016-02-05 LAB — CBC WITH DIFFERENTIAL/PLATELET
BASOS ABS: 0 10*3/uL (ref 0.0–0.1)
Basophils Relative: 1 %
EOS PCT: 0 %
Eosinophils Absolute: 0 10*3/uL (ref 0.0–0.7)
HCT: 30.2 % — ABNORMAL LOW (ref 39.0–52.0)
HEMOGLOBIN: 9.6 g/dL — AB (ref 13.0–17.0)
LYMPHS PCT: 41 %
Lymphs Abs: 2.7 10*3/uL (ref 0.7–4.0)
MCH: 28.6 pg (ref 26.0–34.0)
MCHC: 31.8 g/dL (ref 30.0–36.0)
MCV: 89.9 fL (ref 78.0–100.0)
Monocytes Absolute: 0.9 10*3/uL (ref 0.1–1.0)
Monocytes Relative: 13 %
NEUTROS PCT: 45 %
Neutro Abs: 3 10*3/uL (ref 1.7–7.7)
PLATELETS: 277 10*3/uL (ref 150–400)
RBC: 3.36 MIL/uL — AB (ref 4.22–5.81)
RDW: 14 % (ref 11.5–15.5)
WBC: 6.6 10*3/uL (ref 4.0–10.5)

## 2016-02-05 LAB — COMPREHENSIVE METABOLIC PANEL
ALT: 47 U/L (ref 17–63)
AST: 42 U/L — AB (ref 15–41)
Albumin: 2.9 g/dL — ABNORMAL LOW (ref 3.5–5.0)
Alkaline Phosphatase: 96 U/L (ref 38–126)
Anion gap: 7 (ref 5–15)
BUN: 6 mg/dL (ref 6–20)
CHLORIDE: 108 mmol/L (ref 101–111)
CO2: 23 mmol/L (ref 22–32)
CREATININE: 0.59 mg/dL — AB (ref 0.61–1.24)
Calcium: 8.1 mg/dL — ABNORMAL LOW (ref 8.9–10.3)
GFR calc Af Amer: >60 mL/min (ref >60)
Glucose, Bld: 101 mg/dL — ABNORMAL HIGH (ref 65–99)
Potassium: 4.1 mmol/L (ref 3.5–5.1)
Sodium: 138 mmol/L (ref 135–145)
Total Bilirubin: 0.7 mg/dL (ref 0.3–1.2)
Total Protein: 6.1 g/dL — ABNORMAL LOW (ref 6.5–8.1)

## 2016-02-05 MED ORDER — HYDROCODONE-HOMATROPINE 5-1.5 MG/5ML PO SYRP: 5.0000 mL | ORAL_SOLUTION | ORAL | 0 refills | Status: DC | PRN

## 2016-02-05 MED ORDER — OSELTAMIVIR PHOSPHATE 75 MG PO CAPS
75.0000 mg | ORAL_CAPSULE | Freq: Two times a day (BID) | ORAL | 0 refills | Status: DC
Start: 2016-02-05 — End: 2017-12-17

## 2016-02-05 MED ORDER — LEVOFLOXACIN 750 MG PO TABS: 750.0000 mg | ORAL_TABLET | Freq: Every day | ORAL | 0 refills | Status: DC

## 2016-02-05 MED ORDER — ACETAMINOPHEN 325 MG PO TABS: 650.0000 mg | ORAL_TABLET | Freq: Four times a day (QID) | ORAL | Status: DC | PRN

## 2016-02-05 NOTE — Discharge Summary (Signed)
Physician Discharge Summary  Henry Schmitt [id number] DOB: 01-14-1960 DOA: 02/03/2016  PCP: Gerrit Heck, MD  Admit date: 02/03/2016 Discharge date: 02/05/2016  Admitted From: home Disposition:  home   Recommendations for Outpatient Follow-up:  1. none  Home Health:  none  Equipment/Devices:  none    Discharge Condition:  stable   CODE STATUS:  Full code   Diet recommendation:  heart healthy Consultations:  none    Discharge Diagnoses:  Principal Problem:   Influenza A Active Problems:   Hypogonadotropic hypogonadism ([place])   Essential hypertension, benign   Hypokalemia   Bronchitis   Anemia    Subjective: Cough and congestion improving. No diarrhea or vomiting in > 24 hrs. Eating well. No complaints with ambulating.   Brief Summary: Henry Schmitt a 56 y.o. malewith medical history significant of hypertension, hypogonadotropic hypogonadism who was seen about 2 weeks ago for productive cough, dyspnea and wheezing. He was given prednisone, a Zithromax, cough syrup and albuterol inhaler which have not relieved his symptoms enough. He also states that in the past 4 days he has been having several episodes of emesis and diarrhea daily. He is found to be influenza positive.   Hospital Course:  Principal Problem:   Influenza A with acute bronchitis - Tamiflu- Neb treatments as needed- Levaquin IV- will switch to oral today- breathing quite well today.  Active Problems: Diarrhea - has resolved- eating and drinking well and able to maintain hydration with orals - ctop IVF today    Hypogonadotropic hypogonadism  -   he is not taking his Testosterone   Discharge Instructions  Discharge Instructions    Diet - low sodium heart healthy    Complete by:  As directed    Diet - low sodium heart healthy    Complete by:  As directed    Increase activity slowly    Complete by:  As directed    Increase activity slowly    Complete by:  As directed       Allergies as of 02/05/2016      Reactions   Antihistamines, Chlorpheniramine-type Hives, Swelling   Lisinopril Swelling   Peanut-containing Drug Products Swelling   ALL TREE NUTS      Medication List    TAKE these medications   acetaminophen 325 MG tablet Commonly known as:  TYLENOL Take 2 tablets (650 mg total) by mouth every 6 (six) hours as needed for mild pain, fever or headache.   albuterol 108 (90 Base) MCG/ACT inhaler Commonly known as:  PROVENTIL HFA;VENTOLIN HFA Inhale 2 puffs into the lungs every 4 (four) hours as needed for wheezing or shortness of breath.   DAYQUIL/NYQUIL COLD/FLU RELIEF PO Take 2 capsules by mouth every 6 (six) hours as needed (cold/cough).   DELSYM 30 MG/5ML liquid Generic drug:  dextromethorphan Take 30 mg by mouth every 6 (six) hours as needed for cough.   HYDROcodone-homatropine 5-1.5 MG/5ML syrup Commonly known as:  HYCODAN Take 5 mLs by mouth every 4 (four) hours as needed for cough.   ibuprofen 200 MG tablet Commonly known as:  ADVIL,MOTRIN Take 600 mg by mouth every 6 (six) hours as needed.   levofloxacin 750 MG tablet Commonly known as:  LEVAQUIN Take 1 tablet (750 mg total) by mouth daily.   oseltamivir 75 MG capsule Commonly known as:  TAMIFLU Take 1 capsule (75 mg total) by mouth 2 (two) times daily.       Allergies  Allergen Reactions  . Antihistamines, Chlorpheniramine-Type Hives and Swelling  .  Lisinopril Swelling  . Peanut-Containing Drug Products Swelling    ALL TREE NUTS     Procedures/Studies:    Dg Chest 2 View  Result Date: 02/03/2016 CLINICAL DATA:  Emesis.  Diarrhea. EXAM: CHEST  2 VIEW COMPARISON:  01/21/2016. FINDINGS: Mediastinum and hilar structures normal. Low lung volumes with mild basilar atelectasis. Heart size normal. No pleural effusion or pneumothorax. Surgical clips right upper quadrant. IMPRESSION: No acute cardiopulmonary disease. Electronically Signed   By: Marcello Moores  Register   On:  02/03/2016 14:22   Dg Chest 2 View  Result Date: 01/21/2016 CLINICAL DATA:  Cough, congestion, shortness of breath EXAM: CHEST  2 VIEW COMPARISON:  06/08/2008 FINDINGS: The heart size and mediastinal contours are within normal limits. Both lungs are clear. The visualized skeletal structures are unremarkable. IMPRESSION: No active cardiopulmonary disease. Electronically Signed   By: Kathreen Devoid   On: 01/21/2016 11:01   Ct Angio Chest Pe W And/or Wo Contrast  Result Date: 02/03/2016 CLINICAL DATA:  Patient with emesis and diarrhea. Productive cough. Shortness of breath. EXAM: CT ANGIOGRAPHY CHEST CT ABDOMEN AND PELVIS WITH CONTRAST TECHNIQUE: Multidetector CT imaging of the chest was performed using the standard protocol during bolus administration of intravenous contrast. Multiplanar CT image reconstructions and MIPs were obtained to evaluate the vascular anatomy. Multidetector CT imaging of the abdomen and pelvis was performed using the standard protocol during bolus administration of intravenous contrast. CONTRAST:  100 cc Isovue 370 COMPARISON:  CT abdomen pelvis 04/18/2015. FINDINGS: CTA CHEST FINDINGS Cardiovascular: Normal heart size. No pericardial effusion. Aorta and main pulmonary artery normal in caliber. Motion artifact limits evaluation. Within the above limitation no evidence for pulmonary embolus. Mediastinum/Nodes: No axillary adenopathy. Enlarged mediastinal and bilateral hilar lymph nodes including a right peritracheal lymph node measuring 1.3 cm (image 29; series 4), a 1.4 cm subcarinal lymph node (image 45; series 4), a 0.9 cm left hilar lymph node (image 43; series 4) and a 1.0 cm right hilar lymph node (image 48; series 4). The esophagus is normal in appearance. Lungs/Pleura: Central airways are patent. Bilateral predominately lower lung bronchial wall thickening is demonstrated. Patchy ground-glass opacities are demonstrated within the right upper lobe (image 23; series 9). No pleural  effusion or pneumothorax. Musculoskeletal: No aggressive or acute appearing osseous lesions. Review of the MIP images confirms the above findings. CT ABDOMEN and PELVIS FINDINGS Hepatobiliary: Liver is normal in size and contour. No focal hepatic lesion is identified. Patient status post cholecystectomy. Pancreas: Unremarkable Spleen: Unremarkable Adrenals/Urinary Tract: The adrenal glands are normal. Kidneys enhance symmetrically with contrast. Unchanged cyst within the interpolar region of the left kidney. No hydronephrosis. Urinary bladder is unremarkable. Stomach/Bowel: Descending and sigmoid colonic diverticulosis. No CT evidence for acute diverticulitis. Fluid throughout the distal small bowel and colon. No evidence for bowel obstruction. Vascular/Lymphatic: Normal caliber abdominal aorta. Peripheral calcified atherosclerotic plaque. No retroperitoneal lymphadenopathy. Reproductive: Prostate is unremarkable. Other: None. Musculoskeletal: No aggressive or acute appearing osseous lesions. Lumbar spine degenerative changes. Review of the MIP images confirms the above findings. IMPRESSION: Motion artifact limits evaluation however no definite evidence for acute pulmonary embolus. Bilateral predominately lower lung bronchial wall thickening. Additionally there is patchy ground-glass consolidation within the right upper lobe. Findings are nonspecific however may be secondary to an infectious/ inflammatory process. Bronchitis is a consideration. Additionally, there are multiple prominent and enlarged mediastinal and hilar lymph nodes which may be reactive in etiology. Recommend follow-up chest CT in 6-8 weeks to assess for interval improvement/ resolution of pulmonary  findings. No acute process within the abdomen or pelvis. Fluid throughout the distal small bowel and colon compatible with clinical history of diarrhea. Electronically Signed   By: Lovey Newcomer M.D.   On: 02/03/2016 19:28   Ct Abdomen Pelvis W  Contrast  Result Date: 02/03/2016 CLINICAL DATA:  Patient with emesis and diarrhea. Productive cough. Shortness of breath. EXAM: CT ANGIOGRAPHY CHEST CT ABDOMEN AND PELVIS WITH CONTRAST TECHNIQUE: Multidetector CT imaging of the chest was performed using the standard protocol during bolus administration of intravenous contrast. Multiplanar CT image reconstructions and MIPs were obtained to evaluate the vascular anatomy. Multidetector CT imaging of the abdomen and pelvis was performed using the standard protocol during bolus administration of intravenous contrast. CONTRAST:  100 cc Isovue 370 COMPARISON:  CT abdomen pelvis 04/18/2015. FINDINGS: CTA CHEST FINDINGS Cardiovascular: Normal heart size. No pericardial effusion. Aorta and main pulmonary artery normal in caliber. Motion artifact limits evaluation. Within the above limitation no evidence for pulmonary embolus. Mediastinum/Nodes: No axillary adenopathy. Enlarged mediastinal and bilateral hilar lymph nodes including a right peritracheal lymph node measuring 1.3 cm (image 29; series 4), a 1.4 cm subcarinal lymph node (image 45; series 4), a 0.9 cm left hilar lymph node (image 43; series 4) and a 1.0 cm right hilar lymph node (image 48; series 4). The esophagus is normal in appearance. Lungs/Pleura: Central airways are patent. Bilateral predominately lower lung bronchial wall thickening is demonstrated. Patchy ground-glass opacities are demonstrated within the right upper lobe (image 23; series 9). No pleural effusion or pneumothorax. Musculoskeletal: No aggressive or acute appearing osseous lesions. Review of the MIP images confirms the above findings. CT ABDOMEN and PELVIS FINDINGS Hepatobiliary: Liver is normal in size and contour. No focal hepatic lesion is identified. Patient status post cholecystectomy. Pancreas: Unremarkable Spleen: Unremarkable Adrenals/Urinary Tract: The adrenal glands are normal. Kidneys enhance symmetrically with contrast. Unchanged  cyst within the interpolar region of the left kidney. No hydronephrosis. Urinary bladder is unremarkable. Stomach/Bowel: Descending and sigmoid colonic diverticulosis. No CT evidence for acute diverticulitis. Fluid throughout the distal small bowel and colon. No evidence for bowel obstruction. Vascular/Lymphatic: Normal caliber abdominal aorta. Peripheral calcified atherosclerotic plaque. No retroperitoneal lymphadenopathy. Reproductive: Prostate is unremarkable. Other: None. Musculoskeletal: No aggressive or acute appearing osseous lesions. Lumbar spine degenerative changes. Review of the MIP images confirms the above findings. IMPRESSION: Motion artifact limits evaluation however no definite evidence for acute pulmonary embolus. Bilateral predominately lower lung bronchial wall thickening. Additionally there is patchy ground-glass consolidation within the right upper lobe. Findings are nonspecific however may be secondary to an infectious/ inflammatory process. Bronchitis is a consideration. Additionally, there are multiple prominent and enlarged mediastinal and hilar lymph nodes which may be reactive in etiology. Recommend follow-up chest CT in 6-8 weeks to assess for interval improvement/ resolution of pulmonary findings. No acute process within the abdomen or pelvis. Fluid throughout the distal small bowel and colon compatible with clinical history of diarrhea. Electronically Signed   By: Lovey Newcomer M.D.   On: 02/03/2016 19:28       Discharge Exam: Vitals:   02/04/16 2109 02/05/16 0432  BP: (!) 157/89 (!) 131/91  Pulse: 80 80  Resp: 18 18  Temp: 99.4 F (37.4 C) 98.4 F (36.9 C)   Vitals:   02/04/16 2017 02/04/16 2109 02/05/16 0432 02/05/16 0749  BP:  (!) 157/89 (!) 131/91   Pulse:  80 80   Resp:  18 18   Temp:  99.4 F (37.4 C) 98.4 F (36.9  C)   TempSrc:  Oral Oral   SpO2: 94% 96% 96% 95%  Weight:      Height:        General: Pt is alert, awake, not in acute  distress Cardiovascular: RRR, S1/S2 +, no rubs, no gallops Respiratory: CTA bilaterally, no wheezing, no rhonchi Abdominal: Soft, NT, ND, bowel sounds + Extremities: no edema, no cyanosis    The results of significant diagnostics from this hospitalization (including imaging, microbiology, ancillary and laboratory) are listed below for reference.     Microbiology: Recent Results (from the past 240 hour(s))  Blood culture (routine x 2)     Status: None (Preliminary result)   Collection Time: 02/03/16  7:49 PM  Result Value Ref Range Status   Specimen Description BLOOD RIGHT ANTECUBITAL  Final   Special Requests BOTTLES DRAWN AEROBIC AND ANAEROBIC 5CC EACH  Final   Culture   Final    NO GROWTH 2 DAYS Performed at [facility], [address] [place], [place] [address]    Report Status PENDING  Incomplete  Blood culture (routine x 2)     Status: None (Preliminary result)   Collection Time: 02/03/16  7:54 PM  Result Value Ref Range Status   Specimen Description BLOOD LEFT ANTECUBITAL  Final   Special Requests BOTTLES DRAWN AEROBIC AND ANAEROBIC 5CC EACH  Final   Culture   Final    NO GROWTH 2 DAYS Performed at [facility], [place] [address] [place], [place] [address]    Report Status PENDING  Incomplete     Labs: BNP (last 3 results) No results for input(s): BNP in the last 8760 hours. Basic Metabolic Panel:  Recent Labs Lab 02/03/16 1436 02/04/16 0619 02/05/16 0650  NA 137 139 138  K 3.1* 3.5 4.1  CL 102 111 108  CO2 25 23 23   GLUCOSE 113* 95 101*  BUN 19 13 6   CREATININE 0.81 0.71 0.59*  CALCIUM 8.8* 7.7* 8.1*  MG 2.4  --   --    Liver Function Tests:  Recent Labs Lab 02/03/16 1436 02/04/16 0619 02/05/16 0650  AST 55* 44* 42*  ALT 76* 52 47  ALKPHOS 136* 99 96  BILITOT 0.6 0.4 0.7  PROT 8.3* 5.3* 6.1*  ALBUMIN 4.0 2.7* 2.9*    Recent Labs Lab 02/03/16 1436  LIPASE 22   No results for input(s): AMMONIA in the last 168  hours. CBC:  Recent Labs Lab 02/03/16 1436 02/04/16 0619 02/05/16 0650  WBC 8.9 8.4 6.6  NEUTROABS  --  4.2 3.0  HGB 12.5* 10.0* 9.6*  HCT 38.7* 30.3* 30.2*  MCV 89.0 91.5 89.9  PLT 389 284 277   Cardiac Enzymes: No results for input(s): CKTOTAL, CKMB, CKMBINDEX, TROPONINI in the last 168 hours. BNP: Invalid input(s): POCBNP CBG: No results for input(s): GLUCAP in the last 168 hours. D-Dimer No results for input(s): DDIMER in the last 72 hours. Hgb A1c No results for input(s): HGBA1C in the last 72 hours. Lipid Profile No results for input(s): CHOL, HDL, LDLCALC, TRIG, CHOLHDL, LDLDIRECT in the last 72 hours. Thyroid function studies No results for input(s): TSH, T4TOTAL, T3FREE, THYROIDAB in the last 72 hours.  Invalid input(s): FREET3 Anemia work up No results for input(s): VITAMINB12, FOLATE, FERRITIN, TIBC, IRON, RETICCTPCT in the last 72 hours. Urinalysis    Component Value Date/Time   COLORURINE YELLOW 02/03/2016 2117   APPEARANCEUR CLEAR 02/03/2016 2117   LABSPEC >1.046 (H) 02/03/2016 2117   PHURINE  5.0 02/03/2016 2117   GLUCOSEU NEGATIVE 02/03/2016 2117   HGBUR NEGATIVE 02/03/2016 2117   BILIRUBINUR NEGATIVE 02/03/2016 2117   KETONESUR 20 (A) 02/03/2016 2117   PROTEINUR NEGATIVE 02/03/2016 2117   UROBILINOGEN 0.2 02/10/2008 0229   NITRITE NEGATIVE 02/03/2016 2117   LEUKOCYTESUR NEGATIVE 02/03/2016 2117   Sepsis Labs Invalid input(s): PROCALCITONIN,  WBC,  LACTICIDVEN Microbiology Recent Results (from the past 240 hour(s))  Blood culture (routine x 2)     Status: None (Preliminary result)   Collection Time: 02/03/16  7:49 PM  Result Value Ref Range Status   Specimen Description BLOOD RIGHT ANTECUBITAL  Final   Special Requests BOTTLES DRAWN AEROBIC AND ANAEROBIC 5CC EACH  Final   Culture   Final    NO GROWTH 2 DAYS Performed at [facility], [place] [address], [place], [place] [address]    Report Status PENDING  Incomplete  Blood culture  (routine x 2)     Status: None (Preliminary result)   Collection Time: 02/03/16  7:54 PM  Result Value Ref Range Status   Specimen Description BLOOD LEFT ANTECUBITAL  Final   Special Requests BOTTLES DRAWN AEROBIC AND ANAEROBIC 5CC EACH  Final   Culture   Final    NO GROWTH 2 DAYS Performed at [facility], [place] [address], [place], [place] [address]    Report Status PENDING  Incomplete     Time coordinating discharge: Over 30 minutes  SIGNED:   Debbe Odea, MD  [facility] 02/05/2016, 9:55 AM Pager   If 7PM-7AM, please contact night-coverage [email or web address] Password [email or web address]

## 2016-02-05 NOTE — Progress Notes (Signed)
Pt leaving this afternoon with his sister. Alert, oriented, and aware of discharge. Instructions given/explained with pt verbalizing understanding.

## 2016-02-08 LAB — CULTURE, BLOOD (ROUTINE X 2)
CULTURE: NO GROWTH
Culture: NO GROWTH

## 2017-12-16 ENCOUNTER — Other Ambulatory Visit: Payer: Self-pay

## 2017-12-16 ENCOUNTER — Encounter (HOSPITAL_COMMUNITY): Payer: Self-pay | Admitting: Emergency Medicine

## 2017-12-16 DIAGNOSIS — J069 Acute upper respiratory infection, unspecified: Secondary | ICD-10-CM | POA: Diagnosis not present

## 2017-12-16 DIAGNOSIS — R05 Cough: Secondary | ICD-10-CM | POA: Diagnosis present

## 2017-12-16 DIAGNOSIS — Z79899 Other long term (current) drug therapy: Secondary | ICD-10-CM | POA: Insufficient documentation

## 2017-12-16 DIAGNOSIS — I10 Essential (primary) hypertension: Secondary | ICD-10-CM | POA: Insufficient documentation

## 2017-12-16 DIAGNOSIS — F17200 Nicotine dependence, unspecified, uncomplicated: Secondary | ICD-10-CM | POA: Insufficient documentation

## 2017-12-16 MED ORDER — ACETAMINOPHEN 325 MG PO TABS
650.0000 mg | ORAL_TABLET | Freq: Once | ORAL | Status: AC | PRN
  Administered 2017-12-16: 650 mg via ORAL
  Filled 2017-12-16: qty 2

## 2017-12-16 NOTE — ED Triage Notes (Signed)
Patient c/o cough, congestion, and body aches since yesterday. Hypertensive in triage. States "I was managing my blood pressure so they took me off my meds" x3 years ago. Denies chest pain, SOB, weakness, and blurred vision.

## 2017-12-17 ENCOUNTER — Emergency Department (HOSPITAL_COMMUNITY)
Admission: EM | Admit: 2017-12-17 | Discharge: 2017-12-17 | Disposition: A | Discharge status: Home / Self Care | Payer: 59 | Attending: Emergency Medicine | Admitting: Emergency Medicine

## 2017-12-17 ENCOUNTER — Emergency Department (HOSPITAL_COMMUNITY): Payer: 59

## 2017-12-17 ENCOUNTER — Other Ambulatory Visit: Payer: Self-pay

## 2017-12-17 DIAGNOSIS — R509 Fever, unspecified: Secondary | ICD-10-CM

## 2017-12-17 DIAGNOSIS — J069 Acute upper respiratory infection, unspecified: Secondary | ICD-10-CM

## 2017-12-17 DIAGNOSIS — R030 Elevated blood-pressure reading, without diagnosis of hypertension: Secondary | ICD-10-CM

## 2017-12-17 LAB — INFLUENZA PANEL BY PCR (TYPE A & B)
INFLAPCR: NEGATIVE
Influenza B By PCR: NEGATIVE

## 2017-12-17 MED ORDER — BENZONATATE 100 MG PO CAPS: 100.0000 mg | ORAL_CAPSULE | Freq: Three times a day (TID) | ORAL | 0 refills | Status: DC | PRN

## 2017-12-17 MED ORDER — IBUPROFEN 800 MG PO TABS
800.0000 mg | ORAL_TABLET | Freq: Once | ORAL | Status: AC
  Administered 2017-12-17: 800 mg via ORAL
  Filled 2017-12-17: qty 1

## 2017-12-17 NOTE — Discharge Instructions (Signed)
We recommend 600 mg ibuprofen every 6 hours for management of body aches.  You may alternate this with Tylenol as needed for fever management.  You were prescribed a course of Tessalon to take as needed for cough.  Continue your other daily medications.  Follow-up with your primary care doctor for recheck of your blood pressure as well as to ensure resolution of your symptoms.  Return to the ED if symptoms persist or worsen.

## 2017-12-17 NOTE — ED Provider Notes (Signed)
[facility] DEPT Provider Note   CSN: [id number] Arrival date & time: 12/16/17  1951     History   Chief Complaint Chief Complaint  Patient presents with  . Cough  . Nasal Congestion  . Hypertension    HPI Henry Schmitt is a 57 y.o. male.  57 year old male with a history of essential hypertension presents to the ED for complaints of body aches, chills, cough.  Notes a chronic cough since being diagnosed with the flu 2 years ago.  This cough has been more frequent over the past 6 days.  Did receive a flu shot on Wednesday.  Has been doing progressively worse since receiving this vaccination.  Had a fever of 101 F in triage.  Body aches have improved since taking Tylenol.  Previously used Robitussin for management of cough.  Denies chest pain, shortness of breath, weakness, vomiting, diarrhea.  No reported sick contacts.  Took his home blood pressure and noted it to be elevated which was the primary motivating factor of the patient's visit today.  This has improved without intervention.  The history is provided by the patient. No language interpreter was used.  Cough   Hypertension     Past Medical History:  Diagnosis Date  . Hypertension     Patient Active Problem List   Diagnosis Date Noted  . Influenza A 02/03/2016  . Hypokalemia 02/03/2016  . Bronchitis 02/03/2016  . Anemia 02/03/2016  . 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 Abdominal pain 04/18/2015  . Essential hypertension, benign 04/18/2015  . Leukocytosis 05/07/2014  . Hypogonadotropic hypogonadism ([place]) 04/06/2014  . Gynecomastia, male 03/18/2014    Past Surgical History:  Procedure Laterality Date  . CHOLECYSTECTOMY          Home Medications    Prior to Admission medications   Medication Sig Start Date End Date Taking? Authorizing Provider  guaiFENesin (ROBITUSSIN) 100 MG/5ML SOLN Take 5 mLs by mouth every 4 (four) hours as needed for cough or to loosen phlegm.   Yes [provider]  acetaminophen (TYLENOL) 325 MG tablet Take 2 tablets (650 mg total) by mouth every 6 (six) hours as needed for mild pain, fever or headache. Patient not taking: Reported on 12/17/2017 02/05/16   Debbe Odea, MD  albuterol (PROVENTIL HFA;VENTOLIN HFA) 108 (90 Base) MCG/ACT inhaler Inhale 2 puffs into the lungs every 4 (four) hours as needed for wheezing or shortness of breath. Patient not taking: Reported on 12/17/2017 01/21/16   Fransico Meadow, PA-C  benzonatate (TESSALON) 100 MG capsule Take 1 capsule (100 mg total) by mouth 3 (three) times daily as needed for cough. 12/17/17   Antonietta Breach, PA-C    Family History Family History  Problem Relation Age of Onset  . Heart failure Mother   . Hypertension Mother   . Diabetes Other     Social History Social History   Tobacco Use  . Smoking status: Current Every Day Smoker    Packs/day: 0.50  . Smokeless tobacco: Never Used  Substance Use Topics  . Alcohol use: No  . Drug use: No     Allergies   Antihistamines, chlorpheniramine-type; Lisinopril; and Peanut-containing drug products   Review of Systems Review of Systems  Respiratory: Positive for cough.   Ten systems reviewed and are negative for acute change, except as noted in the HPI.    Physical Exam Updated Vital Signs BP 125/80   Pulse 77   Temp 99 F (37.2 C) (Oral)   Resp  17   Ht 5\' 6"  (1.676 m)   Wt 81.6 kg   SpO2 93%   BMI 29.05 kg/m   Physical Exam  Constitutional: He is oriented to person, place, and time. He appears well-developed and well-nourished. No distress.  Nontoxic appearing and in NAD  HENT:  Head: Normocephalic and atraumatic.  Patient tolerating secretions without difficulty. No tripoding or stridor.  Eyes: Conjunctivae and EOM are normal. No scleral icterus.  Neck: Normal range of motion.  Cardiovascular: Normal rate, regular rhythm and intact distal pulses.  Pulmonary/Chest: Effort normal. No stridor. No respiratory distress.  He has no wheezes. He has no rales.  Respirations even and unlabored.  Lungs clear bilaterally.  Musculoskeletal: Normal range of motion.  Neurological: He is alert and oriented to person, place, and time. He exhibits normal muscle tone. Coordination normal.  Skin: Skin is warm and dry. No rash noted. He is not diaphoretic. No erythema. No pallor.  Psychiatric: He has a normal mood and affect. His behavior is normal.  Nursing note and vitals reviewed.    ED Treatments / Results  Labs (all labs ordered are listed, but only abnormal results are displayed) Labs Reviewed  INFLUENZA PANEL BY PCR (TYPE A & B)    EKG None  Radiology Dg Chest 2 View  Result Date: 12/17/2017 CLINICAL DATA:  Initial evaluation for acute cough, congestion, body aches. EXAM: CHEST - 2 VIEW COMPARISON:  Prior radiograph and CT from 02/03/2016. FINDINGS: Cardiac and mediastinal silhouettes stable in size and contour, and remain within normal limits. Aortic atherosclerosis. Lungs mildly hypoinflated. Streaky left basilar opacity most consistent with atelectasis. Mild scattered peribronchial thickening, which could reflect acute bronchiolitis given provided history of cough. No focal infiltrates to suggest pneumonia. No pulmonary edema or pleural effusion. No pneumothorax. No acute osseous abnormality. IMPRESSION: 1. Mild scattered peribronchial thickening, which could reflect acute bronchiolitis given provided history of cough. No consolidative opacity to suggest pneumonia. 2. Mild left basilar atelectasis. 3. Aortic atherosclerosis. Electronically Signed   By: Jeannine Boga M.D.   On: 12/17/2017 01:03    Procedures Procedures (including critical care time)  Medications Ordered in ED Medications  acetaminophen (TYLENOL) tablet 650 mg (650 mg Oral Given 12/16/17 2124)  ibuprofen (ADVIL,MOTRIN) tablet 800 mg (800 mg Oral Given 12/17/17 0231)     Initial Impression / Assessment and Plan / ED Course  I have  reviewed the triage vital signs and the nursing notes.  Pertinent labs & imaging results that were available during my care of the patient were reviewed by me and considered in my medical decision making (see chart for details).     Pt CXR negative for acute infiltrate. Patient's symptoms are consistent with URI, likely viral etiology. Discussed that antibiotics are not indicated for viral infections. Pt will be discharged with symptomatic treatment. He verbalizes understanding and is agreeable with plan. Patient is hemodynamically stable and in NAD prior to discharge.  Vitals:   12/17/17 0028 12/17/17 0123 12/17/17 0125 12/17/17 0200  BP: (!) 146/81  136/68 125/80  Pulse: 78  71 77  Resp: 16  16 17   Temp: 99 F (37.2 C)     TempSrc: Oral     SpO2: 98%  96% 93%  Weight:  81.6 kg    Height:  5\' 6"  (1.676 m)      Final Clinical Impressions(s) / ED Diagnoses   Final diagnoses:  Febrile illness  Upper respiratory tract infection, unspecified type  Elevated blood pressure reading  ED Discharge Orders         Ordered    benzonatate (TESSALON) 100 MG capsule  3 times daily PRN     12/17/17 0216           Antonietta Breach, PA-C 12/17/17 0255    Ripley Fraise, MD 12/17/17 [phone]

## 2017-12-27 ENCOUNTER — Other Ambulatory Visit: Payer: Self-pay

## 2017-12-27 ENCOUNTER — Encounter (HOSPITAL_COMMUNITY): Payer: Self-pay | Admitting: Emergency Medicine

## 2017-12-27 ENCOUNTER — Ambulatory Visit (HOSPITAL_COMMUNITY)
Admission: EM | Admit: 2017-12-27 | Discharge: 2017-12-27 | Disposition: A | Discharge status: Home / Self Care | Payer: 59 | Attending: Family Medicine | Admitting: Family Medicine

## 2017-12-27 DIAGNOSIS — J22 Unspecified acute lower respiratory infection: Secondary | ICD-10-CM

## 2017-12-27 DIAGNOSIS — R059 Cough, unspecified: Secondary | ICD-10-CM

## 2017-12-27 DIAGNOSIS — R05 Cough: Secondary | ICD-10-CM | POA: Insufficient documentation

## 2017-12-27 MED ORDER — AZITHROMYCIN 250 MG PO TABS: ORAL_TABLET | ORAL | 0 refills | Status: DC

## 2017-12-27 MED ORDER — BENZONATATE 200 MG PO CAPS: 200.0000 mg | ORAL_CAPSULE | Freq: Two times a day (BID) | ORAL | 0 refills | Status: DC | PRN

## 2017-12-27 MED ORDER — ALBUTEROL SULFATE HFA 108 (90 BASE) MCG/ACT IN AERS: 2.0000 | INHALATION_SPRAY | RESPIRATORY_TRACT | 0 refills | Status: DC | PRN

## 2017-12-27 MED ORDER — PREDNISONE 20 MG PO TABS: 20.0000 mg | ORAL_TABLET | Freq: Two times a day (BID) | ORAL | 0 refills | Status: DC

## 2017-12-27 NOTE — Discharge Instructions (Signed)
Drink plenty of fluids Take the z pak as instructed ( antibiotic) Take the prednisone as directed ( anti inflammatory) Use albuterol as needed wheezing Use tessalon as needed cough Add a DM product to the tessalon ( mucinex DM, Delsym 12 hour product) See PCP if not improving in another week

## 2017-12-27 NOTE — ED Provider Notes (Signed)
[place]    CSN: [id number] Arrival date & time: 12/27/17  1247     History   Chief Complaint Chief Complaint  Patient presents with  . URI    HPI Henry Schmitt is a 57 y.o. male.   HPI  Patient states that he has had a cough for longer than 3 weeks.  He was initially seen at [facility] 10 days ago when he been coughing for several days.  Chest x-ray was negative.  They treated him symptomatically.  Diagnosed with an upper respiratory virus.  They did note that his blood pressure was elevated.  He went to see his PCP a couple days later.  The PCP started him on hydrochlorothiazide.  Listened to his lungs and told him to continue with symptomatic care.  He is here because he still is coughing.  The cough is gotten harsher.  More frequent.  He is not coughing up any phlegm or mucus.  He coughs so hard that he vomits.  He is feeling very tired.  He thinks he still has a low-grade fever.  His chest hurts from all the coughing.  Hurts with deep breath.  He is here because he wants to make sure that his infection has not "turned into pneumonia".  He has not been around anyone with pneumonia, strep, influenza.  He states he did get a flu shot.  Past Medical History:  Diagnosis Date  . Hypertension     Patient Active Problem List   Diagnosis Date Noted  . Influenza A 02/03/2016  . Hypokalemia 02/03/2016  . Bronchitis 02/03/2016  . Anemia 02/03/2016  . 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 Abdominal pain 04/18/2015  . Essential hypertension, benign 04/18/2015  . Leukocytosis 05/07/2014  . Hypogonadotropic hypogonadism ([place]) 04/06/2014  . Gynecomastia, male 03/18/2014    Past Surgical History:  Procedure Laterality Date  . CHOLECYSTECTOMY         Home Medications    Prior to Admission medications   Medication Sig Start Date End Date Taking? Authorizing Provider  hydrochlorothiazide (HYDRODIURIL) 12.5 MG tablet Take 12.5 mg by mouth daily.   Yes [provider]  albuterol (PROVENTIL HFA;VENTOLIN HFA) 108 (90 Base) MCG/ACT inhaler Inhale 2 puffs into the lungs every 4 (four) hours as needed for wheezing or shortness of breath. 12/27/17   Raylene Everts, MD  azithromycin (ZITHROMAX Z-PAK) 250 MG tablet Take two pills today followed by one a day until gone 12/27/17   Raylene Everts, MD  benzonatate (TESSALON) 200 MG capsule Take 1 capsule (200 mg total) by mouth 2 (two) times daily as needed for cough. 12/27/17   Raylene Everts, MD  predniSONE (DELTASONE) 20 MG tablet Take 1 tablet (20 mg total) by mouth 2 (two) times daily with a meal. 12/27/17   Raylene Everts, MD    Family History Family History  Problem Relation Age of Onset  . Heart failure Mother   . Hypertension Mother   . Diabetes Other     Social History Social History   Tobacco Use  . Smoking status: Current Every Day Smoker    Packs/day: 0.50  . Smokeless tobacco: Never Used  Substance Use Topics  . Alcohol use: No  . Drug use: No     Allergies   Antihistamines, chlorpheniramine-type; Lisinopril; and Peanut-containing drug products   Review of Systems Review of Systems  Constitutional: Positive for fatigue. Negative for chills and fever.  HENT: Negative for congestion, ear pain,  postnasal drip, rhinorrhea and sore throat.   Eyes: Negative for pain and visual disturbance.  Respiratory: Positive for cough. Negative for shortness of breath.   Cardiovascular: Negative for chest pain and palpitations.  Gastrointestinal: Positive for vomiting. Negative for abdominal pain and nausea.  Genitourinary: Negative for dysuria and hematuria.  Musculoskeletal: Negative for arthralgias and back pain.  Skin: Negative for color change and rash.  Neurological: Negative for seizures, syncope and headaches.  Psychiatric/Behavioral: Positive for sleep disturbance.  All other systems reviewed and are negative.    Physical Exam Triage Vital Signs ED Triage  Vitals  Enc Vitals Group     BP 12/27/17 1423 (!) 160/88     Pulse Rate 12/27/17 1423 98     Resp 12/27/17 1423 16     Temp 12/27/17 1423 99.4 F (37.4 C)     Temp Source 12/27/17 1423 Oral     SpO2 12/27/17 1423 99 %   No data found.  Updated Vital Signs BP (!) 160/88 (BP Location: Left Arm)   Pulse 98   Temp 99.4 F (37.4 C) (Oral)   Resp 16   SpO2 99%      Physical Exam Constitutional:      General: He is not in acute distress.    Appearance: He is well-developed. He is ill-appearing.     Comments: Appears tired.  Frequent harsh cough.  HENT:     Head: Normocephalic and atraumatic.     Right Ear: Tympanic membrane and ear canal normal.     Left Ear: Tympanic membrane and ear canal normal.     Nose: Nose normal. No congestion.     Mouth/Throat:     Mouth: Mucous membranes are moist.  Eyes:     Conjunctiva/sclera: Conjunctivae normal.     Pupils: Pupils are equal, round, and reactive to light.  Neck:     Musculoskeletal: Normal range of motion and neck supple.  Cardiovascular:     Rate and Rhythm: Normal rate and regular rhythm.     Heart sounds: Normal heart sounds.  Pulmonary:     Effort: Pulmonary effort is normal. No respiratory distress.     Breath sounds: Rhonchi present.     Comments: Few rhonchi anteriorly.  End inspiratory wheeze in right base, scattered Abdominal:     General: Bowel sounds are normal. There is no distension.     Palpations: Abdomen is soft.  Musculoskeletal: Normal range of motion.  Lymphadenopathy:     Cervical: No cervical adenopathy.  Skin:    General: Skin is warm and dry.     Findings: No rash.  Neurological:     General: No focal deficit present.     Mental Status: He is alert. Mental status is at baseline.  Psychiatric:        Mood and Affect: Mood normal.        Thought Content: Thought content normal.      UC Treatments / Results  Labs (all labs ordered are listed, but only abnormal results are displayed) Labs  Reviewed - No data to display  EKG None  Radiology No results found.  Procedures Procedures (including critical care time)  Medications Ordered in UC Medications - No data to display  Initial Impression / Assessment and Plan / UC Course  I have reviewed the triage vital signs and the nursing notes.  Pertinent labs & imaging results that were available during my care of the patient were reviewed by me and considered in my  medical decision making (see chart for details).     I reviewed with the patient that the CDC recommends not using antibiotics for viral infections.  A high percentage of these is cold and cough symptoms are caused by viruses.  Because his symptoms have been lasting longer than 10 days, he continues to complain of fever, and his symptoms are worsening we will give him a Z-Pak to try.  No guarantee that it will help.  Because of the wheezing I am giving him albuterol and prednisone.  We will refill his Tessalon.  He is told to rest, push fluids, use a humidifier if he has 1, see his PCP if not improving in another week Final Clinical Impressions(s) / UC Diagnoses   Final diagnoses:  Lower resp. tract infection  Cough     Discharge Instructions     Drink plenty of fluids Take the z pak as instructed ( antibiotic) Take the prednisone as directed ( anti inflammatory) Use albuterol as needed wheezing Use tessalon as needed cough Add a DM product to the tessalon ( mucinex DM, Delsym 12 hour product) See PCP if not improving in another week   ED Prescriptions    Medication Sig Dispense Auth. Provider   albuterol (PROVENTIL HFA;VENTOLIN HFA) 108 (90 Base) MCG/ACT inhaler Inhale 2 puffs into the lungs every 4 (four) hours as needed for wheezing or shortness of breath. 1 Inhaler Raylene Everts, MD   benzonatate (TESSALON) 200 MG capsule Take 1 capsule (200 mg total) by mouth 2 (two) times daily as needed for cough. 20 capsule Raylene Everts, MD   predniSONE  (DELTASONE) 20 MG tablet Take 1 tablet (20 mg total) by mouth 2 (two) times daily with a meal. 10 tablet Raylene Everts, MD   azithromycin (ZITHROMAX Z-PAK) 250 MG tablet Take two pills today followed by one a day until gone 6 tablet Raylene Everts, MD     Controlled Substance Prescriptions [place] Controlled Substance Registry consulted? Not Applicable   Raylene Everts, MD 12/27/17 [phone]

## 2017-12-27 NOTE — ED Triage Notes (Signed)
Cough for 3 weeks.  Runny nose has started about 3 days ago.  Patient reports blood pressure reading elevated.  pcp seen a few days after that and restarted on blood pressure medicine.  Meanwhile cough continues

## 2018-01-21 DIAGNOSIS — J209 Acute bronchitis, unspecified: Secondary | ICD-10-CM | POA: Diagnosis not present

## 2018-01-21 DIAGNOSIS — R740 Nonspecific elevation of levels of transaminase and lactic acid dehydrogenase [LDH]: Secondary | ICD-10-CM | POA: Diagnosis not present

## 2018-01-21 DIAGNOSIS — F17210 Nicotine dependence, cigarettes, uncomplicated: Secondary | ICD-10-CM | POA: Diagnosis not present

## 2018-09-17 ENCOUNTER — Other Ambulatory Visit: Payer: Self-pay

## 2018-09-17 ENCOUNTER — Encounter (HOSPITAL_COMMUNITY): Payer: Self-pay

## 2018-09-17 ENCOUNTER — Other Ambulatory Visit: Payer: Self-pay | Admitting: *Deleted

## 2018-09-17 ENCOUNTER — Emergency Department (HOSPITAL_COMMUNITY)
Admission: EM | Admit: 2018-09-17 | Discharge: 2018-09-17 | Disposition: A | Discharge status: Home / Self Care | Payer: 59 | Attending: Emergency Medicine | Admitting: Emergency Medicine

## 2018-09-17 ENCOUNTER — Emergency Department (HOSPITAL_COMMUNITY): Payer: 59

## 2018-09-17 DIAGNOSIS — R05 Cough: Secondary | ICD-10-CM | POA: Diagnosis present

## 2018-09-17 DIAGNOSIS — I10 Essential (primary) hypertension: Secondary | ICD-10-CM | POA: Insufficient documentation

## 2018-09-17 DIAGNOSIS — J069 Acute upper respiratory infection, unspecified: Secondary | ICD-10-CM | POA: Insufficient documentation

## 2018-09-17 DIAGNOSIS — F17210 Nicotine dependence, cigarettes, uncomplicated: Secondary | ICD-10-CM | POA: Insufficient documentation

## 2018-09-17 DIAGNOSIS — Z20822 Contact with and (suspected) exposure to covid-19: Secondary | ICD-10-CM

## 2018-09-17 DIAGNOSIS — R062 Wheezing: Secondary | ICD-10-CM

## 2018-09-17 LAB — MAGNESIUM: Magnesium: 2.3 mg/dL (ref 1.7–2.4)

## 2018-09-17 MED ORDER — IPRATROPIUM BROMIDE HFA 17 MCG/ACT IN AERS
2.0000 | INHALATION_SPRAY | Freq: Once | RESPIRATORY_TRACT | Status: AC
  Administered 2018-09-17: 14:00:00 2 via RESPIRATORY_TRACT
  Filled 2018-09-17: qty 12.9

## 2018-09-17 MED ORDER — METHYLPREDNISOLONE SODIUM SUCC 125 MG IJ SOLR
125.0000 mg | Freq: Once | INTRAMUSCULAR | Status: AC
  Administered 2018-09-17: 125 mg via INTRAVENOUS
  Filled 2018-09-17: qty 2

## 2018-09-17 MED ORDER — ALBUTEROL SULFATE HFA 108 (90 BASE) MCG/ACT IN AERS
6.0000 | INHALATION_SPRAY | Freq: Once | RESPIRATORY_TRACT | Status: AC
  Administered 2018-09-17: 6 via RESPIRATORY_TRACT
  Filled 2018-09-17: qty 6.7

## 2018-09-17 MED ORDER — METHYLPREDNISOLONE 4 MG PO TBPK: ORAL_TABLET | ORAL | 0 refills | Status: DC

## 2018-09-17 MED ORDER — AEROCHAMBER PLUS FLO-VU MISC
1.0000 | Freq: Once | Status: AC
  Administered 2018-09-17: 1
  Filled 2018-09-17: qty 1

## 2018-09-17 NOTE — ED Provider Notes (Signed)
[place] DEPT Provider Note   CSN: [id number] Arrival date & time: 09/17/18  1145     History   Chief Complaint Chief Complaint  Patient presents with  . Cough  . Shortness of Breath  . Generalized Body Aches    HPI Henry Schmitt is a 58 y.o. male.who presents with cc of URI symptoms and wheezing.  He is a current daily smoker.  5 days ago he began having body aches, chills, cough, headaches.  He had outpatient testing for coronavirus prior to arrival.  He states he has had problems with asthma in the past but has never had to be hospitalized or intubated and has not had wheezing as bad as he has today.  He is never had to take steroid medications for his asthma in the past.  He states that he feels winded when he walks.  He has taken DayQuil and NyQuil with minimal relief of his symptoms.  He is a current daily smoker     HPI  Past Medical History:  Diagnosis Date  . Hypertension     Patient Active Problem List   Diagnosis Date Noted  . Influenza A 02/03/2016  . Hypokalemia 02/03/2016  . Bronchitis 02/03/2016  . Anemia 02/03/2016  . 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 Abdominal pain 04/18/2015  . Essential hypertension, benign 04/18/2015  . Leukocytosis 05/07/2014  . Hypogonadotropic hypogonadism ([place]) 04/06/2014  . Gynecomastia, male 03/18/2014    Past Surgical History:  Procedure Laterality Date  . CHOLECYSTECTOMY          Home Medications    Prior to Admission medications   Medication Sig Start Date End Date Taking? Authorizing Provider  albuterol (PROVENTIL HFA;VENTOLIN HFA) 108 (90 Base) MCG/ACT inhaler Inhale 2 puffs into the lungs every 4 (four) hours as needed for wheezing or shortness of breath. Patient not taking: Reported on 09/17/2018 12/27/17   Raylene Everts, MD  benzonatate (TESSALON) 200 MG capsule Take 1 capsule (200 mg total) by mouth 2 (two) times daily as needed for cough. Patient not taking: Reported on 09/17/2018  12/27/17   Raylene Everts, MD    Family History Family History  Problem Relation Age of Onset  . Heart failure Mother   . Hypertension Mother   . Diabetes Other     Social History Social History   Tobacco Use  . Smoking status: Current Every Day Smoker    Packs/day: 0.50  . Smokeless tobacco: Never Used  Substance Use Topics  . Alcohol use: No  . Drug use: No     Allergies   Antihistamines, chlorpheniramine-type; Lisinopril; and Peanut-containing drug products   Review of Systems Review of Systems  Ten systems reviewed and are negative for acute change, except as noted in the HPI.   Physical Exam Updated Vital Signs BP (!) 164/82   Pulse 74   Temp 98.4 F (36.9 C) (Oral)   Resp 18   Ht 5\' 6"  (1.676 m)   Wt 80.7 kg   SpO2 98%   BMI 28.73 kg/m   Physical Exam Vitals signs and nursing note reviewed.  Constitutional:      General: He is not in acute distress.    Appearance: He is well-developed. He is not diaphoretic.  HENT:     Head: Normocephalic and atraumatic.  Eyes:     General: No scleral icterus.    Conjunctiva/sclera: Conjunctivae normal.  Neck:     Musculoskeletal: Normal range of motion and neck supple.  Cardiovascular:     Rate and Rhythm: Normal rate and regular rhythm.     Heart sounds: Normal heart sounds.  Pulmonary:     Effort: Pulmonary effort is normal. No respiratory distress.     Breath sounds: Normal breath sounds.  Abdominal:     Palpations: Abdomen is soft.     Tenderness: There is no abdominal tenderness.  Skin:    General: Skin is warm and dry.  Neurological:     Mental Status: He is alert.  Psychiatric:        Behavior: Behavior normal.      ED Treatments / Results  Labs (all labs ordered are listed, but only abnormal results are displayed) Labs Reviewed  MAGNESIUM    EKG None  Radiology No results found.  Procedures Procedures (including critical care time)  Medications Ordered in ED Medications   methylPREDNISolone sodium succinate (SOLU-MEDROL) 125 mg/2 mL injection 125 mg (has no administration in time range)  albuterol (VENTOLIN HFA) 108 (90 Base) MCG/ACT inhaler 6 puff (has no administration in time range)  ipratropium (ATROVENT HFA) inhaler 2 puff (has no administration in time range)  aerochamber plus with mask device 1 each (has no administration in time range)     Initial Impression / Assessment and Plan / ED Course  I have reviewed the triage vital signs and the nursing notes.  Pertinent labs & imaging results that were available during my care of the patient were reviewed by me and considered in my medical decision making (see chart for details).  Clinical Course as of Sep 16 1909  Tue Sep 17, 2018  1542 Patient feels greatly improved. Minimal expiratory wheeze presently   [AH]    Clinical Course User Index [AH] Margarita Mail, PA-C       58 year old male who presents emergency department with chief complaint of shortness of breath.  COVID test is pending.  Oxygen saturations above 90% the entire time.  Patient had significant wheezing that improved with treatment.  Feeling   much better at this time.  Will discharge with prednisone taper, albuterol, symptomatic treatment, home isolation precautions.  Discussed return precautions.        Final Clinical Impressions(s) / ED Diagnoses   Final diagnoses:  None    ED Discharge Orders    None       Margarita Mail, PA-C 09/17/18 1915    Lajean Saver, MD 09/18/18 1429

## 2018-09-17 NOTE — ED Triage Notes (Addendum)
Patient c/o cough, shob, runny nose, chills, and body aches.  Patient reports emesis X2 over the weekend.  Denies N/V or diarrhea here today.   Patient states he had a covid test today at [facility].     A/Ox4 No distress during triage.

## 2018-09-17 NOTE — Discharge Instructions (Addendum)
Use 2 puffs in the albuterol in your spacer every 4-6 hours. Stay at home on isolation until your COVID tests result. You appear to have an upper respiratory infection (URI). An upper respiratory tract infection, or cold, is a viral infection of the air passages leading to the lungs. It is contagious and can be spread to others, especially during the first 3 or 4 days. It cannot be cured by antibiotics or other medicines. RETURN IMMEDIATELY IF you develop shortness of breath, confusion or altered mental status, a new rash, become dizzy, faint, or poorly responsive, or are unable to be cared for at home.     Person Under Monitoring Name: Henry Schmitt  Location: [place] [address]   Infection Prevention Recommendations for Individuals Confirmed to have, or Being Evaluated for, 2019 Novel Coronavirus (COVID-19) Infection Who Receive Care at Home  Individuals who are confirmed to have, or are being evaluated for, COVID-19 should follow the prevention steps below until a healthcare provider or local or state health department says they can return to normal activities.  Stay home except to get medical care You should restrict activities outside your home, except for getting medical care. Do not go to work, school, or public areas, and do not use public transportation or taxis.  Call ahead before visiting your doctor Before your medical appointment, call the healthcare provider and tell them that you have, or are being evaluated for, COVID-19 infection. This will help the healthcare providers office take steps to keep other people from getting infected. Ask your healthcare provider to call the local or state health department.  Monitor your symptoms Seek prompt medical attention if your illness is worsening (e.g., difficulty breathing). Before going to your medical appointment, call the healthcare provider and tell them that you have, or are being evaluated for,  COVID-19 infection. Ask your healthcare provider to call the local or state health department.  Wear a facemask You should wear a facemask that covers your nose and mouth when you are in the same room with other people and when you visit a healthcare provider. People who live with or visit you should also wear a facemask while they are in the same room with you.  Separate yourself from other people in your home As much as possible, you should stay in a different room from other people in your home. Also, you should use a separate bathroom, if available.  Avoid sharing household items You should not share dishes, drinking glasses, cups, eating utensils, towels, bedding, or other items with other people in your home. After using these items, you should wash them thoroughly with soap and water.  Cover your coughs and sneezes Cover your mouth and nose with a tissue when you cough or sneeze, or you can cough or sneeze into your sleeve. Throw used tissues in a lined trash can, and immediately wash your hands with soap and water for at least 20 seconds or use an alcohol-based hand rub.  Wash your [facility] your hands often and thoroughly with soap and water for at least 20 seconds. You can use an alcohol-based hand sanitizer if soap and water are not available and if your hands are not visibly dirty. Avoid touching your eyes, nose, and mouth with unwashed hands.   Prevention Steps for Caregivers and Household Members of Individuals Confirmed to have, or Being Evaluated for, COVID-19 Infection Being Cared for in the Home  If you live with, or provide care  at home for, a person confirmed to have, or being evaluated for, COVID-19 infection please follow these guidelines to prevent infection:  Follow healthcare providers instructions Make sure that you understand and can help the patient follow any healthcare provider instructions for all care.  Provide for the patients basic needs You  should help the patient with basic needs in the home and provide support for getting groceries, prescriptions, and other personal needs.  Monitor the patients symptoms If they are getting sicker, call his or her medical provider and tell them that the patient has, or is being evaluated for, COVID-19 infection. This will help the healthcare providers office take steps to keep other people from getting infected. Ask the healthcare provider to call the local or state health department.  Limit the number of people who have contact with the patient If possible, have only one caregiver for the patient. Other household members should stay in another home or place of residence. If this is not possible, they should stay in another room, or be separated from the patient as much as possible. Use a separate bathroom, if available. Restrict visitors who do not have an essential need to be in the home.  Keep older adults, very young children, and other sick people away from the patient Keep older adults, very young children, and those who have compromised immune systems or chronic health conditions away from the patient. This includes people with chronic heart, lung, or kidney conditions, diabetes, and cancer.  Ensure good ventilation Make sure that shared spaces in the home have good air flow, such as from an air conditioner or an opened window, weather permitting.  Wash your hands often Wash your hands often and thoroughly with soap and water for at least 20 seconds. You can use an alcohol based hand sanitizer if soap and water are not available and if your hands are not visibly dirty. Avoid touching your eyes, nose, and mouth with unwashed hands. Use disposable paper towels to dry your hands. If not available, use dedicated cloth towels and replace them when they become wet.  Wear a facemask and gloves Wear a disposable facemask at all times in the room and gloves when you touch or have contact  with the patients blood, body fluids, and/or secretions or excretions, such as sweat, saliva, sputum, nasal mucus, vomit, urine, or feces.  Ensure the mask fits over your nose and mouth tightly, and do not touch it during use. Throw out disposable facemasks and gloves after using them. Do not reuse. Wash your hands immediately after removing your facemask and gloves. If your personal clothing becomes contaminated, carefully remove clothing and launder. Wash your hands after handling contaminated clothing. Place all used disposable facemasks, gloves, and other waste in a lined container before disposing them with other household waste. Remove gloves and wash your hands immediately after handling these items.  Do not share dishes, glasses, or other household items with the patient Avoid sharing household items. You should not share dishes, drinking glasses, cups, eating utensils, towels, bedding, or other items with a patient who is confirmed to have, or being evaluated for, COVID-19 infection. After the person uses these items, you should wash them thoroughly with soap and water.  Wash laundry thoroughly Immediately remove and wash clothes or bedding that have blood, body fluids, and/or secretions or excretions, such as sweat, saliva, sputum, nasal mucus, vomit, urine, or feces, on them. Wear gloves when handling laundry from the patient. Read and follow directions on  labels of laundry or clothing items and detergent. In general, wash and dry with the warmest temperatures recommended on the label.  Clean all areas the individual has used often Clean all touchable surfaces, such as counters, tabletops, doorknobs, bathroom fixtures, toilets, phones, keyboards, tablets, and bedside tables, every day. Also, clean any surfaces that may have blood, body fluids, and/or secretions or excretions on them. Wear gloves when cleaning surfaces the patient has come in contact with. Use a diluted bleach solution  (e.g., dilute bleach with 1 part bleach and 10 parts water) or a household disinfectant with a label that says EPA-registered for coronaviruses. To make a bleach solution at home, add 1 tablespoon of bleach to 1 quart (4 cups) of water. For a larger supply, add  cup of bleach to 1 gallon (16 cups) of water. Read labels of cleaning products and follow recommendations provided on product labels. Labels contain instructions for safe and effective use of the cleaning product including precautions you should take when applying the product, such as wearing gloves or eye protection and making sure you have good ventilation during use of the product. Remove gloves and wash hands immediately after cleaning.  Monitor yourself for signs and symptoms of illness Caregivers and household members are considered close contacts, should monitor their health, and will be asked to limit movement outside of the home to the extent possible. Follow the monitoring steps for close contacts listed on the symptom monitoring form.   ? If you have additional questions, contact your local health department or call the epidemiologist on call at [phone] (available 24/7). ? This guidance is subject to change. For the most up-to-date guidance from [facility], please refer to their website: [email or web address]

## 2018-09-19 LAB — NOVEL CORONAVIRUS, NAA: SARS-CoV-2, NAA: NOT DETECTED

## 2019-04-05 ENCOUNTER — Ambulatory Visit: Payer: Self-pay | Attending: Internal Medicine

## 2019-04-05 DIAGNOSIS — Z23 Encounter for immunization: Secondary | ICD-10-CM

## 2019-04-05 NOTE — Progress Notes (Signed)
   Covid-19 Vaccination Clinic  Name:  Henry Schmitt    MRN: [id number] DOB: 02/10/1960  04/05/2019  [name] was observed post Covid-19 immunization for 15 minutes without incident. He was provided with Vaccine Information Sheet and instruction to access the V-Safe system.   [name] was instructed to call 911 with any severe reactions post vaccine: Marland Kitchen Difficulty breathing  . Swelling of face and throat  . A fast heartbeat  . A bad rash all over body  . Dizziness and weakness   Immunizations Administered    Name Date Dose VIS Date Route   Pfizer COVID-19 Vaccine 04/05/2019  2:27 PM 0.3 mL 12/20/2018 Intramuscular   Manufacturer: [place]   Lot: [id number]   [place]: [id number]

## 2019-04-29 ENCOUNTER — Ambulatory Visit: Payer: Self-pay | Attending: Internal Medicine

## 2019-04-29 DIAGNOSIS — Z23 Encounter for immunization: Secondary | ICD-10-CM

## 2019-04-29 NOTE — Progress Notes (Signed)
   Covid-19 Vaccination Clinic  Name:  Jaevin Canal    MRN: [id number] DOB: 08/25/1960  04/29/2019  Mr. Chisman was observed post Covid-19 immunization for 15 minutes without incident. He was provided with Vaccine Information Sheet and instruction to access the V-Safe system.   Mr. Croney was instructed to call 911 with any severe reactions post vaccine: Marland Kitchen Difficulty breathing  . Swelling of face and throat  . A fast heartbeat  . A bad rash all over body  . Dizziness and weakness   Immunizations Administered    Name Date Dose VIS Date Route   Pfizer COVID-19 Vaccine 04/29/2019  3:03 PM 0.3 mL 03/05/2018 Intramuscular   Manufacturer: [place]   Lot: [id number]   Ked[place]: [id number]

## 2019-10-30 ENCOUNTER — Encounter (HOSPITAL_COMMUNITY): Payer: Self-pay

## 2019-10-30 ENCOUNTER — Other Ambulatory Visit: Payer: Self-pay

## 2019-10-30 DIAGNOSIS — I10 Essential (primary) hypertension: Secondary | ICD-10-CM | POA: Insufficient documentation

## 2019-10-30 DIAGNOSIS — R059 Cough, unspecified: Secondary | ICD-10-CM | POA: Diagnosis not present

## 2019-10-30 DIAGNOSIS — D72829 Elevated white blood cell count, unspecified: Secondary | ICD-10-CM | POA: Insufficient documentation

## 2019-10-30 DIAGNOSIS — Z5321 Procedure and treatment not carried out due to patient leaving prior to being seen by health care provider: Secondary | ICD-10-CM | POA: Insufficient documentation

## 2019-10-30 DIAGNOSIS — Z91010 Allergy to peanuts: Secondary | ICD-10-CM | POA: Insufficient documentation

## 2019-10-30 DIAGNOSIS — F17210 Nicotine dependence, cigarettes, uncomplicated: Secondary | ICD-10-CM | POA: Diagnosis not present

## 2019-10-30 DIAGNOSIS — R509 Fever, unspecified: Secondary | ICD-10-CM | POA: Diagnosis not present

## 2019-10-30 DIAGNOSIS — M7918 Myalgia, other site: Secondary | ICD-10-CM | POA: Insufficient documentation

## 2019-10-30 DIAGNOSIS — R1032 Left lower quadrant pain: Secondary | ICD-10-CM | POA: Diagnosis present

## 2019-10-30 DIAGNOSIS — K5721 Diverticulitis of large intestine with perforation and abscess with bleeding: Secondary | ICD-10-CM | POA: Diagnosis not present

## 2019-10-30 DIAGNOSIS — R112 Nausea with vomiting, unspecified: Secondary | ICD-10-CM | POA: Insufficient documentation

## 2019-10-30 LAB — COMPREHENSIVE METABOLIC PANEL
ALT: 81 U/L — ABNORMAL HIGH (ref 0–44)
AST: 29 U/L (ref 15–41)
Albumin: 3.8 g/dL (ref 3.5–5.0)
Alkaline Phosphatase: 124 U/L (ref 38–126)
Anion gap: 9 (ref 5–15)
BUN: 12 mg/dL (ref 6–20)
CO2: 25 mmol/L (ref 22–32)
Calcium: 9 mg/dL (ref 8.9–10.3)
Chloride: 104 mmol/L (ref 98–111)
Creatinine, Ser: 0.71 mg/dL (ref 0.61–1.24)
GFR, Estimated: >60 mL/min (ref >60)
Glucose, Bld: 111 mg/dL — ABNORMAL HIGH (ref 70–99)
Potassium: 3.6 mmol/L (ref 3.5–5.1)
Sodium: 138 mmol/L (ref 135–145)
Total Bilirubin: 0.3 mg/dL (ref 0.3–1.2)
Total Protein: 7.4 g/dL (ref 6.5–8.1)

## 2019-10-30 LAB — CBC
HCT: 39.5 % (ref 39.0–52.0)
Hemoglobin: 12.5 g/dL — ABNORMAL LOW (ref 13.0–17.0)
MCH: 30 pg (ref 26.0–34.0)
MCHC: 31.6 g/dL (ref 30.0–36.0)
MCV: 95 fL (ref 80.0–100.0)
Platelets: 393 10*3/uL (ref 150–400)
RBC: 4.16 MIL/uL — ABNORMAL LOW (ref 4.22–5.81)
RDW: 13.2 % (ref 11.5–15.5)
WBC: 18.5 10*3/uL — ABNORMAL HIGH (ref 4.0–10.5)
nRBC: 0 % (ref 0.0–0.2)

## 2019-10-30 LAB — LIPASE, BLOOD: Lipase: 27 U/L (ref 11–51)

## 2019-10-30 NOTE — ED Triage Notes (Signed)
Patient arrived with complaints of left lower abdominal pain since Sunday. Reports being placed on antibiotics due to concern for diverticulitis. Also has complaints of NV.

## 2019-10-31 ENCOUNTER — Emergency Department (HOSPITAL_COMMUNITY)
Admission: EM | Admit: 2019-10-31 | Discharge: 2019-10-31 | Disposition: A | Discharge status: Home / Self Care | Payer: BC Managed Care – PPO | Attending: Emergency Medicine | Admitting: Emergency Medicine

## 2019-10-31 ENCOUNTER — Encounter (HOSPITAL_COMMUNITY): Payer: Self-pay

## 2019-10-31 ENCOUNTER — Emergency Department (HOSPITAL_COMMUNITY)
Admission: EM | Admit: 2019-10-31 | Discharge: 2019-10-31 | Disposition: A | Discharge status: Home / Self Care | Payer: BC Managed Care – PPO | Source: Home / Self Care | Attending: Emergency Medicine | Admitting: Emergency Medicine

## 2019-10-31 ENCOUNTER — Other Ambulatory Visit: Payer: Self-pay

## 2019-10-31 ENCOUNTER — Emergency Department (HOSPITAL_COMMUNITY): Payer: BC Managed Care – PPO

## 2019-10-31 DIAGNOSIS — K5732 Diverticulitis of large intestine without perforation or abscess without bleeding: Secondary | ICD-10-CM | POA: Insufficient documentation

## 2019-10-31 DIAGNOSIS — I10 Essential (primary) hypertension: Secondary | ICD-10-CM | POA: Insufficient documentation

## 2019-10-31 DIAGNOSIS — Z91010 Allergy to peanuts: Secondary | ICD-10-CM | POA: Insufficient documentation

## 2019-10-31 DIAGNOSIS — R059 Cough, unspecified: Secondary | ICD-10-CM | POA: Insufficient documentation

## 2019-10-31 DIAGNOSIS — D72829 Elevated white blood cell count, unspecified: Secondary | ICD-10-CM | POA: Insufficient documentation

## 2019-10-31 DIAGNOSIS — R509 Fever, unspecified: Secondary | ICD-10-CM | POA: Insufficient documentation

## 2019-10-31 DIAGNOSIS — K5792 Diverticulitis of intestine, part unspecified, without perforation or abscess without bleeding: Secondary | ICD-10-CM

## 2019-10-31 DIAGNOSIS — F17210 Nicotine dependence, cigarettes, uncomplicated: Secondary | ICD-10-CM | POA: Insufficient documentation

## 2019-10-31 DIAGNOSIS — M7918 Myalgia, other site: Secondary | ICD-10-CM | POA: Insufficient documentation

## 2019-10-31 LAB — URINALYSIS, ROUTINE W REFLEX MICROSCOPIC
Bacteria, UA: NONE SEEN
Bilirubin Urine: NEGATIVE
Bilirubin Urine: NEGATIVE
Glucose, UA: NEGATIVE mg/dL
Glucose, UA: NEGATIVE mg/dL
Hgb urine dipstick: NEGATIVE
Hgb urine dipstick: NEGATIVE
Ketones, ur: NEGATIVE mg/dL
Ketones, ur: NEGATIVE mg/dL
Nitrite: NEGATIVE
Nitrite: NEGATIVE
Protein, ur: NEGATIVE mg/dL
Protein, ur: NEGATIVE mg/dL
Specific Gravity, Urine: 1.025 (ref 1.005–1.030)
Specific Gravity, Urine: 1.018 (ref 1.005–1.030)
pH: 5 (ref 5.0–8.0)
pH: 5 (ref 5.0–8.0)

## 2019-10-31 LAB — CBC WITH DIFFERENTIAL/PLATELET
Abs Immature Granulocytes: 0.11 10*3/uL — ABNORMAL HIGH (ref 0.00–0.07)
Basophils Absolute: 0.2 10*3/uL — ABNORMAL HIGH (ref 0.0–0.1)
Basophils Relative: 1 %
Eosinophils Absolute: 0.3 10*3/uL (ref 0.0–0.5)
Eosinophils Relative: 2 %
HCT: 38.8 % — ABNORMAL LOW (ref 39.0–52.0)
Hemoglobin: 12.3 g/dL — ABNORMAL LOW (ref 13.0–17.0)
Immature Granulocytes: 1 %
Lymphocytes Relative: 24 %
Lymphs Abs: 3.7 10*3/uL (ref 0.7–4.0)
MCH: 29.9 pg (ref 26.0–34.0)
MCHC: 31.7 g/dL (ref 30.0–36.0)
MCV: 94.2 fL (ref 80.0–100.0)
Monocytes Absolute: 1.1 10*3/uL — ABNORMAL HIGH (ref 0.1–1.0)
Monocytes Relative: 7 %
Neutro Abs: 10.4 10*3/uL — ABNORMAL HIGH (ref 1.7–7.7)
Neutrophils Relative %: 65 %
Platelets: 363 10*3/uL (ref 150–400)
RBC: 4.12 MIL/uL — ABNORMAL LOW (ref 4.22–5.81)
RDW: 13.2 % (ref 11.5–15.5)
WBC: 15.8 10*3/uL — ABNORMAL HIGH (ref 4.0–10.5)
nRBC: 0 % (ref 0.0–0.2)

## 2019-10-31 LAB — COMPREHENSIVE METABOLIC PANEL
ALT: 67 U/L — ABNORMAL HIGH (ref 0–44)
AST: 22 U/L (ref 15–41)
Albumin: 3.7 g/dL (ref 3.5–5.0)
Alkaline Phosphatase: 108 U/L (ref 38–126)
Anion gap: 10 (ref 5–15)
BUN: 10 mg/dL (ref 6–20)
CO2: 25 mmol/L (ref 22–32)
Calcium: 9.4 mg/dL (ref 8.9–10.3)
Chloride: 105 mmol/L (ref 98–111)
Creatinine, Ser: 0.77 mg/dL (ref 0.61–1.24)
GFR, Estimated: >60 mL/min (ref >60)
Glucose, Bld: 125 mg/dL — ABNORMAL HIGH (ref 70–99)
Potassium: 3.8 mmol/L (ref 3.5–5.1)
Sodium: 140 mmol/L (ref 135–145)
Total Bilirubin: 0.5 mg/dL (ref 0.3–1.2)
Total Protein: 7 g/dL (ref 6.5–8.1)

## 2019-10-31 LAB — LIPASE, BLOOD: Lipase: 24 U/L (ref 11–51)

## 2019-10-31 LAB — POC OCCULT BLOOD, ED: Fecal Occult Bld: NEGATIVE

## 2019-10-31 MED ORDER — IOHEXOL 300 MG/ML  SOLN
100.0000 mL | Freq: Once | INTRAMUSCULAR | Status: AC | PRN
  Administered 2019-10-31: 100 mL via INTRAVENOUS

## 2019-10-31 MED ORDER — SODIUM CHLORIDE (PF) 0.9 % IJ SOLN
INTRAMUSCULAR | Status: AC
  Filled 2019-10-31: qty 50

## 2019-10-31 MED ORDER — MORPHINE SULFATE (PF) 4 MG/ML IV SOLN
4.0000 mg | Freq: Once | INTRAVENOUS | Status: AC
  Administered 2019-10-31: 4 mg via INTRAVENOUS
  Filled 2019-10-31: qty 1

## 2019-10-31 MED ORDER — ONDANSETRON HCL 4 MG/2ML IJ SOLN
4.0000 mg | Freq: Once | INTRAMUSCULAR | Status: AC
  Administered 2019-10-31: 4 mg via INTRAVENOUS
  Filled 2019-10-31: qty 2

## 2019-10-31 NOTE — ED Provider Notes (Signed)
[facility] DEPT Provider Note   CSN: [id number] Arrival date & time: 10/31/19  0744     History Chief Complaint  Patient presents with  . Abdominal Pain    Henry Schmitt is a 59 y.o. male.  The history is provided by the patient. No language interpreter was used.  Abdominal Pain    59 year old male significant history of hypertension, prior cholecystectomy, presenting for evaluation abdominal pain.  Patient report he received his flu shot approximately 5 days ago.  He developed symptoms of fever chills body aches cough after the flu shot which lasted for the past several days.  He also has had his flu vaccination in the past, he had a negative Covid test 2 days ago.  He has been complaining of pain along his lower abdomen.  Pain is sharp achy 6 out of 10 with occasionally red stool.  Pain is worse with exertion.  He was  evaluated by a nurse petitioner at his job for his symptoms.  He was prescribed 2 different antibiotics for potential diverticular disease in which he has been taking it for the past 2 days but have not noticed any improvement.  He is here requesting for further evaluation.  He has not had colonoscopy.  He denies any dysuria.  Past Medical History:  Diagnosis Date  . Hypertension     Patient Active Problem List   Diagnosis Date Noted  . Influenza A 02/03/2016  . Hypokalemia 02/03/2016  . Bronchitis 02/03/2016  . Anemia 02/03/2016  . 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 Abdominal pain 04/18/2015  . Essential hypertension, benign 04/18/2015  . Leukocytosis 05/07/2014  . Hypogonadotropic hypogonadism ([place]) 04/06/2014  . Gynecomastia, male 03/18/2014    Past Surgical History:  Procedure Laterality Date  . CHOLECYSTECTOMY         Family History  Problem Relation Age of Onset  . Heart failure Mother   . Hypertension Mother   . Diabetes Other     Social History   Tobacco Use  . Smoking status: Current Every Day Smoker     Packs/day: 0.50    Types: Cigarettes  . Smokeless tobacco: Never Used  Vaping Use  . Vaping Use: Never used  Substance Use Topics  . Alcohol use: No  . Drug use: No    Home Medications Prior to Admission medications   Medication Sig Start Date End Date Taking? Authorizing Provider  albuterol (PROVENTIL HFA;VENTOLIN HFA) 108 (90 Base) MCG/ACT inhaler Inhale 2 puffs into the lungs every 4 (four) hours as needed for wheezing or shortness of breath. Patient not taking: Reported on 09/17/2018 12/27/17   Raylene Everts, MD  benzonatate (TESSALON) 200 MG capsule Take 1 capsule (200 mg total) by mouth 2 (two) times daily as needed for cough. Patient not taking: Reported on 09/17/2018 12/27/17   Raylene Everts, MD  methylPREDNISolone (MEDROL DOSEPAK) 4 MG TBPK tablet Use as directed 09/17/18   Margarita Mail, PA-C    Allergies    Antihistamines, chlorpheniramine-type; Lisinopril; and Peanut-containing drug products  Review of Systems   Review of Systems  Gastrointestinal: Positive for abdominal pain.  All other systems reviewed and are negative.   Physical Exam Updated Vital Signs BP (!) 147/89 (BP Location: Right Arm)   Pulse 60   Temp (!) 97.5 F (36.4 C) (Oral)   Resp 16   Ht 5' 6.5" (1.689 m)   Wt 85.3 kg   SpO2 100%   BMI 29.89 kg/m   Physical Exam  Vitals and nursing note reviewed.  Constitutional:      General: He is not in acute distress.    Appearance: He is well-developed.  HENT:     Head: Atraumatic.  Eyes:     Conjunctiva/sclera: Conjunctivae normal.  Cardiovascular:     Rate and Rhythm: Normal rate and regular rhythm.  Pulmonary:     Effort: Pulmonary effort is normal.     Breath sounds: Normal breath sounds.  Abdominal:     General: Abdomen is flat. Bowel sounds are normal.     Palpations: Abdomen is soft.     Tenderness: There is abdominal tenderness in the suprapubic area and left lower quadrant.     Hernia: No hernia is present.  Genitourinary:     CommentsRolla Plate, RN available as chaperone.  Normal rectal tone, no obvious mass, no stool impaction, no significant stool on glove. Musculoskeletal:     Cervical back: Neck supple.  Skin:    Findings: No rash.  Neurological:     Mental Status: He is alert.     ED Results / Procedures / Treatments   Labs (all labs ordered are listed, but only abnormal results are displayed) Labs Reviewed  CBC WITH DIFFERENTIAL/PLATELET - Abnormal; Notable for the following components:      Result Value   WBC 15.8 (*)    RBC 4.12 (*)    Hemoglobin 12.3 (*)    HCT 38.8 (*)    Neutro Abs 10.4 (*)    Monocytes Absolute 1.1 (*)    Basophils Absolute 0.2 (*)    Abs Immature Granulocytes 0.11 (*)    All other components within normal limits  COMPREHENSIVE METABOLIC PANEL - Abnormal; Notable for the following components:   Glucose, Bld 125 (*)    ALT 67 (*)    All other components within normal limits  URINALYSIS, ROUTINE W REFLEX MICROSCOPIC - Abnormal; Notable for the following components:   Leukocytes,Ua SMALL (*)    Bacteria, UA RARE (*)    All other components within normal limits  LIPASE, BLOOD  POC OCCULT BLOOD, ED    EKG None  Radiology CT ABDOMEN PELVIS W CONTRAST  Result Date: 10/31/2019 CLINICAL DATA:  Left lower quadrant pain pain and nausea for 5 days. Diverticulitis suspected EXAM: CT ABDOMEN AND PELVIS WITH CONTRAST TECHNIQUE: Multidetector CT imaging of the abdomen and pelvis was performed using the standard protocol following bolus administration of intravenous contrast. CONTRAST:  147mL OMNIPAQUE IOHEXOL 300 MG/ML  SOLN COMPARISON:  02/03/2016 FINDINGS: Lower chest:  No contributory findings. Hepatobiliary: No focal liver abnormality.Cholecystectomy. No bile duct dilatation. Pancreas: Unremarkable. Spleen: Unremarkable. Adrenals/Urinary Tract: Negative adrenals. No hydronephrosis or stone. Simple left upper pole renal cyst. Unremarkable bladder. Stomach/Bowel: Mild fat  inflammation along 1 of multiple descending colonic diverticula. No pneumoperitoneum or abscess. Vascular/Lymphatic: No acute vascular abnormality. Extensive atheromatous plaque of the aorta and iliacs. No mass or adenopathy. Reproductive:Reservoir prosthesis seen at the upper right scrotum. Other: No ascites or pneumoperitoneum. Musculoskeletal: No acute abnormalities. IMPRESSION: 1. Uncomplicated descending colonic diverticulitis. 2.  Aortic Atherosclerosis (ICD10-I70.0). Electronically Signed   By: Monte Fantasia M.D.   On: 10/31/2019 11:02    Procedures Procedures (including critical care time)  Medications Ordered in ED Medications  sodium chloride (PF) 0.9 % injection (has no administration in time range)  morphine 4 MG/ML injection 4 mg (4 mg Intravenous Given 10/31/19 0941)  ondansetron (ZOFRAN) injection 4 mg (4 mg Intravenous Given 10/31/19 0940)  iohexol (OMNIPAQUE) 300 MG/ML solution  100 mL (100 mLs Intravenous Contrast Given 10/31/19 1039)    ED Course  I have reviewed the triage vital signs and the nursing notes.  Pertinent labs & imaging results that were available during my care of the patient were reviewed by me and considered in my medical decision making (see chart for details).    MDM Rules/Calculators/A&P                          BP (!) 153/79 (BP Location: Right Arm)   Pulse 65   Temp 97.9 F (36.6 C) (Oral)   Resp 12   Ht 5' 6.5" (1.689 m)   Wt 85.3 kg   SpO2 97%   BMI 29.89 kg/m   Final Clinical Impression(s) / ED Diagnoses Final diagnoses:  Diverticulitis    Rx / DC Orders ED Discharge Orders    None     9:31 AM Patient here with lower abdominal pain for the past 5 days.  Has been on antibiotic to treat for presumptive diverticular disease for the past 2 days without any significant improvement.  Patient does have an tenderness to his lower abdomen without peritoneal sign.  Anticipate abdominal pelvis CT scan for evaluation.  10:10  AM Leukocytosis with a WBC of 15.8, trending down from yesterday.  Labs otherwise reassuring.  In the setting of having persistent abdominal pain, will obtain abdominal pelvic CT scan to assess for potential diverticular perforation or abscess.  12:06 PM Abdominal pelvis CT scan obtained demonstrate uncomplicated descending colonic diverticulitis without any evidence of perforation or abscess.  At this time symptom has improved and patient felt comfortable going home.  He will continue with current antibiotics as it will likely improve his symptoms however he understands to return if symptoms persist or worsen.   Domenic Moras, PA-C 10/31/19 1211    Dorie Rank, MD 11/03/19 [phone]

## 2019-10-31 NOTE — Discharge Instructions (Addendum)
Your abdominal pain is related to diverticulitis.  Please continue taking antibiotic as prescribed as this should improve your symptoms.  You may alternate between Tylenol and ibuprofen at home as needed.  Return if you notice worsening pain fever or if you have any concern.

## 2019-10-31 NOTE — ED Notes (Addendum)
Patient transported to CT 

## 2019-10-31 NOTE — ED Triage Notes (Signed)
Patient  c/o LLQ abdominal pain and nausea x 5 days.

## 2020-08-15 ENCOUNTER — Emergency Department (HOSPITAL_COMMUNITY): Payer: No Typology Code available for payment source

## 2020-08-15 ENCOUNTER — Encounter (HOSPITAL_COMMUNITY): Payer: Self-pay | Admitting: Emergency Medicine

## 2020-08-15 ENCOUNTER — Emergency Department (HOSPITAL_COMMUNITY)
Admission: EM | Admit: 2020-08-15 | Discharge: 2020-08-15 | Disposition: A | Discharge status: Home / Self Care | Payer: No Typology Code available for payment source | Attending: Emergency Medicine | Admitting: Emergency Medicine

## 2020-08-15 ENCOUNTER — Other Ambulatory Visit: Payer: Self-pay

## 2020-08-15 DIAGNOSIS — Z91010 Allergy to peanuts: Secondary | ICD-10-CM | POA: Diagnosis not present

## 2020-08-15 DIAGNOSIS — F17210 Nicotine dependence, cigarettes, uncomplicated: Secondary | ICD-10-CM | POA: Diagnosis not present

## 2020-08-15 DIAGNOSIS — R509 Fever, unspecified: Secondary | ICD-10-CM | POA: Diagnosis present

## 2020-08-15 DIAGNOSIS — R000 Tachycardia, unspecified: Secondary | ICD-10-CM | POA: Insufficient documentation

## 2020-08-15 DIAGNOSIS — U071 COVID-19: Secondary | ICD-10-CM | POA: Diagnosis not present

## 2020-08-15 DIAGNOSIS — I10 Essential (primary) hypertension: Secondary | ICD-10-CM | POA: Diagnosis not present

## 2020-08-15 LAB — RESP PANEL BY RT-PCR (FLU A&B, COVID) ARPGX2
Influenza A by PCR: NEGATIVE
Influenza B by PCR: NEGATIVE
SARS Coronavirus 2 by RT PCR: POSITIVE — AB

## 2020-08-15 LAB — BASIC METABOLIC PANEL
Anion gap: 7 (ref 5–15)
BUN: 12 mg/dL (ref 6–20)
CO2: 23 mmol/L (ref 22–32)
Calcium: 8.9 mg/dL (ref 8.9–10.3)
Chloride: 103 mmol/L (ref 98–111)
Creatinine, Ser: 0.88 mg/dL (ref 0.61–1.24)
GFR, Estimated: >60 mL/min (ref >60)
Glucose, Bld: 122 mg/dL — ABNORMAL HIGH (ref 70–99)
Potassium: 3.4 mmol/L — ABNORMAL LOW (ref 3.5–5.1)
Sodium: 133 mmol/L — ABNORMAL LOW (ref 135–145)

## 2020-08-15 LAB — CBC
HCT: 39.6 % (ref 39.0–52.0)
Hemoglobin: 12.6 g/dL — ABNORMAL LOW (ref 13.0–17.0)
MCH: 29.6 pg (ref 26.0–34.0)
MCHC: 31.8 g/dL (ref 30.0–36.0)
MCV: 93.2 fL (ref 80.0–100.0)
Platelets: 304 10*3/uL (ref 150–400)
RBC: 4.25 MIL/uL (ref 4.22–5.81)
RDW: 13.6 % (ref 11.5–15.5)
WBC: 11.7 10*3/uL — ABNORMAL HIGH (ref 4.0–10.5)
nRBC: 0 % (ref 0.0–0.2)

## 2020-08-15 MED ORDER — SODIUM CHLORIDE 0.9 % IV BOLUS
1000.0000 mL | Freq: Once | INTRAVENOUS | Status: AC
  Administered 2020-08-15: 1000 mL via INTRAVENOUS

## 2020-08-15 MED ORDER — NIRMATRELVIR/RITONAVIR (PAXLOVID)TABLET: 3.0000 | ORAL_TABLET | Freq: Two times a day (BID) | ORAL | 0 refills | Status: AC

## 2020-08-15 MED ORDER — ACETAMINOPHEN 325 MG PO TABS
650.0000 mg | ORAL_TABLET | Freq: Once | ORAL | Status: AC
  Administered 2020-08-15: 650 mg via ORAL
  Filled 2020-08-15: qty 2

## 2020-08-15 NOTE — ED Triage Notes (Addendum)
Pt c/o fever, chills, htn, headache, body aches that began last night and worsened today. Pt tachycardic, tachypneic, and febrile in triage. Hx of htn. Pt is vaccinated and boosted. Pt reports recent travel to [place].

## 2020-08-15 NOTE — Discharge Instructions (Addendum)
Your COVID test came back positive.  You will need to quarantine through Thursday and then 5 more days of masking.

## 2020-08-15 NOTE — ED Notes (Signed)
Pt ambulatory in ED lobby. 

## 2020-08-15 NOTE — ED Provider Notes (Signed)
[facility] DEPT Provider Note   CSN: [id number] Arrival date & time: 08/15/20  1938     History Chief Complaint  Patient presents with   Fever    Henry Schmitt is a 60 y.o. male.   Fever Associated symptoms: chills and cough   Associated symptoms: no chest pain, no confusion and no rash   Patient presents with fevers chills headaches sore throat.  Also myalgias.  Began last night has been worse today.  Patient is vaccinated and boosted for COVID but has not previously had COVID.  Yesterday was coming home on a plane after being on a cruise in [place].  No nausea or vomiting.  No definite sick contacts.  States he has previously had the flu and got very sick with it where they are worried that he was going to die.    Past Medical History:  Diagnosis Date   Hypertension     Patient Active Problem List   Diagnosis Date Noted   Influenza A 02/03/2016   Hypokalemia 02/03/2016   Bronchitis 02/03/2016   Anemia 02/03/2016   Aortitis ([facility]) 04/18/2015   Abdominal pain 04/18/2015   Essential hypertension, benign 04/18/2015   Leukocytosis 05/07/2014   Hypogonadotropic hypogonadism ([facility]) 04/06/2014   Gynecomastia, male 03/18/2014    Past Surgical History:  Procedure Laterality Date   CHOLECYSTECTOMY         Family History  Problem Relation Age of Onset   Heart failure Mother    Hypertension Mother    Diabetes Other     Social History   Tobacco Use   Smoking status: Every Day    Packs/day: 0.50    Types: Cigarettes   Smokeless tobacco: Never  Vaping Use   Vaping Use: Never used  Substance Use Topics   Alcohol use: No   Drug use: No    Home Medications Prior to Admission medications   Medication Sig Start Date End Date Taking? Authorizing Provider  nirmatrelvir/ritonavir EUA (PAXLOVID) TABS Take 3 tablets by mouth 2 (two) times daily for 5 days. Patient GFR is >60. Take nirmatrelvir (150 mg) two tablets twice daily for 5 days and  ritonavir (100 mg) one tablet twice daily for 5 days. 08/15/20 08/20/20 Yes Davonna Belling, MD  ciprofloxacin (CIPRO) 500 MG tablet Take 500 mg by mouth 2 (two) times daily.    [provider]  metroNIDAZOLE (FLAGYL) 500 MG tablet Take 500 mg by mouth 3 (three) times daily.    [provider]  albuterol (PROVENTIL HFA;VENTOLIN HFA) 108 (90 Base) MCG/ACT inhaler Inhale 2 puffs into the lungs every 4 (four) hours as needed for wheezing or shortness of breath. Patient not taking: Reported on 09/17/2018 12/27/17 10/31/19  Raylene Everts, MD    Allergies    Antihistamines, chlorpheniramine-type; Lisinopril; and Peanut-containing drug products  Review of Systems   Review of Systems  Constitutional:  Positive for chills and fever.  Respiratory:  Positive for cough. Negative for shortness of breath.   Cardiovascular:  Negative for chest pain.  Gastrointestinal:  Negative for abdominal pain.  Genitourinary:  Negative for flank pain.  Musculoskeletal:  Negative for back pain.  Skin:  Negative for rash.  Psychiatric/Behavioral:  Negative for confusion.    Physical Exam Updated Vital Signs BP (!) 170/78   Pulse (!) 103   Temp (!) 101.5 F (38.6 C) (Oral)   Resp 18   Ht 5' 6.5" (1.689 m)   Wt 85.3 kg   SpO2 97%  BMI 29.89 kg/m   Physical Exam Vitals and nursing note reviewed.  HENT:     Head: Atraumatic.     Mouth/Throat:     Pharynx: Posterior oropharyngeal erythema present.  Eyes:     Pupils: Pupils are equal, round, and reactive to light.  Cardiovascular:     Rate and Rhythm: Tachycardia present.  Pulmonary:     Breath sounds: No wheezing or rhonchi.  Abdominal:     Tenderness: There is no abdominal tenderness.  Musculoskeletal:        General: No tenderness.     Cervical back: Neck supple.  Skin:    General: Skin is warm.     Capillary Refill: Capillary refill takes less than 2 seconds.  Neurological:     Mental Status: He is alert and oriented to  person, place, and time.    ED Results / Procedures / Treatments   Labs (all labs ordered are listed, but only abnormal results are displayed) Labs Reviewed  RESP PANEL BY RT-PCR (FLU A&B, COVID) ARPGX2 - Abnormal; Notable for the following components:      Result Value   SARS Coronavirus 2 by RT PCR POSITIVE (*)    All other components within normal limits  BASIC METABOLIC PANEL - Abnormal; Notable for the following components:   Sodium 133 (*)    Potassium 3.4 (*)    Glucose, Bld 122 (*)    All other components within normal limits  CBC - Abnormal; Notable for the following components:   WBC 11.7 (*)    Hemoglobin 12.6 (*)    All other components within normal limits    EKG None  Radiology DG Chest Portable 1 View  Result Date: 08/15/2020 CLINICAL DATA:  Cough and fever.  Headache. EXAM: PORTABLE CHEST 1 VIEW COMPARISON:  09/17/2018 FINDINGS: Heart size is normal. Lungs are free of focal consolidations and pleural effusions. No pulmonary edema. IMPRESSION: No active disease. Electronically Signed   By: Nolon Nations M.D.   On: 08/15/2020 20:35    Procedures Procedures   Medications Ordered in ED Medications  sodium chloride 0.9 % bolus 1,000 mL (0 mLs Intravenous Stopped 08/15/20 2059)  acetaminophen (TYLENOL) tablet 650 mg (650 mg Oral Given 08/15/20 2025)    ED Course  I have reviewed the triage vital signs and the nursing notes.  Pertinent labs & imaging results that were available during my care of the patient were reviewed by me and considered in my medical decision making (see chart for details).    MDM Rules/Calculators/A&P                           Patient presents with fevers chills myalgias.  Just got off a cruise.  Little tachycardic.  That is improved.  Found to be febrile.  Chest x-ray reassuring.  Lab work reassuring but found to COVID-positive.  Appears to be candidate for Paxil bid.  GFR greater than 60.  Will discharge home. Final Clinical  Impression(s) / ED Diagnoses Final diagnoses:  [id number]    Rx / DC Orders ED Discharge Orders          Ordered    nirmatrelvir/ritonavir EUA (PAXLOVID) TABS  2 times daily        08/15/20 2120             Davonna Belling, MD 08/15/20 2122

## 2020-10-14 ENCOUNTER — Institutional Professional Consult (permissible substitution): Payer: No Typology Code available for payment source | Admitting: Student

## 2021-01-20 ENCOUNTER — Other Ambulatory Visit: Payer: Self-pay

## 2021-01-20 ENCOUNTER — Ambulatory Visit (INDEPENDENT_AMBULATORY_CARE_PROVIDER_SITE_OTHER): Payer: 59

## 2021-01-20 ENCOUNTER — Encounter: Payer: Self-pay | Admitting: Podiatry

## 2021-01-20 ENCOUNTER — Ambulatory Visit (INDEPENDENT_AMBULATORY_CARE_PROVIDER_SITE_OTHER): Payer: 59 | Admitting: Podiatry

## 2021-01-20 DIAGNOSIS — D2371 Other benign neoplasm of skin of right lower limb, including hip: Secondary | ICD-10-CM | POA: Diagnosis not present

## 2021-01-20 DIAGNOSIS — M7751 Other enthesopathy of right foot: Secondary | ICD-10-CM | POA: Diagnosis not present

## 2021-01-20 DIAGNOSIS — D2372 Other benign neoplasm of skin of left lower limb, including hip: Secondary | ICD-10-CM | POA: Diagnosis not present

## 2021-01-20 DIAGNOSIS — M7752 Other enthesopathy of left foot: Secondary | ICD-10-CM

## 2021-01-20 MED ORDER — DEXAMETHASONE SODIUM PHOSPHATE 120 MG/30ML IJ SOLN
4.0000 mg | Freq: Once | INTRAMUSCULAR | Status: AC
  Administered 2021-01-20: 4 mg via INTRA_ARTICULAR

## 2021-01-20 MED ORDER — MELOXICAM 15 MG PO TABS: 15.0000 mg | ORAL_TABLET | Freq: Every day | ORAL | 3 refills | Status: DC

## 2021-01-20 NOTE — Progress Notes (Signed)
Subjective:  Patient ID: Henry Schmitt, male    DOB: [birth year],  MRN: [id number] HPI Chief Complaint  Patient presents with   Foot Pain    Sub 5th MPJ bilateral - callused areas x years, stands 10 hours/day, tingling plantar midfoot, used to trim but PCP said to stop   New Patient (Initial Visit)    61 y.o. male presents with the above complaint.   ROS: Denies fever chills nausea vomiting muscle aches pains calf pain back pain chest pain shortness of breath.  Past Medical History:  Diagnosis Date   Hypertension    Past Surgical History:  Procedure Laterality Date   CHOLECYSTECTOMY      Current Outpatient Medications:    meloxicam (MOBIC) 15 MG tablet, Take 1 tablet (15 mg total) by mouth daily., Disp: 30 tablet, Rfl: 3   losartan (COZAAR) 50 MG tablet, Take 50 mg by mouth daily., Disp: , Rfl:    rosuvastatin (CRESTOR) 10 MG tablet, Take 10 mg by mouth daily., Disp: , Rfl:    traZODone (DESYREL) 50 MG tablet, Take 50 mg by mouth at bedtime as needed., Disp: , Rfl:   Allergies  Allergen Reactions   Antihistamines, Chlorpheniramine-Type Hives and Swelling   Lisinopril Swelling   Peanut-Containing Drug Products Swelling    ALL TREE NUTS   Review of Systems Objective:  There were no vitals filed for this visit.  General: Well developed, nourished, in no acute distress, alert and oriented x3   Dermatological: Skin is warm, dry and supple bilateral. Nails x 10 are well maintained; remaining integument appears unremarkable at this time. There are no open sores, no preulcerative lesions, no rash or signs of infection present.  Vascular: Dorsalis Pedis artery and Posterior Tibial artery pedal pulses are 2/4 bilateral with immedate capillary fill time. Pedal hair growth present. No varicosities and no lower extremity edema present bilateral.   Neruologic: Grossly intact via light touch bilateral. Vibratory intact via tuning fork bilateral. Protective threshold with Semmes  Wienstein monofilament intact to all pedal sites bilateral. Patellar and Achilles deep tendon reflexes 2+ bilateral. No Babinski or clonus noted bilateral.   Musculoskeletal: No gross boney pedal deformities bilateral. No pain, crepitus, or limitation noted with foot and ankle range of motion bilateral. Muscular strength 5/5 in all groups tested bilateral.  Limited range of motion dorsiflexion right foot with pain.  Has better range of motion of the first metatarsophalangeal joint of the left foot.  He has pain on frontal plane range of motion of the right foot less degree of the left foot.  Palpable mass of osteoarthritic spurs dorsally right foot and first metatarsal phalangeal joint.  He has a palpable bursa beneath the fifth metatarsals bilaterally with an overlying reactive hyperkeratosis.  Gait: Unassisted, Nonantalgic.    Radiographs:  Radiographs taken today demonstrate severe osteoarthritis of the midfoot right foot some on the left foot as well he also has severe osteoarthritis of the first metatarsophalangeal joint of the right foot as opposed to the left.  No other significant acute findings are noted.  Assessment & Plan:   Assessment: Some fifth bursitis bilateral.  Capsulitis fifth metatarsophalangeal joint bilateral osteoarthritis midfoot right and first metatarsophalangeal joint right lesser degree of the left foot.  Painful benign skin lesion subfifth metatarsal head bilateral.  Plan: Started him on meloxicam 15 mg 1 p.o. daily I also injected some leisurely today into the bursa 2 mg with 5 mg of local anesthetic to the point maximal tenderness.  I debrided  all reactive hyperkeratotic tissue follow-up with him in about 1 month.     Ercelle Winkles T. Thornton, [place]

## 2021-03-03 ENCOUNTER — Ambulatory Visit: Payer: 59

## 2021-03-03 ENCOUNTER — Other Ambulatory Visit: Payer: Self-pay

## 2021-03-03 ENCOUNTER — Telehealth: Payer: Self-pay

## 2021-03-03 ENCOUNTER — Ambulatory Visit: Payer: 59 | Admitting: Podiatry

## 2021-03-03 DIAGNOSIS — M7752 Other enthesopathy of left foot: Secondary | ICD-10-CM | POA: Diagnosis not present

## 2021-03-03 DIAGNOSIS — M7751 Other enthesopathy of right foot: Secondary | ICD-10-CM | POA: Diagnosis not present

## 2021-03-03 NOTE — Telephone Encounter (Signed)
Casts sent to central [facility]

## 2021-03-03 NOTE — Progress Notes (Signed)
SITUATION Reason for Consult: Evaluation for Bilateral Custom Foot Orthoses Patient / Caregiver Report: Patient is ready for foot orthotics  OBJECTIVE DATA: Patient History / Diagnosis:    ICD-10-CM   1. Bursitis of both feet  M77.51    M77.52       Current or Previous Devices: None and no history  Foot Examination: Skin presentation:   Intact Ulcers & Callousing:   None and no history Toe / Foot Deformities:  Bursitis Weight Bearing Presentation:  Rectus Sensation:    Intact  Shoe size 7.62M  ORTHOTIC RECOMMENDATION Recommended Device: 1x pair of custom functional foot orthotics  GOALS OF ORTHOSES - Reduce Pain - Prevent Foot Deformity - Prevent Progression of Further Foot Deformity - Relieve Pressure - Improve the Overall Biomechanical Function of the Foot and Lower Extremity.  ACTIONS PERFORMED Patient was casted for Foot Orthoses via crush box. Procedure was explained and patient tolerated procedure well. All questions were answered and concerns addressed.  PLAN Potential out of pocket cost was communicated to patient. Casts are to be sent to [facility] for fabrication. Patient is to be called for fitting when devices are ready.

## 2021-03-05 NOTE — Progress Notes (Signed)
Henry Schmitt presents today for follow-up of the subfifth metatarsal joint bursitis and osteoarthritis of the midfoot and first metatarsal area.  States that the joint on the right is still less painful than the wound the other side.  Objective: Vital signs are stable alert and oriented x3 I trimmed the callus beneath the fifth metatarsal head bilaterally.  No open lesions or wounds are noted.  Much decrease in the bursitis.  Assessment: Pain benign skin lesions of fifth met head bilateral.  Plan: Send her to [facility] for orthotics for a cut out beneath the fifth metatarsal heads bilateral.

## 2021-04-05 ENCOUNTER — Ambulatory Visit: Payer: 59 | Admitting: Podiatry

## 2021-04-11 ENCOUNTER — Telehealth: Payer: Self-pay | Admitting: Podiatry

## 2021-04-11 NOTE — Telephone Encounter (Signed)
Left voicemail [facility] ?

## 2021-04-12 ENCOUNTER — Ambulatory Visit (INDEPENDENT_AMBULATORY_CARE_PROVIDER_SITE_OTHER): Payer: 59

## 2021-04-12 DIAGNOSIS — M7751 Other enthesopathy of right foot: Secondary | ICD-10-CM | POA: Diagnosis not present

## 2021-04-12 DIAGNOSIS — M7752 Other enthesopathy of left foot: Secondary | ICD-10-CM

## 2021-04-12 NOTE — Progress Notes (Signed)
SITUATION: ?Reason for Visit: Fitting and Delivery of Custom Fabricated Foot Orthoses ?Patient Report: Patient reports comfort and is satisfied with device. ? ?OBJECTIVE DATA: ?Patient History / Diagnosis:   ?  ICD-10-CM   ?1. Bursitis of both feet  M77.51   ? M77.52   ?  ? ? ?Provided Device:  Custom Functional Foot Orthotics ? ?GOAL OF ORTHOSIS ?- Improve gait ?- Decrease energy expenditure ?- Improve Balance ?- Provide Triplanar stability of foot complex ?- Facilitate motion ? ?ACTIONS PERFORMED ?Patient was fit with foot orthotics trimmed to shoe last. Patient tolerated fittign procedure.  ? ?Patient was provided with verbal and written instruction and demonstration regarding donning, doffing, wear, care, proper fit, function, purpose, cleaning, and use of the orthosis and in all related precautions and risks and benefits regarding the orthosis. ? ?Patient was also provided with verbal instruction regarding how to report any failures or malfunctions of the orthosis and necessary follow up care. Patient was also instructed to contact our office regarding any change in status that may affect the function of the orthosis. ? ?Patient demonstrated independence with proper donning, doffing, and fit and verbalized understanding of all instructions. ? ?PLAN: ?Patient is to follow up in one week or as necessary (PRN). All questions were answered and concerns addressed. Plan of care was discussed with and agreed upon by the patient. ? ?

## 2022-04-25 ENCOUNTER — Ambulatory Visit (INDEPENDENT_AMBULATORY_CARE_PROVIDER_SITE_OTHER): Payer: Medicaid Other | Admitting: Podiatry

## 2022-04-25 DIAGNOSIS — D2372 Other benign neoplasm of skin of left lower limb, including hip: Secondary | ICD-10-CM | POA: Diagnosis not present

## 2022-04-25 DIAGNOSIS — M7751 Other enthesopathy of right foot: Secondary | ICD-10-CM

## 2022-04-25 DIAGNOSIS — M7752 Other enthesopathy of left foot: Secondary | ICD-10-CM

## 2022-04-25 DIAGNOSIS — D2371 Other benign neoplasm of skin of right lower limb, including hip: Secondary | ICD-10-CM | POA: Diagnosis not present

## 2022-04-25 MED ORDER — MELOXICAM 15 MG PO TABS: 15.0000 mg | ORAL_TABLET | Freq: Every day | ORAL | 3 refills | Status: DC

## 2022-04-25 MED ORDER — DEXAMETHASONE SODIUM PHOSPHATE 120 MG/30ML IJ SOLN
4.0000 mg | Freq: Once | INTRAMUSCULAR | Status: AC
  Administered 2022-04-25: 4 mg via INTRA_ARTICULAR

## 2022-04-25 NOTE — Progress Notes (Signed)
Presents today for follow-up of bursitis beneath the fifth metatarsals bilaterally.  Requesting injections and debridement of these lesions.  Objective: Vital signs stable alert and oriented x 3.  Pulses are palpable.  There is no erythema edema salines drainage or odor just prominent fifth metatarsal heads bilaterally that resulting in reactive hyperkeratosis to the plantar aspect of the fifth minutes.  These lesions are not open though they do demonstrate fluctuance beneath them.  Assessment: Is bursitis beneath the fifth metatarsal heads bilaterally.  Painful hyperkeratotic lesions.  Plan: Discussed etiology pathology and surgical therapies at this point debrided all of reactive hyperkeratotic tissue discussed orthotics injected the areas today with 4 mg of dexamethasone and local anesthetic.  Discussed fifth metatarsal head resections.

## 2022-05-09 ENCOUNTER — Ambulatory Visit: Payer: Medicaid Other | Admitting: Podiatry

## 2022-10-11 ENCOUNTER — Other Ambulatory Visit: Payer: Self-pay

## 2022-10-11 ENCOUNTER — Encounter (HOSPITAL_COMMUNITY): Payer: Self-pay | Admitting: Emergency Medicine

## 2022-10-11 ENCOUNTER — Emergency Department (HOSPITAL_COMMUNITY)
Admission: EM | Admit: 2022-10-11 | Discharge: 2022-10-12 | Disposition: A | Discharge status: Home / Self Care | Payer: Medicaid Other | Attending: Emergency Medicine | Admitting: Emergency Medicine

## 2022-10-11 ENCOUNTER — Ambulatory Visit (HOSPITAL_COMMUNITY)
Admission: EM | Admit: 2022-10-11 | Discharge: 2022-10-11 | Disposition: A | Discharge status: Home / Self Care | Payer: Medicaid Other

## 2022-10-11 ENCOUNTER — Emergency Department (HOSPITAL_COMMUNITY): Payer: Medicaid Other

## 2022-10-11 ENCOUNTER — Encounter (HOSPITAL_COMMUNITY): Payer: Self-pay

## 2022-10-11 DIAGNOSIS — I10 Essential (primary) hypertension: Secondary | ICD-10-CM | POA: Diagnosis not present

## 2022-10-11 DIAGNOSIS — R030 Elevated blood-pressure reading, without diagnosis of hypertension: Secondary | ICD-10-CM

## 2022-10-11 DIAGNOSIS — H539 Unspecified visual disturbance: Secondary | ICD-10-CM | POA: Diagnosis not present

## 2022-10-11 DIAGNOSIS — R519 Headache, unspecified: Secondary | ICD-10-CM | POA: Diagnosis present

## 2022-10-11 DIAGNOSIS — Z79899 Other long term (current) drug therapy: Secondary | ICD-10-CM | POA: Insufficient documentation

## 2022-10-11 LAB — TROPONIN I (HIGH SENSITIVITY)
Troponin I (High Sensitivity): 21 ng/L — ABNORMAL HIGH (ref <18)
Troponin I (High Sensitivity): 26 ng/L — ABNORMAL HIGH (ref <18)

## 2022-10-11 LAB — CBC
HCT: 43 % (ref 39.0–52.0)
Hemoglobin: 13.6 g/dL (ref 13.0–17.0)
MCH: 30.4 pg (ref 26.0–34.0)
MCHC: 31.6 g/dL (ref 30.0–36.0)
MCV: 96.2 fL (ref 80.0–100.0)
Platelets: 344 10*3/uL (ref 150–400)
RBC: 4.47 MIL/uL (ref 4.22–5.81)
RDW: 13.2 % (ref 11.5–15.5)
WBC: 15.1 10*3/uL — ABNORMAL HIGH (ref 4.0–10.5)
nRBC: 0 % (ref 0.0–0.2)

## 2022-10-11 LAB — BASIC METABOLIC PANEL
Anion gap: 12 (ref 5–15)
BUN: 18 mg/dL (ref 8–23)
CO2: 20 mmol/L — ABNORMAL LOW (ref 22–32)
Calcium: 9 mg/dL (ref 8.9–10.3)
Chloride: 104 mmol/L (ref 98–111)
Creatinine, Ser: 0.93 mg/dL (ref 0.61–1.24)
GFR, Estimated: >60 mL/min (ref >60)
Glucose, Bld: 106 mg/dL — ABNORMAL HIGH (ref 70–99)
Potassium: 3.8 mmol/L (ref 3.5–5.1)
Sodium: 136 mmol/L (ref 135–145)

## 2022-10-11 MED ORDER — DIPHENHYDRAMINE HCL 50 MG/ML IJ SOLN
25.0000 mg | Freq: Once | INTRAMUSCULAR | Status: AC
  Administered 2022-10-11: 25 mg via INTRAVENOUS
  Filled 2022-10-11: qty 1

## 2022-10-11 MED ORDER — KETOROLAC TROMETHAMINE 30 MG/ML IJ SOLN
15.0000 mg | Freq: Once | INTRAMUSCULAR | Status: AC
  Administered 2022-10-11: 15 mg via INTRAVENOUS
  Filled 2022-10-11: qty 1

## 2022-10-11 MED ORDER — SODIUM CHLORIDE 0.9 % IV BOLUS
500.0000 mL | Freq: Once | INTRAVENOUS | Status: AC
  Administered 2022-10-11: 500 mL via INTRAVENOUS

## 2022-10-11 MED ORDER — METOCLOPRAMIDE HCL 5 MG/ML IJ SOLN
10.0000 mg | Freq: Once | INTRAMUSCULAR | Status: AC
  Administered 2022-10-11: 10 mg via INTRAVENOUS
  Filled 2022-10-11: qty 2

## 2022-10-11 NOTE — ED Triage Notes (Signed)
Pt c/o HTN 180 sysolic when he took bp earlier before going to UC. Pt c/o HA, neck pain, blurred vision started today. Pt states the blurred vision has resolved.

## 2022-10-11 NOTE — Discharge Instructions (Addendum)
Please go to the nearest emergency department for further workup and evaluation given sudden onset of symptoms, vision changes, and headache.

## 2022-10-11 NOTE — ED Provider Triage Note (Signed)
Emergency Medicine Provider Triage Evaluation Note  Jermall Isaacson , a 62 y.o. male  was evaluated in triage.  Pt complains of headache, neck pain, blurry vision starting today around 1pm. Blurry vision has resolved. Also had some R sided facial "aching" yesterday. Checked BP at work and systolics in the 180s, then went to UC. Was sent to ER from UC for further evaluation.   Review of Systems  Positive: As above Negative: Weakness, numbness, CP, SOB, difficulty speaking or swallowing  Physical Exam  BP (!) 199/95   Pulse 80   Temp 98 F (36.7 C)   Resp 17   Ht 5' 6.5" (1.689 m)   Wt 85.3 kg   SpO2 99%   BMI 29.90 kg/m  Gen:   Awake, no distress   Resp:  Normal effort  MSK:   Moves extremities without difficulty  Other:  No facial droop, no slurred speech, ambulating normally  Medical Decision Making  Medically screening exam initiated at 5:58 PM.  Appropriate orders placed.  Dov Dill was informed that the remainder of the evaluation will be completed by another provider, this initial triage assessment does not replace that evaluation, and the importance of remaining in the ED until their evaluation is complete.  Workup initiated   Su Monks, PA-C 10/11/22 1758

## 2022-10-11 NOTE — ED Triage Notes (Addendum)
Reports noticing a headache with neck pain at work today. Checked BP at work and found it to be in the 180s systolic. Reports a continued headache and neck pain in triage with nausea, BP 166/108. Denies any sensitivities to light or sound, dizziness, fever, near syncope, vomiting, weakness, numbness, tingling. Stopped taking BP meds three years ago d/t having had it under control previously, PCP aware per patient

## 2022-10-11 NOTE — ED Provider Notes (Signed)
[facility] Emergency Department Provider Note MRN:  [id number]  Arrival date & time: 10/12/22     Chief Complaint   Hypertension   History of Present Illness   Henry Schmitt is a 62 y.o. year-old male presents to the ED with chief complaint of headache and elevated BP.  States that he was at work today and felt like he was getting a headache, which is unusual for him.  His boss suggested that he take his BP and noted it to be much higher than normal 180s systolic.  He states that his BP has been up all day.  He still reports having a headache.  He states that he isn't currently taking any BP meds because his pressure has been well controlled.  He denies chest pain, SOB, fever, chills, cough, sinus congestion or pressure or any other associated symptoms.  History provided by patient.   Review of Systems  Pertinent positive and negative review of systems noted in HPI.    Physical Exam   Vitals:   10/11/22 2300 10/11/22 2315  BP: (!) 182/78 (!) 170/83  Pulse: 66 64  Resp: 17 17  Temp:    SpO2: 98% 98%    CONSTITUTIONAL:  well-appearing, NAD NEURO:  Alert and oriented x 3, CN 3-12 grossly intact EYES:  eyes equal and reactive ENT/NECK:  Supple, no stridor  CARDIO:  normal rate, regular rhythm, appears well-perfused  PULM:  No respiratory distress, CTAB GI/GU:  non-distended,  MSK/SPINE:  No gross deformities, no edema, moves all extremities  SKIN:  no rash, atraumatic   *Additional and/or pertinent findings included in MDM below  Diagnostic and Interventional Summary    EKG Interpretation Date/Time:    Ventricular Rate:    PR Interval:    QRS Duration:    QT Interval:    QTC Calculation:   R Axis:      Text Interpretation:         Labs Reviewed  BASIC METABOLIC PANEL - Abnormal; Notable for the following components:      Result Value   CO2 20 (*)    Glucose, Bld 106 (*)    All other components within normal limits  CBC - Abnormal;  Notable for the following components:   WBC 15.1 (*)    All other components within normal limits  TROPONIN I (HIGH SENSITIVITY) - Abnormal; Notable for the following components:   Troponin I (High Sensitivity) 21 (*)    All other components within normal limits  TROPONIN I (HIGH SENSITIVITY) - Abnormal; Notable for the following components:   Troponin I (High Sensitivity) 26 (*)    All other components within normal limits    CT Head Wo Contrast  Final Result      Medications  ketorolac (TORADOL) 30 MG/ML injection 15 mg (15 mg Intravenous Given 10/11/22 2336)  metoCLOPramide (REGLAN) injection 10 mg (10 mg Intravenous Given 10/11/22 2332)  diphenhydrAMINE (BENADRYL) injection 25 mg (25 mg Intravenous Given 10/11/22 2328)  sodium chloride 0.9 % bolus 500 mL (500 mLs Intravenous New Bag/Given 10/11/22 2327)     Procedures  /  Critical Care Procedures  ED Course and Medical Decision Making  I have reviewed the triage vital signs, the nursing notes, and pertinent available records from the EMR.  Social Determinants Affecting Complexity of Care: Patient has no clinically significant social determinants affecting this chief complaint..   ED Course: Clinical Course as of 10/12/22 0040  Wed Oct 11, 2022  2217 Troponin  I (High Sensitivity)(!) 1st trop 21, repeat 24, not having any chest pain.  ACS thought less likely. [RB]  2217 Basic metabolic panel(!) No significant electrolyte abnormality [RB]  2217 CT Head Wo Contrast No obvious ICH [RB]  2218 BP(!): 199/95 Only having headache, which started before BP was noted to be high.  Will trial migraine cocktail and see if that helps with his BP since his BP has been well controlled recently. [RB]  Thu Oct 12, 2022  0038 Patient reassessed.  BP is 150/75.  Headache has resolved.  Patient comfortable with going home.  No chest pain or SOB.  Currently symptom free. [RB]    Clinical Course User Index [RB] Roxy Horseman, PA-C     Medical Decision Making Amount and/or Complexity of Data Reviewed Labs: ordered. Decision-making details documented in ED Course. Radiology: independent interpretation performed. Decision-making details documented in ED Course. ECG/medicine tests: independent interpretation performed.    Details: NSR, incomplete RBBB  Risk Prescription drug management.         Consultants: No consultations were needed in caring for this patient.   Treatment and Plan: I considered admission due to patient's initial presentation, but after considering the examination and diagnostic results, patient will not require admission and can be discharged with outpatient follow-up.    Final Clinical Impressions(s) / ED Diagnoses     ICD-10-CM   1. Nonintractable headache, unspecified chronicity pattern, unspecified headache type  R51.9     2. Elevated blood pressure reading  R03.0       ED Discharge Orders     None         Discharge Instructions Discussed with and Provided to Patient:     Discharge Instructions      Please follow-up with your doctor in the next 2 weeks.  Check your blood pressure morning and evening.  You doctor might need to start you back on blood pressure medications.       Roxy Horseman, PA-C 10/12/22 0040    Sloan Leiter, DO 10/12/22 [phone]

## 2022-10-11 NOTE — ED Provider Notes (Addendum)
[facility]    CSN: [id number] Arrival date & time: 10/11/22  1550      History   Chief Complaint Chief Complaint  Patient presents with   Hypertension    HPI Henry Schmitt is a 62 y.o. male.   Henry Schmitt is a 62 y.o. male presenting for chief complaint of right sided neck pain, headache, and elevated blood pressure reading that happened today while she was at work. Headache started suddenly to the generalized forehead initially, headache has moved to the left side of the forehead. Pain is described as intermittent sharp pains above the left eye, pain rated at 6/10 currently. Vision was blurry for a couple of hours while at work, vision normal now. Does not use prescription glasses or contacts for vision correction, sometimes uses reading glasses for near vision. No unilateral extremity weakness, changes in speech, dizziness, changes in gait, chest pain, heart palpitations, viral URI symptoms, or recent medication changes. Diagnosed with HTN many years ago, was taken off of antihypertensives 3-4 years ago due to improvement in BP with lifestyle changes. Took BP today after developing headache and it was in the 180s systolic at the highest. No personal or family history of stroke. Current everyday cigarette smoker (1/2 pack) for the last 45 years. No other drug use.  He has not attempted use of any over-the-counter medications to help with headache PTA.   Hypertension    Past Medical History:  Diagnosis Date   Hypertension     Patient Active Problem List   Diagnosis Date Noted   Influenza A 02/03/2016   Hypokalemia 02/03/2016   Bronchitis 02/03/2016   Anemia 02/03/2016   Aortitis (HCC) 04/18/2015   Abdominal pain 04/18/2015   Essential hypertension, benign 04/18/2015   Leukocytosis 05/07/2014   Hypogonadotropic hypogonadism (HCC) 04/06/2014   Gynecomastia, male 03/18/2014    Past Surgical History:  Procedure Laterality Date   CHOLECYSTECTOMY          Home Medications    Prior to Admission medications   Medication Sig Start Date End Date Taking? Authorizing Provider  losartan (COZAAR) 50 MG tablet Take 50 mg by mouth daily. 09/27/20   [provider]  meloxicam (MOBIC) 15 MG tablet Take 1 tablet (15 mg total) by mouth daily. 01/20/21   Hyatt, Max T, DPM  meloxicam (MOBIC) 15 MG tablet Take 1 tablet (15 mg total) by mouth daily. 04/25/22   Hyatt, Max T, DPM  rosuvastatin (CRESTOR) 10 MG tablet Take 10 mg by mouth daily. 09/27/20   [provider]  traZODone (DESYREL) 50 MG tablet Take 50 mg by mouth at bedtime as needed. 09/27/20   [provider]  albuterol (PROVENTIL HFA;VENTOLIN HFA) 108 (90 Base) MCG/ACT inhaler Inhale 2 puffs into the lungs every 4 (four) hours as needed for wheezing or shortness of breath. Patient not taking: Reported on 09/17/2018 12/27/17 10/31/19  Eustace Moore, MD    Family History Family History  Problem Relation Age of Onset   Heart failure Mother    Hypertension Mother    Diabetes Other     Social History Social History   Tobacco Use   Smoking status: Every Day    Current packs/day: 0.50    Types: Cigarettes   Smokeless tobacco: Never  Vaping Use   Vaping status: Never Used  Substance Use Topics   Alcohol use: No   Drug use: No     Allergies   Antihistamines, chlorpheniramine-type; Lisinopril; and Peanut-containing drug products  Review of Systems Review of Systems Per HPI  Physical Exam Triage Vital Signs ED Triage Vitals  Encounter Vitals Group     BP 10/11/22 1624 (!) 166/108     Systolic BP Percentile --      Diastolic BP Percentile --      Pulse Rate 10/11/22 1624 83     Resp 10/11/22 1624 16     Temp 10/11/22 1624 98.5 F (36.9 C)     Temp Source 10/11/22 1624 Oral     SpO2 10/11/22 1624 96 %     Weight --      Height --      Head Circumference --      Peak Flow --      Pain Score 10/11/22 1625 6     Pain Loc --      Pain  Education --      Exclude from Growth Chart --    No data found.  Updated Vital Signs BP (!) 166/108 (BP Location: Right Arm)   Pulse 83   Temp 98.5 F (36.9 C) (Oral)   Resp 16   SpO2 96%   Visual Acuity Right Eye Distance:   Left Eye Distance:   Bilateral Distance:    Right Eye Near:   Left Eye Near:    Bilateral Near:     Physical Exam Vitals and nursing note reviewed.  Constitutional:      Appearance: He is not ill-appearing or toxic-appearing.  HENT:     Head: Normocephalic and atraumatic.     Right Ear: Hearing and external ear normal.     Left Ear: Hearing and external ear normal.     Nose: Nose normal.     Mouth/Throat:     Lips: Pink.  Eyes:     General: Lids are normal. Vision grossly intact. Gaze aligned appropriately. No visual field deficit.    Extraocular Movements: Extraocular movements intact.     Conjunctiva/sclera: Conjunctivae normal.     Comments: EOMs intact without pain or dizziness elicited.  Cardiovascular:     Rate and Rhythm: Normal rate and regular rhythm.     Heart sounds: Normal heart sounds, S1 normal and S2 normal.  Pulmonary:     Effort: Pulmonary effort is normal. No respiratory distress.     Breath sounds: Normal breath sounds and air entry.  Musculoskeletal:     Cervical back: Neck supple.  Skin:    General: Skin is warm and dry.     Capillary Refill: Capillary refill takes less than 2 seconds.     Findings: No rash.  Neurological:     General: No focal deficit present.     Mental Status: He is alert and oriented to person, place, and time. Mental status is at baseline.     Cranial Nerves: Cranial nerves 2-12 are intact. No dysarthria or facial asymmetry.     Sensory: Sensation is intact.     Motor: Motor function is intact. No weakness, tremor, atrophy, abnormal muscle tone or pronator drift.     Coordination: Coordination is intact. Romberg sign negative. Coordination normal. Finger-Nose-Finger Test and Heel to [facility] Test  normal.     Gait: Gait is intact.     Comments: Strength and sensation intact to bilateral upper and lower extremities (5/5). Moves all 4 extremities with normal coordination voluntarily. Non-focal neuro exam.   Psychiatric:        Mood and Affect: Mood normal.        Speech:  Speech normal.        Behavior: Behavior normal.        Thought Content: Thought content normal.        Judgment: Judgment normal.      UC Treatments / Results  Labs (all labs ordered are listed, but only abnormal results are displayed) Labs Reviewed - No data to display  EKG   Radiology No results found.  Procedures Procedures (including critical care time)  Medications Ordered in UC Medications - No data to display  Initial Impression / Assessment and Plan / UC Course  I have reviewed the triage vital signs and the nursing notes.  Pertinent labs & imaging results that were available during my care of the patient were reviewed by me and considered in my medical decision making (see chart for details).  1.  Sudden onset severe headache, elevated blood pressure reading with diagnosis of hypertension, visual disturbance Presentation concerning for possible acute intracranial abnormality such as subarachnoid hemorrhage, TIA, mass, etc in the setting of significant high blood pressure and sudden onset severe headache.  He is currently neurologically intact to baseline and without focal deficit on neurologic exam.  Blood pressure increased to 183/103 on recheck.  We are unable to rule out intracranial abnormality with workup available urgent care, therefore recommend further evaluation and treatment at the nearest emergency department.  Discussed recommendations and risks of deferring ED visit with patient who expressed understanding and agreement with plan.  Vital signs stable, therefore may go to the nearest emergency department via private vehicle.   Final Clinical Impressions(s) / UC Diagnoses   Final  diagnoses:  Sudden onset of severe headache  Elevated blood pressure reading with diagnosis of hypertension  Visual disturbance     Discharge Instructions      Please go to the nearest emergency department for further workup and evaluation given sudden onset of symptoms, vision changes, and headache.     ED Prescriptions   None    PDMP not reviewed this encounter.   Carlisle Beers, FNP 10/11/22 1746    Carlisle Beers, FNP 10/11/22 1747

## 2022-10-12 NOTE — Discharge Instructions (Signed)
Please follow-up with your doctor in the next 2 weeks.  Check your blood pressure morning and evening.  You doctor might need to start you back on blood pressure medications.

## 2023-02-12 DIAGNOSIS — F17200 Nicotine dependence, unspecified, uncomplicated: Secondary | ICD-10-CM | POA: Insufficient documentation

## 2023-02-27 ENCOUNTER — Encounter: Payer: Self-pay | Admitting: Podiatry

## 2023-02-27 ENCOUNTER — Ambulatory Visit (INDEPENDENT_AMBULATORY_CARE_PROVIDER_SITE_OTHER): Payer: Medicaid Other | Admitting: Podiatry

## 2023-02-27 DIAGNOSIS — M7751 Other enthesopathy of right foot: Secondary | ICD-10-CM

## 2023-02-27 DIAGNOSIS — D2371 Other benign neoplasm of skin of right lower limb, including hip: Secondary | ICD-10-CM

## 2023-02-27 DIAGNOSIS — M7752 Other enthesopathy of left foot: Secondary | ICD-10-CM | POA: Diagnosis not present

## 2023-02-27 DIAGNOSIS — D2372 Other benign neoplasm of skin of left lower limb, including hip: Secondary | ICD-10-CM | POA: Diagnosis not present

## 2023-02-27 MED ORDER — DEXAMETHASONE SODIUM PHOSPHATE 120 MG/30ML IJ SOLN
2.0000 mg | Freq: Once | INTRAMUSCULAR | Status: AC
  Administered 2023-02-27: 2 mg via INTRA_ARTICULAR

## 2023-02-27 NOTE — Progress Notes (Signed)
 He presents today chief complaint of painful lesions plantar aspect of the fifth metatarsal heads bilaterally states that his left foot is really killing me and I like to consider surgical intervention at some point.  Objective: Vital signs stable alert oriented x 3.  There is no erythema edema salines drainage odor he has a palpable fluctuance beneath the fifth metatarsal head of the left foot with an overlying reactive hyperkeratotic lesion greater than that of the right.  Assessment: Bursitis capsulitis benign skin lesion left greater than right.  Plan: Debridement of benign skin lesion and injection of bursa with 2 mg of dexamethasone and local anesthetic today.  Follow-up with him on an as-needed basis to discuss surgical intervention which would consist of 1/5 metatarsal head resection.

## 2023-07-27 IMAGING — DX DG CHEST 1V PORT
1 series · 1 of 1 positions shown · non-contrast
Comparison: 09/17/2018

CLINICAL DATA: Cough and fever.  Headache.

EXAM:
PORTABLE CHEST 1 VIEW

[chest ap]
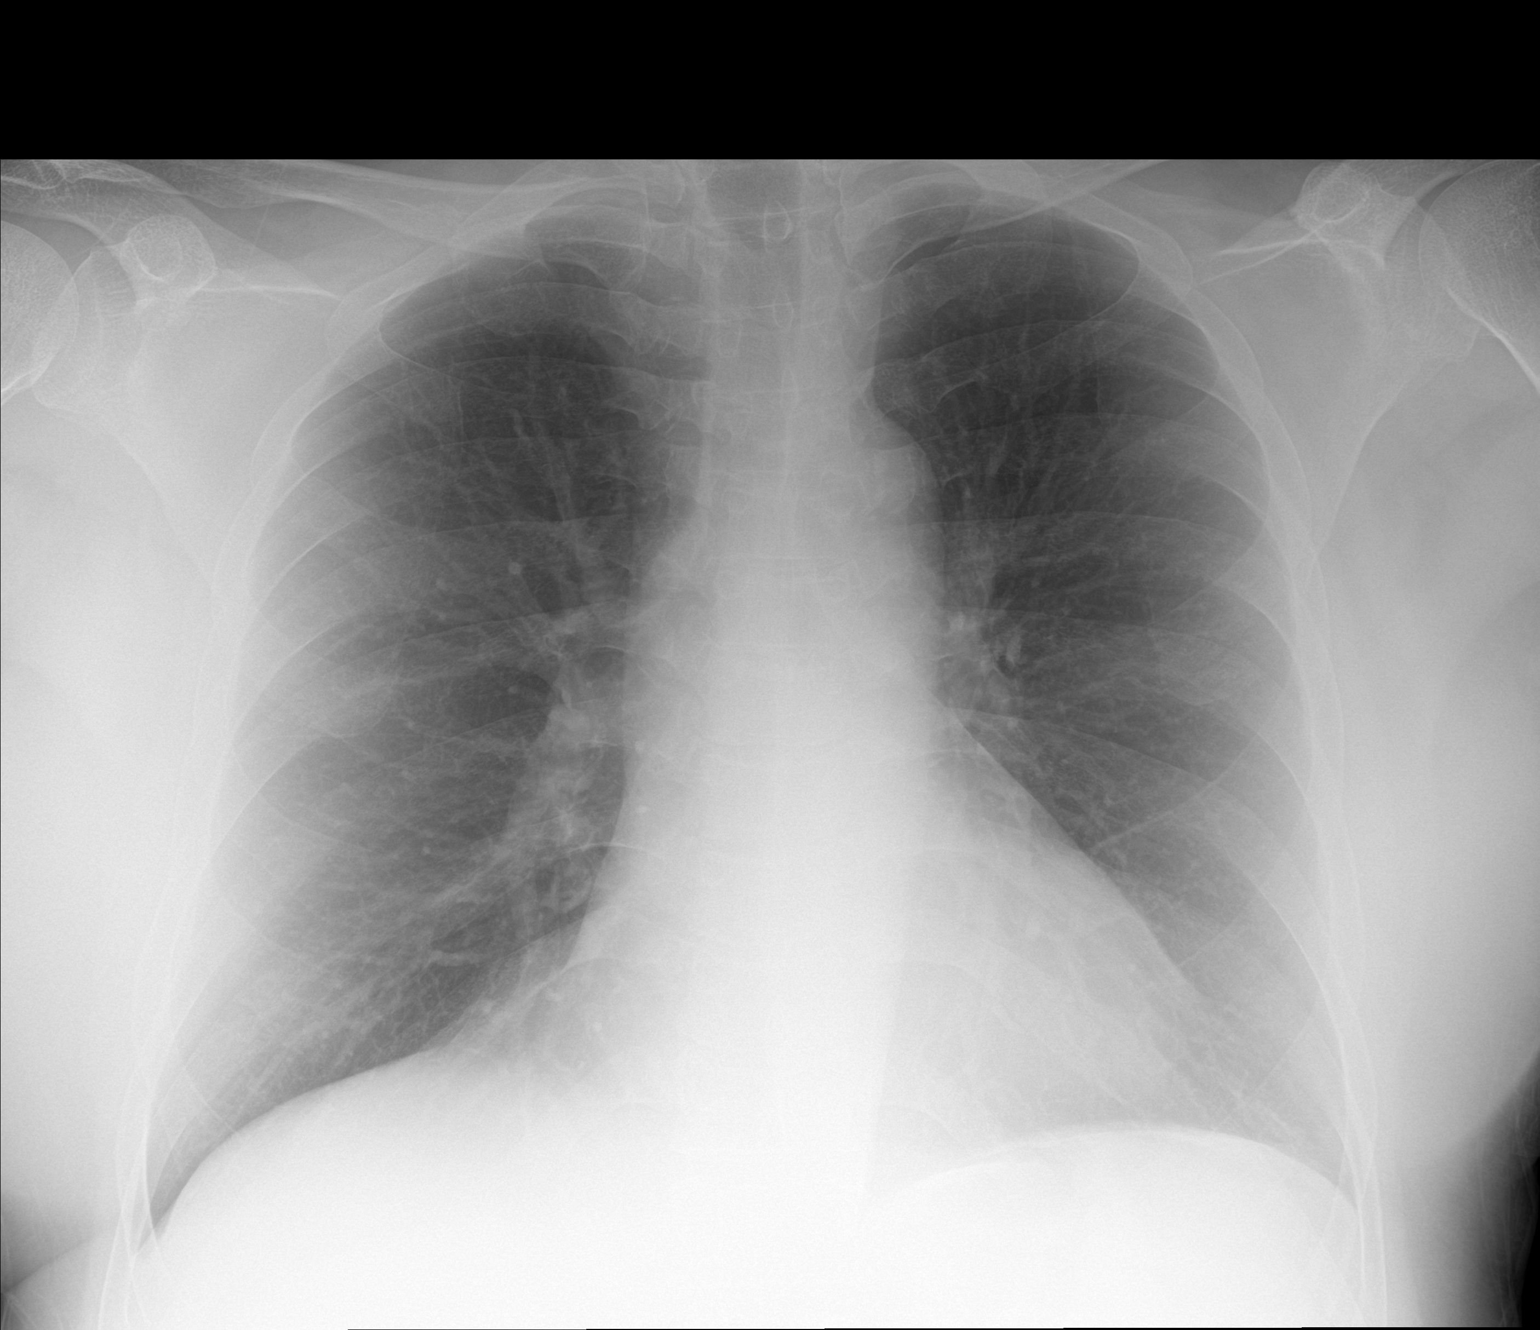

[1 of 1 positions shown; findings below may reference images not displayed]

FINDINGS: Heart size is normal. Lungs are free of focal consolidations and
pleural effusions. No pulmonary edema.
IMPRESSION: No active disease.

## 2023-08-14 ENCOUNTER — Emergency Department (HOSPITAL_COMMUNITY)

## 2023-08-14 ENCOUNTER — Inpatient Hospital Stay (HOSPITAL_COMMUNITY)
Admission: EM | Admit: 2023-08-14 | Discharge: 2023-08-17 | DRG: 064 | Disposition: A | Discharge status: Home / Self Care | Attending: Internal Medicine | Admitting: Internal Medicine

## 2023-08-14 ENCOUNTER — Encounter (HOSPITAL_COMMUNITY): Payer: Self-pay | Admitting: Internal Medicine

## 2023-08-14 ENCOUNTER — Observation Stay (HOSPITAL_COMMUNITY)

## 2023-08-14 ENCOUNTER — Observation Stay (HOSPITAL_BASED_OUTPATIENT_CLINIC_OR_DEPARTMENT_OTHER)

## 2023-08-14 ENCOUNTER — Other Ambulatory Visit: Payer: Self-pay

## 2023-08-14 DIAGNOSIS — Z833 Family history of diabetes mellitus: Secondary | ICD-10-CM

## 2023-08-14 DIAGNOSIS — Z91010 Allergy to peanuts: Secondary | ICD-10-CM

## 2023-08-14 DIAGNOSIS — I5021 Acute systolic (congestive) heart failure: Secondary | ICD-10-CM | POA: Diagnosis present

## 2023-08-14 DIAGNOSIS — E785 Hyperlipidemia, unspecified: Secondary | ICD-10-CM | POA: Diagnosis present

## 2023-08-14 DIAGNOSIS — I110 Hypertensive heart disease with heart failure: Secondary | ICD-10-CM | POA: Diagnosis present

## 2023-08-14 DIAGNOSIS — I10 Essential (primary) hypertension: Secondary | ICD-10-CM | POA: Diagnosis present

## 2023-08-14 DIAGNOSIS — L02215 Cutaneous abscess of perineum: Secondary | ICD-10-CM | POA: Diagnosis present

## 2023-08-14 DIAGNOSIS — I639 Cerebral infarction, unspecified: Principal | ICD-10-CM | POA: Diagnosis present

## 2023-08-14 DIAGNOSIS — Z716 Tobacco abuse counseling: Secondary | ICD-10-CM

## 2023-08-14 DIAGNOSIS — I6389 Other cerebral infarction: Secondary | ICD-10-CM

## 2023-08-14 DIAGNOSIS — Z634 Disappearance and death of family member: Secondary | ICD-10-CM

## 2023-08-14 DIAGNOSIS — D72829 Elevated white blood cell count, unspecified: Secondary | ICD-10-CM | POA: Diagnosis present

## 2023-08-14 DIAGNOSIS — I25110 Atherosclerotic heart disease of native coronary artery with unstable angina pectoris: Secondary | ICD-10-CM | POA: Diagnosis present

## 2023-08-14 DIAGNOSIS — Z6830 Body mass index (BMI) 30.0-30.9, adult: Secondary | ICD-10-CM

## 2023-08-14 DIAGNOSIS — E66811 Obesity, class 1: Secondary | ICD-10-CM | POA: Diagnosis present

## 2023-08-14 DIAGNOSIS — I429 Cardiomyopathy, unspecified: Secondary | ICD-10-CM | POA: Diagnosis present

## 2023-08-14 DIAGNOSIS — Z7982 Long term (current) use of aspirin: Secondary | ICD-10-CM

## 2023-08-14 DIAGNOSIS — I6381 Other cerebral infarction due to occlusion or stenosis of small artery: Secondary | ICD-10-CM | POA: Diagnosis not present

## 2023-08-14 DIAGNOSIS — I6329 Cerebral infarction due to unspecified occlusion or stenosis of other precerebral arteries: Principal | ICD-10-CM | POA: Diagnosis present

## 2023-08-14 DIAGNOSIS — G8191 Hemiplegia, unspecified affecting right dominant side: Secondary | ICD-10-CM | POA: Diagnosis present

## 2023-08-14 DIAGNOSIS — I447 Left bundle-branch block, unspecified: Secondary | ICD-10-CM | POA: Diagnosis not present

## 2023-08-14 DIAGNOSIS — Z888 Allergy status to other drugs, medicaments and biological substances status: Secondary | ICD-10-CM

## 2023-08-14 DIAGNOSIS — F17210 Nicotine dependence, cigarettes, uncomplicated: Secondary | ICD-10-CM | POA: Diagnosis present

## 2023-08-14 DIAGNOSIS — Z791 Long term (current) use of non-steroidal anti-inflammatories (NSAID): Secondary | ICD-10-CM

## 2023-08-14 DIAGNOSIS — R29700 NIHSS score 0: Secondary | ICD-10-CM | POA: Diagnosis present

## 2023-08-14 DIAGNOSIS — Z8249 Family history of ischemic heart disease and other diseases of the circulatory system: Secondary | ICD-10-CM

## 2023-08-14 DIAGNOSIS — I2489 Other forms of acute ischemic heart disease: Secondary | ICD-10-CM | POA: Diagnosis present

## 2023-08-14 DIAGNOSIS — Z91018 Allergy to other foods: Secondary | ICD-10-CM

## 2023-08-14 DIAGNOSIS — Z7902 Long term (current) use of antithrombotics/antiplatelets: Secondary | ICD-10-CM

## 2023-08-14 DIAGNOSIS — R29701 NIHSS score 1: Secondary | ICD-10-CM | POA: Diagnosis not present

## 2023-08-14 DIAGNOSIS — Z79899 Other long term (current) drug therapy: Secondary | ICD-10-CM

## 2023-08-14 LAB — CBC
HCT: 44.7 % (ref 39.0–52.0)
Hemoglobin: 14.3 g/dL (ref 13.0–17.0)
MCH: 30.2 pg (ref 26.0–34.0)
MCHC: 32 g/dL (ref 30.0–36.0)
MCV: 94.5 fL (ref 80.0–100.0)
Platelets: 339 K/uL (ref 150–400)
RBC: 4.73 MIL/uL (ref 4.22–5.81)
RDW: 13.6 % (ref 11.5–15.5)
WBC: 15.6 K/uL — ABNORMAL HIGH (ref 4.0–10.5)
nRBC: 0 % (ref 0.0–0.2)

## 2023-08-14 LAB — BASIC METABOLIC PANEL WITH GFR
Anion gap: 11 (ref 5–15)
BUN: 19 mg/dL (ref 8–23)
CO2: 24 mmol/L (ref 22–32)
Calcium: 9.4 mg/dL (ref 8.9–10.3)
Chloride: 99 mmol/L (ref 98–111)
Creatinine, Ser: 0.86 mg/dL (ref 0.61–1.24)
GFR, Estimated: >60 mL/min (ref >60)
Glucose, Bld: 114 mg/dL — ABNORMAL HIGH (ref 70–99)
Potassium: 3.9 mmol/L (ref 3.5–5.1)
Sodium: 134 mmol/L — ABNORMAL LOW (ref 135–145)

## 2023-08-14 LAB — ECHOCARDIOGRAM COMPLETE
Area-P 1/2: 3.65 cm2
Calc EF: 33.5 %
Height: 66 in
S' Lateral: 4.1 cm
Single Plane A2C EF: 34 %
Single Plane A4C EF: 33.3 %
Weight: 2992 [oz_av]

## 2023-08-14 LAB — TROPONIN I (HIGH SENSITIVITY)
Troponin I (High Sensitivity): 21 ng/L — ABNORMAL HIGH (ref <18)
Troponin I (High Sensitivity): 23 ng/L — ABNORMAL HIGH (ref <18)

## 2023-08-14 MED ORDER — IOHEXOL 350 MG/ML SOLN
75.0000 mL | Freq: Once | INTRAVENOUS | Status: AC | PRN
  Administered 2023-08-14: 75 mL via INTRAVENOUS

## 2023-08-14 MED ORDER — SODIUM CHLORIDE 0.9 % IV SOLN: INTRAVENOUS | Status: DC

## 2023-08-14 MED ORDER — ASPIRIN 81 MG PO TBEC
81.0000 mg | DELAYED_RELEASE_TABLET | Freq: Every day | ORAL | Status: DC
  Administered 2023-08-15 – 2023-08-17 (×3): 81 mg via ORAL
  Filled 2023-08-14 (×3): qty 1

## 2023-08-14 MED ORDER — ENOXAPARIN SODIUM 40 MG/0.4ML IJ SOSY
40.0000 mg | PREFILLED_SYRINGE | INTRAMUSCULAR | Status: DC
  Administered 2023-08-14 – 2023-08-16 (×3): 40 mg via SUBCUTANEOUS
  Filled 2023-08-14 (×3): qty 0.4

## 2023-08-14 MED ORDER — ACETAMINOPHEN 650 MG RE SUPP: 650.0000 mg | RECTAL | Status: DC | PRN

## 2023-08-14 MED ORDER — ASPIRIN 325 MG PO TABS: 325.0000 mg | ORAL_TABLET | Freq: Every day | ORAL | Status: DC

## 2023-08-14 MED ORDER — STROKE: EARLY STAGES OF RECOVERY BOOK
Freq: Once | Status: AC
  Filled 2023-08-14: qty 1

## 2023-08-14 MED ORDER — CLOPIDOGREL BISULFATE 75 MG PO TABS
75.0000 mg | ORAL_TABLET | Freq: Every day | ORAL | Status: DC
  Administered 2023-08-14 – 2023-08-17 (×4): 75 mg via ORAL
  Filled 2023-08-14 (×4): qty 1

## 2023-08-14 MED ORDER — ASPIRIN 300 MG RE SUPP: 300.0000 mg | Freq: Every day | RECTAL | Status: DC

## 2023-08-14 MED ORDER — ACETAMINOPHEN 500 MG PO TABS
1000.0000 mg | ORAL_TABLET | Freq: Once | ORAL | Status: AC
  Administered 2023-08-14: 1000 mg via ORAL
  Filled 2023-08-14: qty 2

## 2023-08-14 MED ORDER — ATORVASTATIN CALCIUM 40 MG PO TABS
40.0000 mg | ORAL_TABLET | Freq: Every day | ORAL | Status: DC
  Administered 2023-08-15 – 2023-08-17 (×3): 40 mg via ORAL
  Filled 2023-08-14 (×3): qty 1

## 2023-08-14 MED ORDER — ACETAMINOPHEN 325 MG PO TABS
650.0000 mg | ORAL_TABLET | ORAL | Status: DC | PRN
  Administered 2023-08-15 – 2023-08-17 (×3): 650 mg via ORAL
  Filled 2023-08-14 (×2): qty 2

## 2023-08-14 MED ORDER — NITROGLYCERIN 2 % TD OINT
0.5000 [in_us] | TOPICAL_OINTMENT | Freq: Once | TRANSDERMAL | Status: DC
  Filled 2023-08-14: qty 1

## 2023-08-14 MED ORDER — ACETAMINOPHEN 160 MG/5ML PO SOLN: 650.0000 mg | ORAL | Status: DC | PRN

## 2023-08-14 MED ORDER — ASPIRIN 81 MG PO CHEW
324.0000 mg | CHEWABLE_TABLET | Freq: Once | ORAL | Status: AC
  Administered 2023-08-14: 324 mg via ORAL
  Filled 2023-08-14: qty 4

## 2023-08-14 NOTE — ED Notes (Signed)
Pt to MRI with Transport

## 2023-08-14 NOTE — ED Provider Notes (Signed)
 [place] EMERGENCY DEPARTMENT AT [facility] Provider Note   CSN: [id number] Arrival date & time: 08/14/23  1034     Patient presents with: Hypertension and Weakness   Henry Schmitt is a 63 y.o. male.   63 year old male with past medical history of hypertension hyperlipidemia presenting to the emergency department today with chest pressure.  This is apparently been going on now intermittently now for the past 2 weeks.  The patient denies any severe pain with this.  He states that he also started to develop some right-sided weakness on Thursday of last week.  States that this is making it difficult to walk.  The patient also reports some dizziness with this.  He came to the ER today for further evaluation regarding this after seeing his primary care doctor and was sent in for concern for possible CVA.  The patient denies any severe headaches.   Hypertension Associated symptoms include chest pain.  Weakness Associated symptoms: chest pain        Prior to Admission medications   Medication Sig Start Date End Date Taking? Authorizing Provider  amLODipine  (NORVASC ) 2.5 MG tablet TAKE 1 TO 2 TABLETS BY MOUTH ONCE DAILY **  MONITOR  BLOOD  PRESSURE  WITH  GOAL  120/80  ** 01/16/23   [provider]  hydrochlorothiazide  (HYDRODIURIL ) 25 MG tablet Take 25 mg by mouth every morning.    [provider]  losartan (COZAAR) 50 MG tablet Take 50 mg by mouth daily. 09/27/20   [provider]  meloxicam  (MOBIC ) 15 MG tablet Take 1 tablet (15 mg total) by mouth daily. 01/20/21   Hyatt, Max T, DPM  meloxicam  (MOBIC ) 15 MG tablet Take 1 tablet (15 mg total) by mouth daily. 04/25/22   Hyatt, Max T, DPM  rosuvastatin (CRESTOR) 10 MG tablet Take 10 mg by mouth daily. 09/27/20   [provider]  zolpidem (AMBIEN) 10 MG tablet Take 10 mg by mouth at bedtime as needed.    [provider]  albuterol  (PROVENTIL  HFA;VENTOLIN  HFA) 108 (90 Base) MCG/ACT inhaler  Inhale 2 puffs into the lungs every 4 (four) hours as needed for wheezing or shortness of breath. Patient not taking: Reported on 09/17/2018 12/27/17 10/31/19  Maranda Jamee Jacob, MD    Allergies: Antihistamines, chlorpheniramine-type; Lisinopril; and Peanut-containing drug products    Review of Systems  Cardiovascular:  Positive for chest pain.  Neurological:  Positive for weakness.  All other systems reviewed and are negative.   Updated Vital Signs BP (!) 142/84   Pulse 66   Temp 98.3 F (36.8 C) (Oral)   Resp 14   Ht 5' 6 (1.676 m)   Wt 84.8 kg   SpO2 100%   BMI 30.18 kg/m   Physical Exam Vitals and nursing note reviewed.   Gen: NAD Eyes: PERRL, EOMI HEENT: no oropharyngeal swelling Neck: trachea midline Resp: clear to auscultation bilaterally Card: RRR, no murmurs, rubs, or gallops Abd: nontender, nondistended Extremities: no calf tenderness, no edema Vascular: 2+ radial pulses bilaterally, 2+ DP pulses bilaterally Neuro: Cranial nerves intact, the patient has 4.5 out of 5 strength in the right upper extremity and 4 out of 5 strength in the right lower extremity, reports normal sensation throughout Skin: no rashes Psyc: acting appropriately   (all labs ordered are listed, but only abnormal results are displayed) Labs Reviewed  BASIC METABOLIC PANEL WITH GFR - Abnormal; Notable for the following components:      Result Value   Sodium  134 (*)    Glucose, Bld 114 (*)    All other components within normal limits  CBC - Abnormal; Notable for the following components:   WBC 15.6 (*)    All other components within normal limits  TROPONIN I (HIGH SENSITIVITY) - Abnormal; Notable for the following components:   Troponin I (High Sensitivity) 21 (*)    All other components within normal limits  TROPONIN I (HIGH SENSITIVITY)    EKG: EKG Interpretation Date/Time:  Tuesday August 14 2023 10:37:07 EDT Ventricular Rate:  91 PR Interval:  164 QRS Duration:  162 QT  Interval:  413 QTC Calculation: 509 R Axis:   -12  Text Interpretation: Sinus rhythm Left bundle branch block Confirmed by Ula Barter ([id number]) on 08/14/2023 10:47:29 AM  Radiology: [facility] Chest Port 1 View Result Date: 08/14/2023 EXAM: 1 VIEW XRAY OF THE CHEST 08/14/2023 12:38:00 PM COMPARISON: 08/15/2020 CLINICAL HISTORY: CP. Reason for exam: CP; Triage notes: Pt BIB EMS from PCP office with CC of CP with HTN x 3 weeks. Pt also reported right sided weakness with alterations in balance and gait as a result x 6 days. Aox4. FINDINGS: LUNGS AND PLEURA: No focal pulmonary opacity. No pulmonary edema. No pleural effusion. No pneumothorax. HEART AND MEDIASTINUM: Borderline cardiomegaly. No acute abnormality of the mediastinal silhouette. BONES AND SOFT TISSUES: No acute osseous abnormality. Multiple wires and leads project over the chest on the frontal radiograph. IMPRESSION: 1. No acute findings. 2. Borderline cardiomegaly. Electronically signed by: Rockey Kilts MD 08/14/2023 01:43 PM EDT RP Workstation: [id number]   MR BRAIN WO CONTRAST Result Date: 08/14/2023 CLINICAL DATA:  Right-sided weakness with balance alteration EXAM: MRI HEAD WITHOUT CONTRAST TECHNIQUE: Multiplanar, multiecho pulse sequences of the brain and surrounding structures were obtained without intravenous contrast. COMPARISON:  April 09, 2014 FINDINGS: MRI brain: There is acute infarct in the right side of the pons. There are several foci of T2 hyperintensity in the cerebral white matter. These do not have restricted diffusion. The ventricles are normal. No mass lesion. There are normal flow signals in the carotid arteries and basilar artery. No significant bone marrow signal abnormality. No significant abnormality in the paranasal sinuses or soft tissues. IMPRESSION: Acute right pontine infarct Electronically Signed   By: Nancyann Burns M.D.   On: 08/14/2023 11:53     Procedures   Medications Ordered in the ED  nitroGLYCERIN  (NITROGLYN) 2 %  ointment 0.5 inch (has no administration in time range)  aspirin  chewable tablet 324 mg (has no administration in time range)  acetaminophen  (TYLENOL ) tablet 1,000 mg (1,000 mg Oral Given 08/14/23 1113)                                    Medical Decision Making 63 year old male with past medical history of hypertension hyperlipidemia presenting to the emergency department today with chest pressure as well as some right-sided weakness that are not temporally related.  I will further evaluate the patient here with basic lab as well as an EKG, chest x-ray, and troponin to evaluate for ACS, pulmonary edema, pulmonary infiltrates, or pneumothorax.  The patient does have some mild weakness here on exam.  Will obtain MRI for further evaluation for CVA.  The patient denies any severe pain.  He was also having chest pressure for quite some time before this occurred so suspicion for aortic dissection is low at this time.  I will give the patient  nitroglycerin  ointment as well as Tylenol  to prevent headache.  He is clearly outside the window for any acute neurologic intervention at this time with symptoms going-year-old male for 5 days.  I will reevaluate for ultimate disposition.  The patient's EKG interpreted by me does show a new left bundle branch block.  The patient's initial troponin is mildly elevated but similar to previous levels.  MRI does show acute stroke.  The patient is given aspirin .  Call was placed to hospitalist service for admission.  Amount and/or Complexity of Data Reviewed Labs: ordered. Radiology: ordered.  Risk OTC drugs. Prescription drug management. Decision regarding hospitalization.        Final diagnoses:  Acute CVA (cerebrovascular accident) [facility])    ED Discharge Orders     None          Ula Prentice SAUNDERS, MD 08/14/23 1422

## 2023-08-14 NOTE — Progress Notes (Signed)
  Echocardiogram 2D Echocardiogram has been performed.  Roseanne Juenger 08/14/2023, 6:07 PM

## 2023-08-14 NOTE — Consult Note (Signed)
 NEUROLOGY CONSULT NOTE   Date of service: August 14, 2023 Patient Name: Henry Schmitt MRN:  [id number] DOB:  1960/03/05 Chief Complaint: Stroke on MRI  Requesting Provider: Verdene Purchase, MD  History of Present Illness  Henry Schmitt is a 63 y.o. male with hx of hypertension presenting to the emergency department on 8/5 after seeing his PCP and reporting endorses headache, right sided weakness and double vision starting Thursday.  He was instructed to go to the emergency department by his PCP for a stroke workup.  He does report some difficulty walking due to the right-sided weakness.  Double vision has improved. he additionally reported chest pressure. This is apparently been going on now intermittently now for the past 2 weeks. The patient denies any severe pain with this.   LKW: Thursday  Modified rankin score: 0-Completely asymptomatic and back to baseline post- stroke IV Thrombolysis: No, outside of window EVT: No, outside of window, no LVO   NIHSS components Score: Comment  1a Level of Conscious 0[x]  1[]  2[]  3[]      1b LOC Questions 0[x]  1[]  2[]       1c LOC Commands 0[x]  1[]  2[]       2 Best Gaze 0[x]  1[]  2[]       3 Visual 0[x]  1[]  2[]  3[]      4 Facial Palsy 0[x]  1[]  2[]  3[]      5a Motor Arm - left 0[x]  1[]  2[]  3[]  4[]  UN[]    5b Motor Arm - Right 0[x]  1[]  2[]  3[]  4[]  UN[]    6a Motor Leg - Left 0[x]  1[]  2[]  3[]  4[]  UN[]    6b Motor Leg - Right 0[]  1[x]  2[]  3[]  4[]  UN[]    7 Limb Ataxia 0[]  1[x]  2[]  UN[]      8 Sensory 0[x]  1[]  2[]  UN[]      9 Best Language 0[x]  1[]  2[]  3[]      10 Dysarthria 0[x]  1[]  2[]  UN[]      11 Extinct. and Inattention 0[x]  1[]  2[]       TOTAL:1       ROS  Comprehensive ROS performed and pertinent positives documented in HPI   Past History   Past Medical History:  Diagnosis Date   Hypertension     Past Surgical History:  Procedure Laterality Date   CHOLECYSTECTOMY      Family History: Family History  Problem Relation Age of Onset   Heart  failure Mother    Hypertension Mother    Diabetes Other     Social History  reports that he has been smoking cigarettes. He has never used smokeless tobacco. He reports current alcohol use. He reports that he does not currently use drugs.  Allergies  Allergen Reactions   Peanut-Containing Drug Products Swelling    ALL TREE NUTS   Antihistamines, Chlorpheniramine-Type Hives and Swelling   Lisinopril Swelling    Medications   Current Facility-Administered Medications:     stroke: early stages of recovery book, , Does not apply, Once, Verdene Purchase, MD   0.9 %  sodium chloride  infusion, , Intravenous, Continuous, Krishnan, Gokul, MD   acetaminophen  (TYLENOL ) tablet 650 mg, 650 mg, Oral, Q4H PRN **OR** acetaminophen  (TYLENOL ) 160 MG/5ML solution 650 mg, 650 mg, Per Tube, Q4H PRN **OR** acetaminophen  (TYLENOL ) suppository 650 mg, 650 mg, Rectal, Q4H PRN, Krishnan, Gokul, MD   aspirin  chewable tablet 324 mg, 324 mg, Oral, Once, Ula Prentice SAUNDERS, MD   [START ON 08/15/2023] aspirin  suppository 300 mg, 300 mg, Rectal, Daily **OR** [START ON 08/15/2023] aspirin  tablet 325 mg, 325 mg,  Oral, Daily, Krishnan, Gokul, MD   enoxaparin  (LOVENOX ) injection 40 mg, 40 mg, Subcutaneous, Q24H, Krishnan, Gokul, MD   nitroGLYCERIN  (NITROGLYN) 2 % ointment 0.5 inch, 0.5 inch, Topical, Once, Tee, Andrew R, MD  Current Outpatient Medications:    amLODipine  (NORVASC ) 2.5 MG tablet, TAKE 1 TO 2 TABLETS BY MOUTH ONCE DAILY **  MONITOR  BLOOD  PRESSURE  WITH  GOAL  120/80  **, Disp: , Rfl:    hydrochlorothiazide  (HYDRODIURIL ) 25 MG tablet, Take 25 mg by mouth every morning., Disp: , Rfl:    ibuprofen  (ADVIL ) 200 MG tablet, Take 200-600 mg by mouth every 6 (six) hours as needed., Disp: , Rfl:    zolpidem (AMBIEN) 10 MG tablet, Take 10 mg by mouth at bedtime as needed., Disp: , Rfl:   Vitals   Vitals:   2023/08/18 1300 2023-08-18 1315 08/18/2023 1330 2023/08/18 1345  BP: (!) 143/86 (!) 155/88 (!) 148/91 (!) 142/84  Pulse:  64 66 66 66  Resp: 14 17 15 14   Temp:      TempSrc:      SpO2: 100% 100% 100% 100%  Weight:      Height:        Body mass index is 30.18 kg/m.   Physical Exam   Constitutional: Appears well-developed and well-nourished.  Psych: Affect appropriate to situation.  Eyes: No scleral injection.  HENT: No OP obstruction.  Head: Normocephalic.  Cardiovascular: Normal rate and regular rhythm.  Respiratory: Effort normal, non-labored breathing.  GI: Soft.  No distension. There is no tenderness.  Skin: WDI.   Neurologic Examination  Neuro: Mental Status: Patient is awake, alert, oriented to person, place, month, year, and situation. Patient is able to give a clear and coherent history. No signs of aphasia or neglect Cranial Nerves: II: Visual Fields are full. Pupils are equal, round, and reactive to light.   III,IV, VI: EOMI without ptosis or diploplia.  No nystagmus noted. V: Facial sensation is symmetric to temperature VII: Facial movement is symmetric resting and smiling VIII: Hearing is intact to voice X: Palate elevates symmetrically XI: Shoulder shrug is symmetric. XII: Tongue protrudes midline without atrophy or fasciculations.  Motor: Tone is normal. Bulk is normal. Right leg drift Sensory: Sensation is symmetric to light touch and temperature in the arms and legs. No extinction to DSS present.  Cerebellar: Right lower extremity with some ataxia out of proportion to weakness.    Labs/Imaging/Neurodiagnostic studies   CBC:  Recent Labs  Lab 18-Aug-2023 1046  WBC 15.6*  HGB 14.3  HCT 44.7  MCV 94.5  PLT 339   Basic Metabolic Panel:  Lab Results  Component Value Date   NA 134 (L) 08/18/23   K 3.9 08/18/23   CO2 24 08-18-23   GLUCOSE 114 (H) 08/18/2023   BUN 19 Aug 18, 2023   CREATININE 0.86 08-18-23   CALCIUM  9.4 08/18/23   GFRNONAA >60 08/18/23   GFRAA >60 02/05/2016   CT Head without contrast(Personally reviewed): No acute intracranial  abnormality.   CT angio Head and Neck with contrast(Personally reviewed): pending  MRI Brain(Personally reviewed): Acute right pontine infarct   ASSESSMENT   Henry Schmitt is a 63 y.o. male with a past medical history of hypertension presenting with a right pontine stroke on MRI.  On exam he does not have any visual restriction or nystagmus.  He does have some ataxia and drift noted in the right lower extremity.  Hand grasp is equal.  CTA head and neck and echocardiogram are  pending.  Will start antiplatelet therapy with aspirin  and Plavix .  RECOMMENDATIONS  - HgbA1c, fasting lipid panel - CTA Head and Neck - Frequent neuro checks - Echocardiogram - Prophylactic therapy-Antiplatelet med: Aspirin  81mg  and Plavix  75mg  daily.  Duration of antiplatelets to be determined after vessel imaging - Risk factor modification - Telemetry monitoring - PT consult, OT consult, Speech consult - Stroke team to follow  ______________________________________________________________________    Signed, Jorene Last, NP [facility]   Attending Neurohospitalist Addendum Patient seen and examined with APP/Resident. Agree with the history and physical as documented above. Agree with the plan as documented, which I helped formulate. I have independently reviewed the chart, obtained history, review of systems and examined the patient.I have personally reviewed pertinent head/neck/spine imaging (CT/MRI).  Plan relayed to Dr. Verdene Please feel free to call with any questions.  -- Eligio Lav, MD Neurologist [facility] Pager: [phone]

## 2023-08-14 NOTE — ED Triage Notes (Signed)
 Pt BIB EMS from PCP office with CC of CP with HTN x 3 weeks. Pt also reported right sided weakness with alterations in balance and gait as a result x 6 days. Aox4.

## 2023-08-14 NOTE — H&P (Signed)
 [facility] History and Physical  Henry Schmitt FMW:[id number] DOB: 01-01-1961 DOA: 08/14/2023   PCP: Henry Hoy CROME, PA-C  Specialists: None  Chief Complaint: Right-sided weakness with unsteady gait   HPI: Henry Schmitt is a 63 y.o. male with a past medical history of essential hypertension who was in his usual state of health till this past Friday when he started noticing that he was dragging his right foot.  He was also leaning towards the right and had to correct himself by pushing himself off of the furniture while walking.  He also mentioned visual disturbances in the form of difficulty reading recently.  Has had a headache since last week.  Denies any falls or injuries.  Decided to go to his primary care physician's office today from where he was sent to the emergency department.  He also mentions that for the past 1/2 weeks or so he has had shortness of breath and chest discomfort with exertion.  Denies any chest pain currently.  In the emergency department he was found to have an acute stroke on MRI.  New LBBB noted on his EKG.  He will be hospitalized for further management.  Home Medications: Prior to Admission medications   Medication Sig Start Date End Date Taking? Authorizing Provider  amLODipine  (NORVASC ) 2.5 MG tablet TAKE 1 TO 2 TABLETS BY MOUTH ONCE DAILY **  MONITOR  BLOOD  PRESSURE  WITH  GOAL  120/80  ** 01/16/23  Yes [provider]  hydrochlorothiazide  (HYDRODIURIL ) 25 MG tablet Take 25 mg by mouth every morning.   Yes [provider]  ibuprofen  (ADVIL ) 200 MG tablet Take 200-600 mg by mouth every 6 (six) hours as needed.   Yes [provider]  zolpidem (AMBIEN) 10 MG tablet Take 10 mg by mouth at bedtime as needed.   Yes [provider]  albuterol  (PROVENTIL  HFA;VENTOLIN  HFA) 108 (90 Base) MCG/ACT inhaler Inhale 2 puffs into the lungs every 4 (four) hours as needed for wheezing or shortness of breath. Patient not taking:  Reported on 09/17/2018 12/27/17 10/31/19  Maranda Jamee Jacob, MD    Allergies:  Allergies  Allergen Reactions   Peanut-Containing Drug Products Swelling    ALL TREE NUTS   Antihistamines, Chlorpheniramine-Type Hives and Swelling   Lisinopril Swelling    Past Medical History: Past Medical History:  Diagnosis Date   Hypertension     Past Surgical History:  Procedure Laterality Date   CHOLECYSTECTOMY      Social History: Smokes half pack of cigarettes on a daily basis.  Drinks of brandy or beer 3 times a week.  No recreational drug use.  Lives by himself.  Family History:  Family History  Problem Relation Age of Onset   Heart failure Mother    Hypertension Mother    Diabetes Other      Review of Systems - History obtained from the patient General ROS: negative Psychological ROS: negative Ophthalmic ROS: As in HPI ENT ROS: negative Allergy and Immunology ROS: negative Hematological and Lymphatic ROS: negative Endocrine ROS: negative Respiratory ROS: As in HPI Cardiovascular ROS: As in HPI Gastrointestinal ROS: no abdominal pain, change in bowel habits, or black or bloody stools Genito-Urinary ROS: no dysuria, trouble voiding, or hematuria Musculoskeletal ROS: negative Neurological ROS: no TIA or stroke symptoms Dermatological ROS: negative  Physical Examination  Vitals:   08/14/23 1300 08/14/23 1315 08/14/23 1330 08/14/23 1345  BP: (!) 143/86 (!) 155/88 (!) 148/91 (!) 142/84  Pulse: 64 66 66 66  Resp: 14 17 15 14   Temp:      TempSrc:      SpO2: 100% 100% 100% 100%  Weight:      Height:        BP (!) 142/84   Pulse 66   Temp 98.3 F (36.8 C) (Oral)   Resp 14   Ht 5' 6 (1.676 m)   Wt 84.8 kg   SpO2 100%   BMI 30.18 kg/m   General appearance: alert, cooperative, appears stated age, and no distress Head: Normocephalic, without obvious abnormality, atraumatic Eyes: conjunctivae/corneas clear. PERRL, EOM's intact.  Throat: lips, mucosa, and tongue  normal; teeth and gums normal Neck: no adenopathy, no carotid bruit, no JVD, supple, symmetrical, trachea midline, and thyroid not enlarged, symmetric, no tenderness/mass/nodules Resp: clear to auscultation bilaterally Cardio: regular rate and rhythm, S1, S2 normal, no murmur, click, rub or gallop GI: soft, non-tender; bowel sounds normal; no masses,  no organomegaly Extremities: extremities normal, atraumatic, no cyanosis or edema Pulses: 2+ and symmetric Skin: Skin color, texture, turgor normal. No rashes or lesions Lymph nodes: Cervical, supraclavicular, and axillary nodes normal. Neurologic: Alert and oriented x 3.  Cranial nerves II to XII intact.  No pronator drift.  Upper extremity strength is 5 out of 5 bilaterally.  Lower extremity strength is 3-4 out of 5 in the right leg.   Labs on Admission: I have personally reviewed following labs and imaging studies  CBC: Recent Labs  Lab 08/14/23 1046  WBC 15.6*  HGB 14.3  HCT 44.7  MCV 94.5  PLT 339   Basic Metabolic Panel: Recent Labs  Lab 08/14/23 1046  NA 134*  K 3.9  CL 99  CO2 24  GLUCOSE 114*  BUN 19  CREATININE 0.86  CALCIUM  9.4   GFR: Estimated Creatinine Clearance: 89.8 mL/min (by C-G formula based on SCr of 0.86 mg/dL).   Radiological Exams on Admission: DG Chest Port 1 View Result Date: 08/14/2023 EXAM: 1 VIEW XRAY OF THE CHEST 08/14/2023 12:38:00 PM COMPARISON: 08/15/2020 CLINICAL HISTORY: CP. Reason for exam: CP; Triage notes: Pt BIB EMS from PCP office with CC of CP with HTN x 3 weeks. Pt also reported right sided weakness with alterations in balance and gait as a result x 6 days. Aox4. FINDINGS: LUNGS AND PLEURA: No focal pulmonary opacity. No pulmonary edema. No pleural effusion. No pneumothorax. HEART AND MEDIASTINUM: Borderline cardiomegaly. No acute abnormality of the mediastinal silhouette. BONES AND SOFT TISSUES: No acute osseous abnormality. Multiple wires and leads project over the chest on the frontal  radiograph. IMPRESSION: 1. No acute findings. 2. Borderline cardiomegaly. Electronically signed by: Rockey Kilts MD 08/14/2023 01:43 PM EDT RP Workstation: [id number]   MR BRAIN WO CONTRAST Result Date: 08/14/2023 CLINICAL DATA:  Right-sided weakness with balance alteration EXAM: MRI HEAD WITHOUT CONTRAST TECHNIQUE: Multiplanar, multiecho pulse sequences of the brain and surrounding structures were obtained without intravenous contrast. COMPARISON:  April 09, 2014 FINDINGS: MRI brain: There is acute infarct in the right side of the pons. There are several foci of T2 hyperintensity in the cerebral white matter. These do not have restricted diffusion. The ventricles are normal. No mass lesion. There are normal flow signals in the carotid arteries and basilar artery. No significant bone marrow signal abnormality. No significant abnormality in the paranasal sinuses or soft tissues. IMPRESSION: Acute right pontine infarct Electronically Signed   By: Nancyann Burns M.D.   On: 08/14/2023 11:53    My interpretation of Electrocardiogram: Sinus rhythm in  the 90s.  Normal axis.  LBBB is noted.  New compared to previous EKGs.   Problem List  Principal Problem:   Acute CVA (cerebrovascular accident) [facility]) Active Problems:   Essential hypertension, benign   LBBB (left bundle branch block)   Assessment: This is a 63 year old African-American male with past medical history as stated earlier who comes in with right-sided weakness and unsteady gait ongoing since Friday.  Also mentioned shortness of breath and some chest discomfort with exertion for the past several weeks.  Found to have an acute stroke on MRI though it is a right-sided stroke based on report.  Patient's symptoms are predominantly right-sided.  EKG shows new LBBB.  Plan:  #1. Acute stroke: Neurology will be consulted.  CTA head and neck will be ordered.  Echocardiogram.  Check lipid panel and HbA1c.  PT OT SLP evaluation.  Aspirin .  He is not any  antiplatelets on a regular basis.  #2. Exertional dyspnea and chest discomfort with new LBBB: First troponin is only 21.  Follow-up on the second troponin.  Follow-up on echocardiogram.  May need further cardiac workup as well depending on echo findings.  #3. Essential hypertension: Allow permissive hypertension for now.  Hold his antihypertensives.  #4.  Alcohol use: He drinks brandy or beer about 3 times a week. Has never had withdrawal symptoms previously.  #5.  Tobacco use disorder: Counseled.   DVT Prophylaxis: Lovenox  Code Status: Full code Family Communication: Discussed with patient Disposition: Hopefully return home when improved Consults called: Neurology Admission Status: Status is: Observation The patient remains OBS appropriate and will d/c before 2 midnights.    Severity of Illness: The appropriate patient status for this patient is OBSERVATION. Observation status is judged to be reasonable and necessary in order to provide the required intensity of service to ensure the patient's safety. The patient's presenting symptoms, physical exam findings, and initial radiographic and laboratory data in the context of their medical condition is felt to place them at decreased risk for further clinical deterioration. Furthermore, it is anticipated that the patient will be medically stable for discharge from the hospital within 2 midnights of admission.    Further management decisions will depend on results of further testing and patient's response to treatment.   Henry Schmitt  [facility] Pager on [email or web address]  08/14/2023, 3:10 PM

## 2023-08-14 NOTE — Progress Notes (Addendum)
 Patient ID:  Henry Schmitt is a 63 y.o. (DOB 08-14-1960) male.  Assessment and Plan   1. Chest pain, unspecified type   2. Essential hypertension   3. Generalized headache   4. Right leg weakness   5. Tobacco use   6. Smoker     Assessment & Plan Chest pain EKG: Left bundle branch block, HR 93, PR interval 172, suggesting ischemia. Exertional chest pain for 3 weeks. Discussed potential MI and need for immediate intervention. EMS called for transport. Administered 325 mg aspirin  at 924 AM. Clinical decision making: Immediate intervention due to potential MI.  9:40 AM Called patient's sister per his request, Arland Lesches and made her aware of patient's condition and that he was being transported to [facility].  Headache Headache from Thursday to Friday night, with BP 184/117. Continues mild headaches and pressure on right side. Diagnostic plan: ER evaluation needed. Clinical decision making: Elevated BP and persistent headache require ER evaluation.  Elevated BP BP today 170/94. Takes amlodipine  2.5 mg and hydrochlorothiazide  25 mg daily. Diagnostic plan: ER evaluation needed. Treatment plan: Continue current antihypertensives. Clinical decision making: Elevated BP requires ER evaluation.  Gait disturbance Difficulty lifting right leg, dragging it, balance issues since Thursday. Concern for potential brain ischemia. Diagnostic plan: ER evaluation needed. Clinical decision making: Potential brain ischemia requires ER evaluation.  Smoking cessation Smokes half a pack/day, wants to quit. Discussed nicotine patches, Chantix, Wellbutrin. Treatment plan: Comprehensive smoking cessation plan after cardiac concerns managed.  Follow-up: After ER evaluation and management of cardiac concerns.   Results Diagnostic Testing  - EKG: Left bundle branch block with heart rate of 93, PR interval 172.   Patient understands and agrees with plan. Computer technology was used  to create visit note. Consent from the patient/caregiver was obtained prior to use.  No follow-ups on file.    Patient's Medications       * Accurate as of August 14, 2023  9:31 AM. Reflects encounter med changes as of last refresh          Continued Medications      Instructions  amLODIPine  besylate 2.5 mg tablet Commonly known as: NORVASC   TAKE 1 TO 2 TABLETS BY MOUTH ONCE DAILY **  MONITOR  BLOOD  PRESSURE  WITH  GOAL  120/80  **   hydroCHLOROthiazide  25 mg tablet  25 mg, Oral, Daily, Take on tablet by mouth once a day in the morning.   zolpidem tartrate 10 mg tablet Commonly known as: AMBIEN  10 mg, Oral, At bedtime as needed, Take one tablet by mouth at bedtime as needed for sleep.        Orders Placed This Encounter  Procedures  . ECG 12 lead    Risks, benefits, and alternatives of the medications and treatment plan prescribed today were discussed, and patient expressed understanding. Plan follow-up as discussed or as needed if any worsening symptoms or change in condition.    A yearly preventative health exam was recommended and current age based recommendations were discussed.   Subjective   Patient ID:  Henry Schmitt is a 63 y.o. (DOB April 05, 1960) male    Patient presents with  . elevated bp    W/ ha, dizzyness, chest pain and SOB. Pt is really fatigued and sluggish. Has had cp x 2 weeks. Ha since Thursday. Feels like his rt leg drags when he walks. Has a lot of pressure on right side of his head.      HPI:  History of Present Illness 63 year old male presents with headache, dizziness, chest pain, and dyspnea.  Reports fatigue and sluggishness. Chest pain for 2-3 weeks, especially with exertion, accompanied by dyspnea, heat sensation, and diaphoresis. No current chest pain. No aspirin  in last 24 hours. Takes amlodipine  and hydrochlorothiazide  daily.  Severe headache from Thursday to Friday night. BP recorded at 184/117. Advised to seek ER care by on  call triage nurse with [facility] but did not due to work. BP improved after taking medication and resting. Continues to have mild headaches and pressure over eyes.  Since Thursday, gait change with R leg dragging and balance issues, requiring support.  Smokes half a pack/day, interested in quitting.  Last visit on 04/09/2023 for perirectal abscess with his former PCP. Prescribed amlodipine  2.5 mg, hydrochlorothiazide  25 mg, and Ambien 10 mg prn for insomnia.   Occupation: Automotive engineer Hobbies: Walking dog Tobacco: Smokes half a pack/day    Past Medical History, Past Surgery History, Allergies, Social History, and Family History were reviewed and updated.    Review of Systems  Constitutional: Negative.   HENT: Negative.    Respiratory:  Positive for shortness of breath (with exertion).   Cardiovascular:  Positive for chest pain.  Gastrointestinal: Negative.   Musculoskeletal: Negative.   Neurological:  Positive for weakness (RLE) and headaches.  All other systems reviewed and are negative.    Objective   Vitals:   08/14/23 0903  BP: (!) 170/94  Patient Position: Sitting  Pulse: 102  Temp: 97.2 F (36.2 C)  TempSrc: Skin  Resp: 18  Height: 5' 6 (1.676 m)  Weight: 185 lb 6.4 oz (84.1 kg)  SpO2: 96%  BMI (Calculated): 29.9  PainSc: 0-No pain      Physical Exam Vitals and nursing note reviewed.  Constitutional:      General: He is not in acute distress.    Appearance: Normal appearance.  HENT:     Head: Normocephalic and atraumatic.     Nose: Nose normal.  Eyes:     Extraocular Movements: Extraocular movements intact.     Pupils: Pupils are equal, round, and reactive to light.  Cardiovascular:     Rate and Rhythm: Normal rate and regular rhythm.     Heart sounds: Normal heart sounds.  Musculoskeletal:        General: Normal range of motion.     Cervical back: Normal range of motion.  Pulmonary:     Effort: Pulmonary effort is normal.     Breath sounds:  Normal breath sounds. No wheezing or rhonchi.  Skin:    General: Skin is warm and dry.  Neurological:     General: No focal deficit present.     Mental Status: He is alert and oriented to person, place, and time.       Voice recognition software was used and creation of this note. Despite my best effort at editing the text, some misspelling/errors may have occurred. *Some images could not be shown.

## 2023-08-15 DIAGNOSIS — I639 Cerebral infarction, unspecified: Secondary | ICD-10-CM | POA: Diagnosis not present

## 2023-08-15 DIAGNOSIS — I6523 Occlusion and stenosis of bilateral carotid arteries: Secondary | ICD-10-CM | POA: Diagnosis not present

## 2023-08-15 DIAGNOSIS — I700 Atherosclerosis of aorta: Secondary | ICD-10-CM | POA: Diagnosis not present

## 2023-08-15 DIAGNOSIS — Z87891 Personal history of nicotine dependence: Secondary | ICD-10-CM

## 2023-08-15 DIAGNOSIS — I5020 Unspecified systolic (congestive) heart failure: Secondary | ICD-10-CM

## 2023-08-15 DIAGNOSIS — I63541 Cerebral infarction due to unspecified occlusion or stenosis of right cerebellar artery: Secondary | ICD-10-CM | POA: Diagnosis not present

## 2023-08-15 DIAGNOSIS — F1010 Alcohol abuse, uncomplicated: Secondary | ICD-10-CM

## 2023-08-15 DIAGNOSIS — F17210 Nicotine dependence, cigarettes, uncomplicated: Secondary | ICD-10-CM

## 2023-08-15 DIAGNOSIS — I10 Essential (primary) hypertension: Secondary | ICD-10-CM | POA: Diagnosis not present

## 2023-08-15 DIAGNOSIS — I5041 Acute combined systolic (congestive) and diastolic (congestive) heart failure: Secondary | ICD-10-CM | POA: Diagnosis not present

## 2023-08-15 DIAGNOSIS — I6381 Other cerebral infarction due to occlusion or stenosis of small artery: Secondary | ICD-10-CM | POA: Diagnosis not present

## 2023-08-15 DIAGNOSIS — I739 Peripheral vascular disease, unspecified: Secondary | ICD-10-CM

## 2023-08-15 DIAGNOSIS — E785 Hyperlipidemia, unspecified: Secondary | ICD-10-CM

## 2023-08-15 DIAGNOSIS — I447 Left bundle-branch block, unspecified: Secondary | ICD-10-CM | POA: Diagnosis not present

## 2023-08-15 LAB — COMPREHENSIVE METABOLIC PANEL WITH GFR
ALT: 27 U/L (ref 0–44)
AST: 24 U/L (ref 15–41)
Albumin: 3.3 g/dL — ABNORMAL LOW (ref 3.5–5.0)
Alkaline Phosphatase: 97 U/L (ref 38–126)
Anion gap: 8 (ref 5–15)
BUN: 16 mg/dL (ref 8–23)
CO2: 26 mmol/L (ref 22–32)
Calcium: 9.1 mg/dL (ref 8.9–10.3)
Chloride: 101 mmol/L (ref 98–111)
Creatinine, Ser: 0.98 mg/dL (ref 0.61–1.24)
GFR, Estimated: >60 mL/min (ref >60)
Glucose, Bld: 129 mg/dL — ABNORMAL HIGH (ref 70–99)
Potassium: 3.8 mmol/L (ref 3.5–5.1)
Sodium: 135 mmol/L (ref 135–145)
Total Bilirubin: 0.3 mg/dL (ref 0.0–1.2)
Total Protein: 6.7 g/dL (ref 6.5–8.1)

## 2023-08-15 LAB — LIPID PANEL
Cholesterol: 168 mg/dL (ref 0–200)
HDL: 41 mg/dL (ref >40)
LDL Cholesterol: 93 mg/dL (ref 0–99)
Total CHOL/HDL Ratio: 4.1 ratio
Triglycerides: 169 mg/dL — ABNORMAL HIGH (ref <150)
VLDL: 34 mg/dL (ref 0–40)

## 2023-08-15 LAB — RAPID URINE DRUG SCREEN, HOSP PERFORMED
Amphetamines: NOT DETECTED
Barbiturates: NOT DETECTED
Benzodiazepines: NOT DETECTED
Cocaine: NOT DETECTED
Opiates: NOT DETECTED
Tetrahydrocannabinol: NOT DETECTED

## 2023-08-15 LAB — CBC
HCT: 42.4 % (ref 39.0–52.0)
Hemoglobin: 13.6 g/dL (ref 13.0–17.0)
MCH: 30.1 pg (ref 26.0–34.0)
MCHC: 32.1 g/dL (ref 30.0–36.0)
MCV: 93.8 fL (ref 80.0–100.0)
Platelets: 298 K/uL (ref 150–400)
RBC: 4.52 MIL/uL (ref 4.22–5.81)
RDW: 13.6 % (ref 11.5–15.5)
WBC: 12.1 K/uL — ABNORMAL HIGH (ref 4.0–10.5)
nRBC: 0 % (ref 0.0–0.2)

## 2023-08-15 LAB — HIV ANTIBODY (ROUTINE TESTING W REFLEX): HIV Screen 4th Generation wRfx: NONREACTIVE

## 2023-08-15 MED ORDER — CARVEDILOL 3.125 MG PO TABS
3.1250 mg | ORAL_TABLET | Freq: Two times a day (BID) | ORAL | Status: DC
  Administered 2023-08-15: 3.125 mg via ORAL
  Filled 2023-08-15: qty 1

## 2023-08-15 MED ORDER — AMLODIPINE BESYLATE 5 MG PO TABS: 5.0000 mg | ORAL_TABLET | Freq: Every day | ORAL | Status: DC

## 2023-08-15 MED ORDER — FUROSEMIDE 10 MG/ML IJ SOLN
20.0000 mg | Freq: Once | INTRAMUSCULAR | Status: AC
  Administered 2023-08-15: 20 mg via INTRAVENOUS
  Filled 2023-08-15: qty 2

## 2023-08-15 MED ORDER — HYDROCHLOROTHIAZIDE 25 MG PO TABS: 25.0000 mg | ORAL_TABLET | Freq: Every morning | ORAL | Status: DC

## 2023-08-15 NOTE — Consult Note (Addendum)
 Cardiology Consultation   Patient ID: Henry Schmitt MRN: [id number]; DOB: [birth year]  Admit date: 08/14/2023 Date of Consult: 08/15/2023  PCP:  Henry Schmitt   [facility] Providers Cardiologist:  None        Patient Profile: Henry Schmitt is a 63 y.o. male with a hx of hypertension and tobacco use who is being seen 08/15/2023 for the evaluation of new onset HFrEF at the request of Donalda Applebaum MD.  History of Present Illness: Henry Schmitt is new to [facility], and he has no prior cardiac history besides hypertension.   On 8/5 patient had originally presented to his PCP for intermittent chest pain and a new right-sided weakness.  His PCP was concerned for a CVA and sent him to the ED.  In the ED he was hypertensive 184/95. ECG sinus rhythm with LVH with new LBBB VR 91 Troponin 21 -> 23 WBC 15.6 -> 12.1 Imaging obtained included: MRI Brain showed acute right pontine infarct CXR showed borderline cardiomegaly CTA Head/Neck showed mixed atherosclerotic plaque of right and left carotid bifurcation with 40% and 50% stenosis respectively. Moderate stenosis of the distal left cavernous ICA and mild-moderate stenosis of right cavernous ICA. Focal atherosclerosis of the proximal V2 segment with moderate stenosis. Echocardiogram showed new LVEF 30 to 35% with global hypokinesis worse in the inferior wall.  Grade 1 diastolic dysfunction.  On interview patient shared that for the last 3 weeks he had been having intermittent chest pain described as heaviness worse with exertion relieved by rest.  Has noticed episodes of chest heaviness at rest. He has no prior history of MI or coronary artery disease. Additionally he has noted that he has become more short of breath with exertion and has even found he is short of breath at rest at times to the point he begins gasping.  No other associated symptoms at that time.  He has been having PND for approximately 4 years though shared that he  was interested in a sleep study because he has daytime sleepiness as well.  No history of a OSA.  Denied lightheadedness, dizziness, palpitations, abdominal distention, and peripheral edema.  Alcohol use is less than 7 drinks a week Tobacco use half a pack a day of cigarettes for approximately 40 years He is currently not using any illicit drugs though he has a history of cocaine use for 15 years.  He quit using cocaine over 25 years ago.  Denied other illicit substances.  His family history includes a mother who died of congestive heart failure in her 80s.  Past Medical History:  Diagnosis Date   Hypertension     Past Surgical History:  Procedure Laterality Date   CHOLECYSTECTOMY         Scheduled Meds:  aspirin  EC  81 mg Oral Daily   atorvastatin   40 mg Oral Daily   clopidogrel   75 mg Oral Daily   enoxaparin  (LOVENOX ) injection  40 mg Subcutaneous Q24H   nitroGLYCERIN   0.5 inch Topical Once   Continuous Infusions:  PRN Meds: acetaminophen  **OR** acetaminophen  (TYLENOL ) oral liquid 160 mg/5 mL **OR** acetaminophen   Allergies:    Allergies  Allergen Reactions   Peanut-Containing Drug Products Swelling    ALL TREE NUTS   Antihistamines, Chlorpheniramine-Type Hives and Swelling   Lisinopril Swelling    Social History:   Social History   Socioeconomic History   Marital status: Single    Spouse name: Not on file   Number of children: Not on  file   Years of education: Not on file   Highest education level: Not on file  Occupational History   Not on file  Tobacco Use   Smoking status: Every Day    Current packs/day: 0.50    Types: Cigarettes   Smokeless tobacco: Never  Vaping Use   Vaping status: Never Used  Substance and Sexual Activity   Alcohol use: Yes    Comment: 3 times per week: brandy or beer   Drug use: Not Currently   Sexual activity: Never  Other Topics Concern   Not on file  Social History Narrative   Not on file   Social Drivers of Health    Financial Resource Strain: Low Risk  (02/12/2023)   Received from [facility]   Overall Financial Resource Strain (CARDIA)    Difficulty of Paying Living Expenses: Not very hard  Food Insecurity: No Food Insecurity (08/15/2023)   Hunger Vital Sign    Worried About Running Out of Food in the Last Year: Never true    Ran Out of Food in the Last Year: Never true  Transportation Needs: No Transportation Needs (08/15/2023)   PRAPARE - Administrator, Civil Service (Medical): No    Lack of Transportation (Non-Medical): No  Physical Activity: Unknown (02/12/2023)   Received from [facility]   Exercise Vital Sign    On average, how many days per week do you engage in moderate to strenuous exercise (like a brisk walk)?: 0 days    Minutes of Exercise per Session: Not on file  Stress: No Stress Concern Present (02/12/2023)   Received from [facility] of Occupational [facility] - Occupational Stress Questionnaire    Feeling of Stress : Only a little  Social Connections: Moderately Integrated (02/12/2023)   Received from [facility]   Social Network    How would you rate your social network (family, work, friends)?: Adequate participation with social networks  Intimate Partner Violence: Not At Risk (08/15/2023)   Humiliation, Afraid, Rape, and Kick questionnaire    Fear of Current or Ex-Partner: No    Emotionally Abused: No    Physically Abused: No    Sexually Abused: No    Family History:   Family History  Problem Relation Age of Onset   Heart failure Mother    Hypertension Mother    Diabetes Other      ROS:  Please see the history of present illness.  All other ROS reviewed and negative.     Physical Exam/Data: Vitals:   08/15/23 0431 08/15/23 0828 08/15/23 1200 08/15/23 1321  BP: (!) 148/81 (!) 153/78 (!) 155/101   Pulse: 70 65  (!) 108  Resp: 16  20   Temp: 98.5 F (36.9 C) 98.2 F (36.8 C) 98.3 F (36.8 C)   TempSrc: Oral Oral    SpO2: 97%  98%    Weight:      Height:        Intake/Output Summary (Last 24 hours) at 08/15/2023 1359 Last data filed at 08/15/2023 0310 Gross per 24 hour  Intake 309.29 ml  Output 0 ml  Net 309.29 ml      08/14/2023   10:43 AM 10/11/2022    5:49 PM 08/15/2020    7:44 PM  Last 3 Weights  Weight (lbs) 187 lb 188 lb 0.8 oz 188 lb  Weight (kg) 84.823 kg 85.3 kg 85.276 kg     Body mass index is 30.18 kg/m.  General:  Well nourished, well developed, in no acute distress HEENT: normal Neck: JVD with + HJR Vascular: No carotid bruits; Distal pulses 2+ bilaterally Cardiac:  normal S1, S2; RRR; no murmur Lungs:  clear to auscultation bilaterally, no wheezing, rhonchi or rales  Abd: Distended though compressible, non tender Ext: no edema Musculoskeletal:  No deformities Skin: warm and dry  Neuro:  CNs 2-12 intact, no focal abnormalities noted Psych:  Normal affect   EKG:  The EKG was personally reviewed and demonstrates: See HPI. Telemetry:  Telemetry was personally reviewed and demonstrates: Sinus rhythm with intermittent sinus rhythm with LBBB most likely rate related HR 70-100, occasional PVC ectopy  Laboratory Data: High Sensitivity Troponin:   Recent Labs  Lab 08/14/23 1046 08/14/23 1246  TROPONINIHS 21* 23*     Chemistry Recent Labs  Lab 08/14/23 1046 08/15/23 0559  NA 134* 135  K 3.9 3.8  CL 99 101  CO2 24 26  GLUCOSE 114* 129*  BUN 19 16  CREATININE 0.86 0.98  CALCIUM  9.4 9.1  GFRNONAA >60 >60  ANIONGAP 11 8    Recent Labs  Lab 08/15/23 0559  PROT 6.7  ALBUMIN 3.3*  AST 24  ALT 27  ALKPHOS 97  BILITOT 0.3   Lipids  Recent Labs  Lab 08/15/23 0559  CHOL 168  TRIG 169*  HDL 41  LDLCALC 93  CHOLHDL 4.1    Hematology Recent Labs  Lab 08/14/23 1046 08/15/23 0559  WBC 15.6* 12.1*  RBC 4.73 4.52  HGB 14.3 13.6  HCT 44.7 42.4  MCV 94.5 93.8  MCH 30.2 30.1  MCHC 32.0 32.1  RDW 13.6 13.6  PLT 339 298   Thyroid No results for input(s): TSH, FREET4 in the  last 168 hours.  BNPNo results for input(s): BNP, PROBNP in the last 168 hours.  DDimer No results for input(s): DDIMER in the last 168 hours.  Radiology/Studies:  CT ANGIO HEAD NECK W WO CM Result Date: 08/14/2023 EXAM: CTA HEAD AND NECK WITHOUT AND WITH 08/14/2023 07:23:00 PM TECHNIQUE: CTA of the head and neck was performed without and with the administration of intravenous contrast. Multiplanar 2D and/or 3D reformatted images are provided for review. Automated exposure control, iterative reconstruction, and/or weight based adjustment of the mA/kV was utilized to reduce the radiation dose to as low as reasonably achievable. Stenosis of the internal carotid arteries measured using NASCET criteria. COMPARISON: MRI head earlier same day and CT head on 10/31/2022. CLINICAL HISTORY: 63 year old male with past medical history of hypertension and hyperlipidemia presenting to the emergency department with chest pressure, right-sided weakness, and dizziness. Concern for possible CVA. FINDINGS: CTA NECK: AORTIC ARCH AND ARCH VESSELS: Mild atherosclerosis of the aortic arch. Common origin of the brachiocephalic and left common carotid arteries. Mild atherosclerosis of the bilateral subclavian arteries without high-grade stenosis. CERVICAL CAROTID ARTERIES: Mixed atherosclerotic plaque at the right carotid bifurcation resulting in approximately 40% stenosis at the origin of the right cervical ICA. Additional mixed atherosclerotic plaque at the left carotid bifurcation resulting in approximately 50% stenosis at the origin of the left cervical ICA. CERVICAL VERTEBRAL ARTERIES: Patent from the origins to the vertebrobasilar confluence. Tortuosity of the right V1 segment. Mild focal atherosclerosis of the proximal left V2 segment resulting in moderate stenosis. LUNGS AND MEDIASTINUM: Unremarkable. SOFT TISSUES: No acute abnormality. BONES: No acute abnormality. CTA HEAD: ANTERIOR CIRCULATION: Prominent calcified  atherosclerosis of the carotid siphons. Moderate stenosis of the distal left cavernous ICA and additional mild-to-moderate stenosis of the right cavernous  ICA. The MCAs are patent bilaterally. The ACAs are patent bilaterally. POSTERIOR CIRCULATION: No significant stenosis of the posterior cerebral arteries. No significant stenosis of the basilar artery. No significant stenosis of the vertebral arteries. No aneurysm. OTHER: Region of infarct in the right pons noted on the same day MRI is not well visualized on the current CT. No dural venous sinus thrombosis on this non-dedicated study. IMPRESSION: 1. No large vessel occlusion in the head or neck. 2. Mixed atherosclerotic plaque at the right carotid bifurcation resulting in approximately 40% stenosis at the origin of the right cervical ICA. 3. Mixed atherosclerotic plaque at the left carotid bifurcation resulting in approximately 50% stenosis at the origin of the left cervical ICA. 4. Moderate stenosis of the distal left cavernous ICA and additional mild-to-moderate stenosis of the right cavernous ICA. 5. Focal atherosclerosis of the proximal left V2 segment resulting in moderate stenosis. Electronically signed by: Donnice Mania MD 08/14/2023 08:46 PM EDT RP Workstation: [id number]   ECHOCARDIOGRAM COMPLETE Result Date: 08/14/2023    ECHOCARDIOGRAM REPORT   Patient Name:   Henry Schmitt Date of Exam: 08/14/2023 Medical Rec #:  [id number]    Height:       66.0 in Accession #:    [id number]   Weight:       187.0 lb Date of Birth:  Mar 04, 1960   BSA:          1.943 m Patient Age:    62 years     BP:           151/86 mmHg Patient Gender: M            HR:           67 bpm. Exam Location:  Inpatient Procedure: 2D Echo (Both Spectral and Color Flow Doppler were utilized during            procedure). Indications:    stroke  History:        Patient has prior history of Echocardiogram examinations, most                 recent 04/19/2015. Arrythmias:LBBB; Risk Factors:Current  Smoker                 and Hypertension.  Sonographer:    Tinnie Barefoot RDCS Referring Phys: [id number] JOETTE PEBBLES IMPRESSIONS  1. Global hypokinesis worse in the inferior wall . Left ventricular ejection fraction, by estimation, is 30 to 35%. The left ventricle has moderately decreased function. The left ventricle demonstrates global hypokinesis. The left ventricular internal cavity size was mildly dilated. There is mild left ventricular hypertrophy. Left ventricular diastolic parameters are consistent with Grade I diastolic dysfunction (impaired relaxation).  2. Right ventricular systolic function is normal. The right ventricular size is normal.  3. The mitral valve is abnormal. No evidence of mitral valve regurgitation. No evidence of mitral stenosis.  4. The aortic valve is tricuspid. There is mild calcification of the aortic valve. There is mild thickening of the aortic valve. Aortic valve regurgitation is not visualized. Aortic valve sclerosis is present, with no evidence of aortic valve stenosis.  5. The inferior vena cava is normal in size with greater than 50% respiratory variability, suggesting right atrial pressure of 3 mmHg. FINDINGS  Left Ventricle: Global hypokinesis worse in the inferior wall. Left ventricular ejection fraction, by estimation, is 30 to 35%. The left ventricle has moderately decreased function. The left ventricle demonstrates global hypokinesis. Strain was performed and the global longitudinal strain is  indeterminate. The left ventricular internal cavity size was mildly dilated. There is mild left ventricular hypertrophy. Left ventricular diastolic parameters are consistent with Grade I diastolic dysfunction (impaired relaxation). Right Ventricle: The right ventricular size is normal. No increase in right ventricular wall thickness. Right ventricular systolic function is normal. Left Atrium: Left atrial size was normal in size. Right Atrium: Right atrial size was normal in size.  Pericardium: There is no evidence of pericardial effusion. Mitral Valve: The mitral valve is abnormal. There is mild thickening of the mitral valve leaflet(s). No evidence of mitral valve regurgitation. No evidence of mitral valve stenosis. Tricuspid Valve: The tricuspid valve is normal in structure. Tricuspid valve regurgitation is trivial. No evidence of tricuspid stenosis. Aortic Valve: The aortic valve is tricuspid. There is mild calcification of the aortic valve. There is mild thickening of the aortic valve. Aortic valve regurgitation is not visualized. Aortic valve sclerosis is present, with no evidence of aortic valve stenosis. Pulmonic Valve: The pulmonic valve was normal in structure. Pulmonic valve regurgitation is trivial. No evidence of pulmonic stenosis. Aorta: The aortic root is normal in size and structure. Venous: The inferior vena cava is normal in size with greater than 50% respiratory variability, suggesting right atrial pressure of 3 mmHg. IAS/Shunts: No atrial level shunt detected by color flow Doppler. Additional Comments: 3D was performed not requiring image post processing on an independent workstation and was indeterminate.  LEFT VENTRICLE PLAX 2D LVIDd:         4.80 cm     Diastology LVIDs:         4.10 cm     LV e' medial:    4.35 cm/s LV PW:         1.20 cm     LV E/e' medial:  12.6 LV IVS:        1.30 cm     LV e' lateral:   4.68 cm/s LVOT diam:     2.10 cm     LV E/e' lateral: 11.7 LV SV:         48 LV SV Index:   25 LVOT Area:     3.46 cm  LV Volumes (MOD) LV vol d, MOD A2C: 97.6 ml LV vol d, MOD A4C: 88.6 ml LV vol s, MOD A2C: 64.4 ml LV vol s, MOD A4C: 59.1 ml LV SV MOD A2C:     33.2 ml LV SV MOD A4C:     88.6 ml LV SV MOD BP:      31.8 ml RIGHT VENTRICLE             IVC RV Basal diam:  2.50 cm     IVC diam: 1.30 cm RV S prime:     11.70 cm/s TAPSE (M-mode): 1.6 cm LEFT ATRIUM             Index        RIGHT ATRIUM          Index LA diam:        3.30 cm 1.70 cm/m   RA Area:     9.55  cm LA Vol (A2C):   28.4 ml 14.61 ml/m  RA Volume:   17.40 ml 8.95 ml/m LA Vol (A4C):   26.9 ml 13.84 ml/m LA Biplane Vol: 28.1 ml 14.46 ml/m  AORTIC VALVE LVOT Vmax:   76.50 cm/s LVOT Vmean:  46.000 cm/s LVOT VTI:    0.140 m  AORTA Ao Root diam: 2.90 cm Ao Asc diam:  3.00 cm MITRAL VALVE MV Area (PHT): 3.65 cm    SHUNTS MV Decel Time: 208 msec    Systemic VTI:  0.14 m MV E velocity: 54.70 cm/s  Systemic Diam: 2.10 cm MV A velocity: 91.00 cm/s MV E/A ratio:  0.60 Maude Emmer MD Electronically signed by Maude Emmer MD Signature Date/Time: 08/14/2023/6:09:55 PM    Final    DG Chest Port 1 View Result Date: 08/14/2023 EXAM: 1 VIEW XRAY OF THE CHEST 08/14/2023 12:38:00 PM COMPARISON: 08/15/2020 CLINICAL HISTORY: CP. Reason for exam: CP; Triage notes: Pt BIB EMS from PCP office with CC of CP with HTN x 3 weeks. Pt also reported right sided weakness with alterations in balance and gait as a result x 6 days. Aox4. FINDINGS: LUNGS AND PLEURA: No focal pulmonary opacity. No pulmonary edema. No pleural effusion. No pneumothorax. HEART AND MEDIASTINUM: Borderline cardiomegaly. No acute abnormality of the mediastinal silhouette. BONES AND SOFT TISSUES: No acute osseous abnormality. Multiple wires and leads project over the chest on the frontal radiograph. IMPRESSION: 1. No acute findings. 2. Borderline cardiomegaly. Electronically signed by: Rockey Kilts MD 08/14/2023 01:43 PM EDT RP Workstation: [id number]   MR BRAIN WO CONTRAST Result Date: 08/14/2023 CLINICAL DATA:  Right-sided weakness with balance alteration EXAM: MRI HEAD WITHOUT CONTRAST TECHNIQUE: Multiplanar, multiecho pulse sequences of the brain and surrounding structures were obtained without intravenous contrast. COMPARISON:  April 09, 2014 FINDINGS: MRI brain: There is acute infarct in the right side of the pons. There are several foci of T2 hyperintensity in the cerebral white matter. These do not have restricted diffusion. The ventricles are normal. No  mass lesion. There are normal flow signals in the carotid arteries and basilar artery. No significant bone marrow signal abnormality. No significant abnormality in the paranasal sinuses or soft tissues. IMPRESSION: Acute right pontine infarct Electronically Signed   By: Nancyann Burns M.D.   On: 08/14/2023 11:53     Assessment and Plan: New onset HFrEF Patient shared that he has been experiencing progressive shortness of breath and chest pain on exertion ECG with new left bundle branch Troponins indicate demand ischemia  Echocardiogram shows LVEF 30-35% with global hypokinesis worse in the inferior wall. Mild dilation to LV with mild LVH. Grade I Diastolic dysfunction.  No I/O's recorded or daily weights this admission. On exam patient appears volume overloaded.  Suspect patient's etiology is ischemic given anginal chest pain, new left bundle branch, and tobacco use, though he does have a history of hypertension and remote history of cocaine use [>25 years ago]. Patient will eventually need a RHC/LHC for further evaluation of the etiology of his HFrEF, however patient with acute CVA. Typically would like to avoid RHC/LHC in this acute period of stoke. However given concerns for possible unstable angina given progression of his angina we may pursue RHC/LHC later this week.   -Strict I/Os -give one dose of IV lasix  20 mg -start coreg  3.125 mg BID Will hold on aggressive GDMT as patient is still in the window of permissive hypertension. Neurology would like systolic blood pressure for now to between 120-160.   Hypertension Recent CVA, permissive hypertension for one week per neurology PTA medications amlodipine  2.5 mg and Hydrochlorothiazide  25 mg BP: 155/101  Would not discharge patient on home PTA medications, will start GDMT instead.  -start coreg  3.125 mg BID  Hyperlipidemia Goal < 70 LDL 93    HDL 41   TG 169 Continue lipitor 40 mg  CVA Patient presented with right sided  weakness.  MRI showed right pontine CVA. Neurology following.  Neurology would like systolic blood pressure for now to between 120-160.  Continue ASA 81 mg Continue Plavix  75 mg Continue lipitor 40 mg  Bilateral carotid stenosis New diagnosis this admission. Will continue to monitor. Patient currently on antiplatelet and statin as listed above.  Tobacco Use Patient has used about half a pack a day for 40 years Educated on cessation.    Risk Assessment/Risk Scores:      [place]  Heart Association (NYHA) Functional Class NYHA Class II/III   For questions or updates, please contact [facility] Please consult [email or web address] for contact info under    Signed, Leontine LOISE Salen, PA-C  08/15/2023 1:59 PM   I have seen and examined the patient along with Leontine LOISE Salen, PA-C .  I have reviewed the chart, notes and new data.  I agree with PA/NP's note.  Key new complaints: neurological symptoms have improved rapidly and are almost back to baseline. No dyspnea at rest. HF symptoms with activity date back at least a month. Key examination changes: normal CV exam, Neuro exam seems nonfocal Key new findings / data: LBBB is clearly rate related, coming and going. Currently narrow QRS complex with HR in low 70s. During echo, had LBBB and I thinks this is why it was interpreted as showing regional wall motion abnormalities. I believe he has global hypokinesis. Moderate atherosclerosis is seen in the aortic arch, great arch vessels and the distal abdominal aorta (coronaries not included on any studies).  PLAN: He will need a coronary angiogram to clarify the cause of his cardiomyopathy. Since he has recovered so well from CVA, I would recommend doing this before DC, this coming Friday 08/17/2023. Will plan Rand L cath to also optimize volume status assessment. Had a long discussion regarding dietary sodium restriction, weight monitoring, signs and symptoms of HF exacerbation. Also reviewed the  complex medical regimen used for chronic HF mgmt. Plan to replace amlodipine  and hydrochlorothiazide  with carvedilol , spironolactone  and Entresto (the latter two meds after the angiogram) and add SGLT2 inhibitor. Gradually introduce new meds to avoid dropping BP too fast after CVA.  Jerel Balding, MD, FACC [facility] [phone] 08/15/2023, 5:26 PM

## 2023-08-15 NOTE — TOC Initial Note (Signed)
 Transition of Care [facility]) - Initial/Assessment Note    Patient Details  Name: Henry Schmitt MRN: [id number] Date of Birth: 1960/01/16  Transition of Care [facility]) CM/SW Contact:    Marval Gell, RN Phone Number: 08/15/2023, 4:27 PM  Clinical Narrative:                  Beatris w patient and discussed DC plan. He would like OP therapy and referral has been made and added to AVS. RW will be delivered to the room via [employer] tomorrow morning Expected Discharge Plan: Home/Self Care Barriers to Discharge: Continued Medical Work up   Patient Goals and CMS Choice Patient states their goals for this hospitalization and ongoing recovery are:: to go home CMS Medicare.gov Compare Post Acute Care list provided to:: Patient Choice offered to / list presented to : Patient      Expected Discharge Plan and Services   Discharge Planning Services: CM Consult   Living arrangements for the past 2 months: Apartment                 DME Arranged: Vannie rolling DME Agency: [facility] Date DME Agency Contacted: 08/15/23 Time DME Agency Contacted: [phone] Representative spoke with at DME Agency: Ryan            Prior Living Arrangements/Services Living arrangements for the past 2 months: Apartment                     Activities of Daily Living   ADL Screening (condition at time of admission) Independently performs ADLs?: Yes (appropriate for developmental age) Is the patient deaf or have difficulty hearing?: No Does the patient have difficulty seeing, even when wearing glasses/contacts?: No Does the patient have difficulty concentrating, remembering, or making decisions?: No  Permission Sought/Granted                  Emotional Assessment              Admission diagnosis:  Acute CVA (cerebrovascular accident) [facility]) [I63.9] Patient Active Problem List   Diagnosis Date Noted   Acute CVA (cerebrovascular accident) (HCC) 08/14/2023   LBBB (left bundle branch block)  08/14/2023   Smoker 02/12/2023   Influenza A 02/03/2016   Hypokalemia 02/03/2016   Bronchitis 02/03/2016   Anemia 02/03/2016   Aortitis (HCC) 04/18/2015   Abdominal pain 04/18/2015   Essential hypertension, benign 04/18/2015   Leukocytosis 05/07/2014   Hypogonadotropic hypogonadism (HCC) 04/06/2014   Gynecomastia, male 03/18/2014   PCP:  [name], PA-C Pharmacy:   [facility] [address], [place] - [address] [place] [address] Phone: [phone] Fax: [phone]  [facility] [address], [place] - [address] [place] [address] Phone: [phone] Fax: [phone]  [facility] [address] [place] [address] Phone: [phone] Fax: [phone]     Social Drivers of Health (SDOH) Social History: SDOH Screenings   Food Insecurity: No Food Insecurity (08/15/2023)  Housing: Low Risk  (08/15/2023)  Transportation Needs: No Transportation Needs (08/15/2023)  Utilities: Not At Risk (08/15/2023)  Financial Resource Strain: Low Risk  (02/12/2023)   Received from [facility]  Physical Activity: Unknown (02/12/2023)   Received from [facility]  Social Connections: Moderately Integrated (02/12/2023)   Received from [facility]  Stress: No Stress Concern Present (02/12/2023)   Received from [facility]  Tobacco Use: High Risk (08/14/2023)   SDOH  Interventions:     Readmission Risk Interventions     No data to display

## 2023-08-15 NOTE — Progress Notes (Addendum)
 STROKE TEAM PROGRESS NOTE   INTERVAL HISTORY Patient seen sitting in bed. Mostly in good spirits. Awaiting cardiology consult. Educated patient on smoking cessation and decreasing ETOH intake. Patient verbalized an understanding.   Vitals:   08/15/23 0431 08/15/23 0828 08/15/23 1200 08/15/23 1321  BP: (!) 148/81 (!) 153/78 (!) 155/101   Pulse: 70 65  (!) 108  Resp: 16  20   Temp: 98.5 F (36.9 C) 98.2 F (36.8 C) 98.3 F (36.8 C)   TempSrc: Oral Oral    SpO2: 97%  98%   Weight:      Height:       CBC:  Recent Labs  Lab 08/14/23 1046 08/15/23 0559  WBC 15.6* 12.1*  HGB 14.3 13.6  HCT 44.7 42.4  MCV 94.5 93.8  PLT 339 298   Basic Metabolic Panel:  Recent Labs  Lab 08/14/23 1046 08/15/23 0559  NA 134* 135  K 3.9 3.8  CL 99 101  CO2 24 26  GLUCOSE 114* 129*  BUN 19 16  CREATININE 0.86 0.98  CALCIUM  9.4 9.1   Lipid Panel:  Recent Labs  Lab 08/15/23 0559  CHOL 168  TRIG 169*  HDL 41  CHOLHDL 4.1  VLDL 34  LDLCALC 93   HgbA1c: No results for input(s): HGBA1C in the last 168 hours. Urine Drug Screen:  Recent Labs  Lab 08/14/23 1235  LABOPIA NONE DETECTED  COCAINSCRNUR NONE DETECTED  LABBENZ NONE DETECTED  AMPHETMU NONE DETECTED  THCU NONE DETECTED  LABBARB NONE DETECTED    Alcohol Level No results for input(s): ETH in the last 168 hours.  IMAGING past 24 hours CT ANGIO HEAD NECK W WO CM Result Date: 08/14/2023 EXAM: CTA HEAD AND NECK WITHOUT AND WITH 08/14/2023 07:23:00 PM TECHNIQUE: CTA of the head and neck was performed without and with the administration of intravenous contrast. Multiplanar 2D and/or 3D reformatted images are provided for review. Automated exposure control, iterative reconstruction, and/or weight based adjustment of the mA/kV was utilized to reduce the radiation dose to as low as reasonably achievable. Stenosis of the internal carotid arteries measured using NASCET criteria. COMPARISON: MRI head earlier same day and CT head on  10/31/2022. CLINICAL HISTORY: 63 year old male with past medical history of hypertension and hyperlipidemia presenting to the emergency department with chest pressure, right-sided weakness, and dizziness. Concern for possible CVA. FINDINGS: CTA NECK: AORTIC ARCH AND ARCH VESSELS: Mild atherosclerosis of the aortic arch. Common origin of the brachiocephalic and left common carotid arteries. Mild atherosclerosis of the bilateral subclavian arteries without high-grade stenosis. CERVICAL CAROTID ARTERIES: Mixed atherosclerotic plaque at the right carotid bifurcation resulting in approximately 40% stenosis at the origin of the right cervical ICA. Additional mixed atherosclerotic plaque at the left carotid bifurcation resulting in approximately 50% stenosis at the origin of the left cervical ICA. CERVICAL VERTEBRAL ARTERIES: Patent from the origins to the vertebrobasilar confluence. Tortuosity of the right V1 segment. Mild focal atherosclerosis of the proximal left V2 segment resulting in moderate stenosis. LUNGS AND MEDIASTINUM: Unremarkable. SOFT TISSUES: No acute abnormality. BONES: No acute abnormality. CTA HEAD: ANTERIOR CIRCULATION: Prominent calcified atherosclerosis of the carotid siphons. Moderate stenosis of the distal left cavernous ICA and additional mild-to-moderate stenosis of the right cavernous ICA. The MCAs are patent bilaterally. The ACAs are patent bilaterally. POSTERIOR CIRCULATION: No significant stenosis of the posterior cerebral arteries. No significant stenosis of the basilar artery. No significant stenosis of the vertebral arteries. No aneurysm. OTHER: Region of infarct in the right pons noted  on the same day MRI is not well visualized on the current CT. No dural venous sinus thrombosis on this non-dedicated study. IMPRESSION: 1. No large vessel occlusion in the head or neck. 2. Mixed atherosclerotic plaque at the right carotid bifurcation resulting in approximately 40% stenosis at the origin of  the right cervical ICA. 3. Mixed atherosclerotic plaque at the left carotid bifurcation resulting in approximately 50% stenosis at the origin of the left cervical ICA. 4. Moderate stenosis of the distal left cavernous ICA and additional mild-to-moderate stenosis of the right cavernous ICA. 5. Focal atherosclerosis of the proximal left V2 segment resulting in moderate stenosis. Electronically signed by: Donnice Mania MD 08/14/2023 08:46 PM EDT RP Workstation: [id number]   ECHOCARDIOGRAM COMPLETE Result Date: 08/14/2023    ECHOCARDIOGRAM REPORT   Patient Name:   Henry Schmitt Date of Exam: 08/14/2023 Medical Rec #:  [id number]    Height:       66.0 in Accession #:    [id number]   Weight:       187.0 lb Date of Birth:  August 26, 1960   BSA:          1.943 m Patient Age:    62 years     BP:           151/86 mmHg Patient Gender: M            HR:           67 bpm. Exam Location:  Inpatient Procedure: 2D Echo (Both Spectral and Color Flow Doppler were utilized during            procedure). Indications:    stroke  History:        Patient has prior history of Echocardiogram examinations, most                 recent 04/19/2015. Arrythmias:LBBB; Risk Factors:Current Smoker                 and Hypertension.  Sonographer:    Tinnie Barefoot RDCS Referring Phys: [id number] JOETTE PEBBLES IMPRESSIONS  1. Global hypokinesis worse in the inferior wall . Left ventricular ejection fraction, by estimation, is 30 to 35%. The left ventricle has moderately decreased function. The left ventricle demonstrates global hypokinesis. The left ventricular internal cavity size was mildly dilated. There is mild left ventricular hypertrophy. Left ventricular diastolic parameters are consistent with Grade I diastolic dysfunction (impaired relaxation).  2. Right ventricular systolic function is normal. The right ventricular size is normal.  3. The mitral valve is abnormal. No evidence of mitral valve regurgitation. No evidence of mitral stenosis.  4. The aortic  valve is tricuspid. There is mild calcification of the aortic valve. There is mild thickening of the aortic valve. Aortic valve regurgitation is not visualized. Aortic valve sclerosis is present, with no evidence of aortic valve stenosis.  5. The inferior vena cava is normal in size with greater than 50% respiratory variability, suggesting right atrial pressure of 3 mmHg. FINDINGS  Left Ventricle: Global hypokinesis worse in the inferior wall. Left ventricular ejection fraction, by estimation, is 30 to 35%. The left ventricle has moderately decreased function. The left ventricle demonstrates global hypokinesis. Strain was performed and the global longitudinal strain is indeterminate. The left ventricular internal cavity size was mildly dilated. There is mild left ventricular hypertrophy. Left ventricular diastolic parameters are consistent with Grade I diastolic dysfunction (impaired relaxation). Right Ventricle: The right ventricular size is normal. No increase in right ventricular wall thickness. Right  ventricular systolic function is normal. Left Atrium: Left atrial size was normal in size. Right Atrium: Right atrial size was normal in size. Pericardium: There is no evidence of pericardial effusion. Mitral Valve: The mitral valve is abnormal. There is mild thickening of the mitral valve leaflet(s). No evidence of mitral valve regurgitation. No evidence of mitral valve stenosis. Tricuspid Valve: The tricuspid valve is normal in structure. Tricuspid valve regurgitation is trivial. No evidence of tricuspid stenosis. Aortic Valve: The aortic valve is tricuspid. There is mild calcification of the aortic valve. There is mild thickening of the aortic valve. Aortic valve regurgitation is not visualized. Aortic valve sclerosis is present, with no evidence of aortic valve stenosis. Pulmonic Valve: The pulmonic valve was normal in structure. Pulmonic valve regurgitation is trivial. No evidence of pulmonic stenosis. Aorta:  The aortic root is normal in size and structure. Venous: The inferior vena cava is normal in size with greater than 50% respiratory variability, suggesting right atrial pressure of 3 mmHg. IAS/Shunts: No atrial level shunt detected by color flow Doppler. Additional Comments: 3D was performed not requiring image post processing on an independent workstation and was indeterminate.  LEFT VENTRICLE PLAX 2D LVIDd:         4.80 cm     Diastology LVIDs:         4.10 cm     LV e' medial:    4.35 cm/s LV PW:         1.20 cm     LV E/e' medial:  12.6 LV IVS:        1.30 cm     LV e' lateral:   4.68 cm/s LVOT diam:     2.10 cm     LV E/e' lateral: 11.7 LV SV:         48 LV SV Index:   25 LVOT Area:     3.46 cm  LV Volumes (MOD) LV vol d, MOD A2C: 97.6 ml LV vol d, MOD A4C: 88.6 ml LV vol s, MOD A2C: 64.4 ml LV vol s, MOD A4C: 59.1 ml LV SV MOD A2C:     33.2 ml LV SV MOD A4C:     88.6 ml LV SV MOD BP:      31.8 ml RIGHT VENTRICLE             IVC RV Basal diam:  2.50 cm     IVC diam: 1.30 cm RV S prime:     11.70 cm/s TAPSE (M-mode): 1.6 cm LEFT ATRIUM             Index        RIGHT ATRIUM          Index LA diam:        3.30 cm 1.70 cm/m   RA Area:     9.55 cm LA Vol (A2C):   28.4 ml 14.61 ml/m  RA Volume:   17.40 ml 8.95 ml/m LA Vol (A4C):   26.9 ml 13.84 ml/m LA Biplane Vol: 28.1 ml 14.46 ml/m  AORTIC VALVE LVOT Vmax:   76.50 cm/s LVOT Vmean:  46.000 cm/s LVOT VTI:    0.140 m  AORTA Ao Root diam: 2.90 cm Ao Asc diam:  3.00 cm MITRAL VALVE MV Area (PHT): 3.65 cm    SHUNTS MV Decel Time: 208 msec    Systemic VTI:  0.14 m MV E velocity: 54.70 cm/s  Systemic Diam: 2.10 cm MV A velocity: 91.00 cm/s MV E/A ratio:  0.60  Henry Emmer MD Electronically signed by Henry Emmer MD Signature Date/Time: 08/14/2023/6:09:55 PM    Final     PHYSICAL EXAM Physical Exam  Constitutional: Appears well-developed and well-nourished.  Psych: Affect appropriate to situation Eyes: Normal external eye and conjunctiva. HENT: Normocephalic,  no lesions, without obvious abnormality.   Musculoskeletal-no joint tenderness, deformity or swelling Cardiovascular: Normal rate and regular rhythm.  Respiratory: Effort normal, non-labored breathing saturations WNL GI: Soft.  No distension. There is no tenderness.  Skin: WDI   Neuro:  Mental Status: Alert, oriented, thought content appropriate.  Speech fluent without evidence of aphasia.  Able to follow commands without difficulty. Cranial Nerves: PP:Cpdljo fields grossly normal,  III,IV, VI: ptosis not present, extra-ocular motions intact bilaterally pupils equal, round, reactive to light and accommodation V,VII: smile symmetric, facial light touch sensation normal bilaterally VIII: hearing normal bilaterally IX,X: uvula rises symmetrically XI: bilateral shoulder shrug XII: midline tongue extension Motor: Right : Upper extremity   4/5  Left:     Upper extremity   5/5  Lower extremity   4/5   Lower extremity   5/5 Tone and bulk:normal tone throughout; no atrophy noted Sensory: Pinprick and light touch intact throughout, bilaterally Cerebellar: No ataxia noted Gait: not tested   ASSESSMENT/PLAN Henry Schmitt is a 63 y.o. male with hx of hypertension presenting to the emergency department on 8/5 after seeing his PCP and reporting endorses headache, right sided weakness and double vision starting Thursday.  He was instructed to go to the emergency department by his PCP for a stroke workup.  He does report some difficulty walking due to the right-sided weakness.  Double vision has improved. he additionally reported chest pressure. This is   Stroke: acute right pontine infarct, etiology:  likely small vessel disease.   Patient presented with diplopia and right-sided weakness with difficulty walking.  Diplopia symptoms fits to the stroke but R sided weakness did not fit to right pontine infarct but no other possible explanation at the time.  CT head No acute abnormality. CTA head & neck  . No large vessel occlusion.  Right ICA 40% stenosed, left ICA 50% stenosis, bilateral ICA siphon moderate stenosis, left V2 moderate stenosis. MRI  Acute right pontine infarct  2D Echo EF 30-35%, no atrial shunts LDL 93 HgbA1c Pending UDS negative VTE prophylaxis - lovenox  No antithrombotic prior to admission, now on aspirin  81 mg daily and clopidogrel  75 mg daily for 3 weeks and then aspirin  alone. Therapy recommendations: Outpatient PT Disposition: Pending  Cardiomyopathy 2017 EF 60 to 65% This admission EF 30 to 35% Cardiology consulted Patient is out of permissive hypertension window, okay to start GDMT if needed  Hypertension Home meds:  amlodipine  2.5mg , hydrochlorothiazide  25mg  daily Stable on the high end On Coreg  3.125 twice daily, HCTZ 25 Out of permissive hypertension window Long-term BP goal normotensive  Hyperlipidemia Home meds:  none  LDL 93, goal < 70 Add atorvastatin  40mg  daily  Continue statin at discharge  Tobacco abuse Current smoker Smoking cessation counseling provided Pt is willing to quit  Other Stroke Risk Factors ETOH use, advised to drink no more than 1 drink a day Obesity, Body mass index is 30.18 kg/m., BMI >/= 30 associated with increased stroke risk, recommend weight loss, diet and exercise as appropriate   Other Active Problems Leukocytosis WBC 15.6  Hospital day # 0  Neurology will sign off as work up is complete.  Harlene Pouch, MSN, NP-C [facility] See AMION or use Epic  Chat  ATTENDING NOTE: I reviewed above note and agree with the assessment and plan. Pt was seen and examined.   No family bedside.  Patient lying in bed, AO x 3, no aphasia, follows simple commands.  Neuroexam showed slight left lower extremity weakness.  However, MRI showed right pontine infarct which may explain his diplopia but hard to explain right-sided weakness.  But no alternative examination at this time.  Still considered small vessel  disease, continue DAPT and statin.  Smoking cessation educated.  Alcohol limitation education provided.  PT and OT recommend outpatient therapy.  For detailed assessment and plan, please refer to above as I have made changes wherever appropriate.   Neurology will sign off. Please call with questions. Pt will follow up with stroke clinic NP at [facility] in about 4 weeks. Thanks for the consult.   Ary Cummins, MD PhD Stroke Neurology 08/15/2023 4:43 PM   To contact Stroke Continuity provider, please refer to [email or web address]. After hours, contact General Neurology

## 2023-08-15 NOTE — Evaluation (Signed)
 Speech Language Pathology Evaluation Patient Details Name: Henry Schmitt MRN: [id number] DOB: 06-01-1960 Today's Date: 08/15/2023 Time: 9094-9072 SLP Time Calculation (min) (ACUTE ONLY): 22 min  Problem List:  Patient Active Problem List   Diagnosis Date Noted   Acute CVA (cerebrovascular accident) (HCC) 08/14/2023   LBBB (left bundle branch block) 08/14/2023   Smoker 02/12/2023   Influenza A 02/03/2016   Hypokalemia 02/03/2016   Bronchitis 02/03/2016   Anemia 02/03/2016   Aortitis (HCC) 04/18/2015   Abdominal pain 04/18/2015   Essential hypertension, benign 04/18/2015   Leukocytosis 05/07/2014   Hypogonadotropic hypogonadism (HCC) 04/06/2014   Gynecomastia, male 03/18/2014   Past Medical History:  Past Medical History:  Diagnosis Date   Hypertension    Past Surgical History:  Past Surgical History:  Procedure Laterality Date   CHOLECYSTECTOMY     HPI:  63 yo male adm to [facility] with right leg weakness/discoordination. Found to have a right pontine cva- Pt with PMH + for HLD, HTN and uses tobacco products.  Speech eval ordered as part of stroke work up.   Assessment / Plan / Recommendation Clinical Impression  [facility] Mental Status Exam conducted with pt scoring 27/30 - WFL.  He benefited from category cue to recall 1/5 words - but recalled others independently.  Also had some difficulty placing hands on his clock, but drew the clock correctly. Reports recently requiring reading glasses to read anything when previously he needed them to read small print only.  Pt with fluent speech and language and intact cognition.  No dysarthria nor cranial nerve deficits noted re: speech.  Educated pt to findings/recommendations.  No SLP follow up needed. Thanks for this consult.    SLP Assessment  SLP Recommendation/Assessment: Patient does not need any further Speech Language Pathology Services SLP Visit Diagnosis: Cognitive communication deficit (R41.841)     Assistance  Recommended at Discharge  None  Functional Status Assessment Patient has not had a recent decline in their functional status  Frequency and Duration           SLP Evaluation Cognition  Overall Cognitive Status: Within Functional Limits for tasks assessed Arousal/Alertness: Awake/alert Orientation Level: Oriented X4 Year: 2025 Month: August Day of Week: Correct Attention: Sustained Sustained Attention: Appears intact Memory: Appears intact Awareness: Appears intact Problem Solving: Appears intact Safety/Judgment: Appears intact       Comprehension  Auditory Comprehension Overall Auditory Comprehension: Appears within functional limits for tasks assessed Yes/No Questions: Not tested Commands: Within Functional Limits Conversation: Complex Visual Recognition/Discrimination Discrimination: Not tested Reading Comprehension Reading Status: Within funtional limits    Expression Expression Primary Mode of Expression: Verbal Verbal Expression Overall Verbal Expression: Appears within functional limits for tasks assessed Initiation: No impairment Level of Generative/Spontaneous Verbalization: Conversation Repetition:  (DNT) Naming: Not tested Pragmatics: No impairment Written Expression Dominant Hand: Right Written Expression: Within Functional Limits   Oral / Motor  Oral Motor/Sensory Function Overall Oral Motor/Sensory Function: Within functional limits Motor Speech Overall Motor Speech: Appears within functional limits for tasks assessed Respiration: Within functional limits Resonance: Within functional limits Articulation: Within functional limitis Intelligibility: Intelligible Motor Planning: Not tested Motor Speech Errors: Not applicable            Nicolas Emmie Caldron 08/15/2023, 9:43 AM Madelin POUR, MS [facility] SLP Acute Rehab Services Office [phone]

## 2023-08-15 NOTE — Plan of Care (Signed)
  Problem: Education: Goal: Knowledge of disease or condition will improve Outcome: Progressing   Problem: Ischemic Stroke/TIA Tissue Perfusion: Goal: Complications of ischemic stroke/TIA will be minimized Outcome: Progressing   Problem: Coping: Goal: Will verbalize positive feelings about self Outcome: Progressing   Problem: Self-Care: Goal: Ability to participate in self-care as condition permits will improve Outcome: Progressing   Problem: Nutrition: Goal: Risk of aspiration will decrease Outcome: Progressing   Problem: Education: Goal: Knowledge of General Education information will improve Description: Including pain rating scale, medication(s)/side effects and non-pharmacologic comfort measures Outcome: Progressing

## 2023-08-15 NOTE — Evaluation (Signed)
 Occupational Therapy Evaluation Patient Details Name: Henry Schmitt MRN: [id number] DOB: Feb 11, 1960 Today's Date: 08/15/2023   History of Present Illness   Patient is a 63 y.o. male who presented with unsteady gait/right-sided weakness-found to have acute CVA. Past history of HTN, tobacco use.     Clinical Impressions Patient is currently requiring as high as Stand by assistance with basic ADLs, and up to CGA assist with functional transfers to stand with use of RW and cues to use.   Current level of function is below patient's typical baseline.    During this evaluation, patient was limited by RT hemibody weakness and mildly decreased coordination, impaired activity tolerance, and decreased awareness for safety and fall prevention following a CVA, all of which has the potential to impact patient's and/or caregivers' safety and independence during functional mobility, as well as performance for ADLs.    Patient lives alone with friends/neighbors/family who are able to provide PRN assistance.  Patient demonstrates good rehab potential, and should benefit from continued skilled occupational therapy services while in acute care to maximize safety, independence and quality of life at home.  Np post- acute OT needs anticipated.  Counseled pt on not resuming driving until medically cleared to do so though doctor and to check with DMV given weakness and ataxia of RT pedal foot.    ?      If plan is discharge home, recommend the following:   Assistance with cooking/housework;Assist for transportation;A little help with bathing/dressing/bathroom     Functional Status Assessment   Patient has had a recent decline in their functional status and demonstrates the ability to make significant improvements in function in a reasonable and predictable amount of time.     Equipment Recommendations    (Grab bar for tub/shower and RW)     Recommendations for Other Services          Precautions/Restrictions   Precautions Precautions: Fall Recall of Precautions/Restrictions: Intact Restrictions Weight Bearing Restrictions Per Provider Order: No     Mobility Bed Mobility Overal bed mobility: Independent                  Transfers   Equipment used: Rolling walker (2 wheels) Transfers: Sit to/from Stand, Bed to chair/wheelchair/BSC Sit to Stand: Supervision                  Balance Overall balance assessment: Needs assistance Sitting-balance support: Bilateral upper extremity supported, Feet supported Sitting balance-Leahy Scale: Good     Standing balance support: Bilateral upper extremity supported, No upper extremity supported Standing balance-Leahy Scale: Poor                             ADL either performed or assessed with clinical judgement   ADL Overall ADL's : Needs assistance/impaired Eating/Feeding: Set up Eating/Feeding Details (indicate cue type and reason): May need light setup assist with tight containers. Grooming: Wash/dry hands;Wash/dry face;Standing;Supervision/safety Grooming Details (indicate cue type and reason): Pt shown how to integrate RUE WB on vanity while performing grooming with LT hand to allow for RUE WB while switching hands for task to also allow RUE to work on functional ADLs. Upper Body Bathing: Supervision/ safety;Standing   Lower Body Bathing: Supervison/ safety;Sit to/from stand Lower Body Bathing Details (indicate cue type and reason): Performed to peri area and upper legs but not feet. Upper Body Dressing : Set up;Sitting   Lower Body Dressing: Sit to/from stand Lower Body Dressing  Details (indicate cue type and reason): Pt did not follow cues to sit for doffing/donning underwear. Pt doffed underwear over hips while standing then shuffled with underwear around ankles to sink to complete LB bathing. Pt quickly learned forward to grab underwear and came up again to hold door frame stating,  I did that too fast. After standing rest, pt able to slowly grab underwear to don with close supervision. Reinforced recommendation to sit when dressing over feet. Toilet Transfer: Supervision/safety;Rolling walker (2 wheels);Cueing for safety   Toileting- Clothing Manipulation and Hygiene: Modified independent       Functional mobility during ADLs: Supervision/safety;Cueing for safety;Rolling walker (2 wheels) (Not used to RW and will leave it behind. Needs cues to use.)       Vision Baseline Vision/History: 0 No visual deficits Ability to See in Adequate Light: 0 Adequate Patient Visual Report: No change from baseline Additional Comments: Reading glasses at home. Pt reports visual disturbances are resolved.     Perception Perception: Within Functional Limits       Praxis Praxis: WFL       Pertinent Vitals/Pain Pain Assessment Pain Assessment: No/denies pain     Extremity/Trunk Assessment Upper Extremity Assessment Upper Extremity Assessment: Right hand dominant;RUE deficits/detail;LUE deficits/detail RUE Deficits / Details: WNL ROM, no downward drift on 10 sec, 90 degree shoulder test. Grossly 4/5 MMT RUE Sensation: WNL RUE Coordination: decreased fine motor (slowed rate, accurate targeting.) LUE Deficits / Details: WNL with 5/5 MMT LUE Sensation: WNL LUE Coordination: WNL   Lower Extremity Assessment Lower Extremity Assessment: Defer to PT evaluation RLE Deficits / Details: Ataxia with MMT   Cervical / Trunk Assessment Cervical / Trunk Assessment: Normal   Communication Communication Communication: No apparent difficulties   Cognition Arousal: Alert Behavior During Therapy: WFL for tasks assessed/performed Cognition: No apparent impairments             OT - Cognition Comments: Some decreased awareness of deficits and decreased understanding of why things like RW are needed at this time despite RT knee buckling today on him. Benefits from education on  this.                 Following commands: Intact       Cueing  General Comments   Cueing Techniques: Verbal cues;Tactile cues      Exercises Other Exercises Other Exercises: Pt provided with handouts on [facility] activities as UE Theraband exercises w/ green (med-heavy) resistance band provided. Pt demonstrated understanding of exercises x 1 set while in recliner. Ed on WB to RUE including wall plank, progressing to floor plank.   Shoulder Instructions      Home Living                                          Prior Functioning/Environment                      OT Problem List: Decreased strength;Decreased knowledge of use of DME or AE;Decreased coordination;Decreased activity tolerance;Cardiopulmonary status limiting activity;Impaired UE functional use;Decreased safety awareness;Impaired balance (sitting and/or standing)   OT Treatment/Interventions: Neuromuscular education;Therapeutic activities;DME and/or AE instruction;Patient/family education;Therapeutic exercise;Balance training;Self-care/ADL training      OT Goals(Current goals can be found in the care plan section)   Acute Rehab OT Goals Patient Stated Goal: Return to baseline independence OT Goal Formulation: With patient Time For Goal Achievement:  08/29/23 Potential to Achieve Goals: Good ADL Goals Pt Will Perform Tub/Shower Transfer: ambulating;with modified independence Pt/caregiver will Perform Home Exercise Program: Increased strength;Right Upper extremity;With written HEP provided;Independently;With theraband (and increased dexterity) Additional ADL Goal #1: Pt will score at least a score of 14 with sit <> stand reps from chair or EOB for 30 second Sit to stand test, modified for use of UEs if needed for pt safety, with VSS and no lightheadedness, to demosntrate low fall risk and improved tolerance to activity to allow baseline functional at home. Additional ADL Goal #2: Pt will  demonstrate at least 14.9 +/- 2.2, per standardized norms for sex and age, on standing functional reach test to demonstrate improved functional standing balance and safety awareness needed for IADLs at home.   OT Frequency:  Min 2X/week (Anticipate 1-2 more OT visits.)    Co-evaluation              AM-PAC OT 6 Clicks Daily Activity     Outcome Measure Help from another person eating meals?: None Help from another person taking care of personal grooming?: None Help from another person toileting, which includes using toliet, bedpan, or urinal?: A Little Help from another person bathing (including washing, rinsing, drying)?: A Little Help from another person to put on and taking off regular upper body clothing?: None Help from another person to put on and taking off regular lower body clothing?: A Little 6 Click Score: 21   End of Session Equipment Utilized During Treatment: Rolling walker (2 wheels) Nurse Communication: Other (comment) (RN into room while OT present.)  Activity Tolerance: Patient tolerated treatment well Patient left: in bed;with call bell/phone within reach  OT Visit Diagnosis: Unsteadiness on feet (R26.81);Ataxia, unspecified (R27.0);Hemiplegia and hemiparesis;Dizziness and giddiness (R42) Hemiplegia - Right/Left: Right Hemiplegia - dominant/non-dominant: Dominant Hemiplegia - caused by: Cerebral infarction                Time: 8963-8852 OT Time Calculation (min): 71 min Charges:  OT General Charges $OT Visit: 1 Visit OT Evaluation $OT Eval Moderate Complexity: 1 Mod OT Treatments $Self Care/Home Management : 8-22 mins $Therapeutic Activity: 8-22 mins $Neuromuscular Re-education: 8-22 mins $Therapeutic Exercise: 8-22 mins  Delon, OT Acute Rehab Services Office: [phone] 08/15/2023   Delon Falter 08/15/2023, 1:42 PM

## 2023-08-15 NOTE — Plan of Care (Signed)

## 2023-08-15 NOTE — Evaluation (Signed)
 Physical Therapy Evaluation Patient Details Name: Henry Schmitt MRN: [id number] DOB: 1960-08-30 Today's Date: 08/15/2023  History of Present Illness  Patient is a 63 y.o. male who presented with unsteady gait/right-sided weakness-found to have acute CVA. Past history of HTN, tobacco use.  Clinical Impression  Pt presents with admitting diagnosis above. Pt today was able to ambulate in hallway with RW CGA. Upon initial sit to stand pt required Mod A due to RLE buckling therefore pt was given RW for balance. 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 Recommend OP neuro PT upon DC. Patient needs to practice stairs next session as he lives on the second floor of an apartment. PT will continue to follow.             If plan is discharge home, recommend the following: A little help with walking and/or transfers;A little help with bathing/dressing/bathroom;Assistance with cooking/housework;Assist for transportation;Help with stairs or ramp for entrance;Supervision due to cognitive status   Can travel by private vehicle        Equipment Recommendations Rolling walker (2 wheels)  Recommendations for Other Services       Functional Status Assessment Patient has had a recent decline in their functional status and demonstrates the ability to make significant improvements in function in a reasonable and predictable amount of time.     Precautions / Restrictions Precautions Precautions: Fall Recall of Precautions/Restrictions: Intact Restrictions Weight Bearing Restrictions Per Provider Order: No      Mobility  Bed Mobility Overal bed mobility: Independent                  Transfers Overall transfer level: Needs assistance Equipment used: None, Rolling walker (2 wheels) Transfers: Sit to/from Stand Sit to Stand: Mod assist, Contact guard assist           General transfer comment: Mod A to stand with no AD due to RLE buckling upon standing. Pt  given RW to which pt was able to stand with CGA.    Ambulation/Gait Ambulation/Gait assistance: Contact guard assist Gait Distance (Feet): 400 Feet Assistive device: Rolling walker (2 wheels) Gait Pattern/deviations: Trunk flexed, Decreased stride length, Step-through pattern Gait velocity: decreased     General Gait Details: Cues for proximity RW. no LOB noted.  Stairs            Wheelchair Mobility     Tilt Bed    Modified Rankin (Stroke Patients Only)       Balance Overall balance assessment: Needs assistance Sitting-balance support: Bilateral upper extremity supported, Feet supported Sitting balance-Leahy Scale: Good     Standing balance support: Bilateral upper extremity supported, No upper extremity supported Standing balance-Leahy Scale: Poor Standing balance comment: Reliant on RW                             Pertinent Vitals/Pain Pain Assessment Pain Assessment: No/denies pain    Home Living Family/patient expects to be discharged to:: Private residence Living Arrangements: Alone Available Help at Discharge: Family;Friend(s);Neighbor;Available PRN/intermittently Type of Home: Apartment Home Access: Stairs to enter Entrance Stairs-Rails: Can reach both Entrance Stairs-Number of Steps: 17   Home Layout: One level Home Equipment: None      Prior Function Prior Level of Function : Independent/Modified Independent;Working/employed;Driving             Mobility Comments: Ind ADLs Comments: Ind     Extremity/Trunk Assessment   Upper  Extremity Assessment Upper Extremity Assessment: Overall WFL for tasks assessed    Lower Extremity Assessment Lower Extremity Assessment: RLE deficits/detail RLE Deficits / Details: Ataxia with MMT    Cervical / Trunk Assessment Cervical / Trunk Assessment: Normal  Communication   Communication Communication: No apparent difficulties    Cognition Arousal: Alert Behavior During Therapy: WFL  for tasks assessed/performed   PT - Cognitive impairments: No apparent impairments                         Following commands: Intact       Cueing Cueing Techniques: Verbal cues, Tactile cues     General Comments General comments (skin integrity, edema, etc.): VSS    Exercises     Assessment/Plan    PT Assessment Patient needs continued PT services  PT Problem List Decreased strength;Decreased range of motion;Decreased activity tolerance;Decreased balance;Decreased mobility;Decreased coordination;Decreased knowledge of use of DME;Decreased safety awareness;Decreased knowledge of precautions;Cardiopulmonary status limiting activity       PT Treatment Interventions DME instruction;Gait training;Stair training;Functional mobility training;Therapeutic activities;Therapeutic exercise;Balance training;Neuromuscular re-education;Patient/family education    PT Goals (Current goals can be found in the Care Plan section)  Acute Rehab PT Goals Patient Stated Goal: to go home PT Goal Formulation: With patient Time For Goal Achievement: 08/29/23 Potential to Achieve Goals: Good    Frequency Min 2X/week     Co-evaluation               AM-PAC PT 6 Clicks Mobility  Outcome Measure Help needed turning from your back to your side while in a flat bed without using bedrails?: None Help needed moving from lying on your back to sitting on the side of a flat bed without using bedrails?: None Help needed moving to and from a bed to a chair (including a wheelchair)?: None Help needed standing up from a chair using your arms (e.g., wheelchair or bedside chair)?: A Lot Help needed to walk in hospital room?: A Little Help needed climbing 3-5 steps with a railing? : A Lot 6 Click Score: 19    End of Session Equipment Utilized During Treatment: Gait belt Activity Tolerance: Patient tolerated treatment well Patient left: in bed;with call bell/phone within reach Nurse  Communication: Mobility status PT Visit Diagnosis: Other abnormalities of gait and mobility (R26.89)    Time: 9053-8998 PT Time Calculation (min) (ACUTE ONLY): 15 min   Charges:   PT Evaluation $PT Eval Moderate Complexity: 1 Mod   PT General Charges $$ ACUTE PT VISIT: 1 Visit         Sueellen NOVAK, PT, DPT Acute Rehab Services [phone]   Babe Clenney 08/15/2023, 11:17 AM

## 2023-08-15 NOTE — Progress Notes (Signed)
 PROGRESS NOTE        PATIENT DETAILS Name: Henry Schmitt Age: 63 y.o. Sex: male Date of Birth: 19-Aug-1960 Admit Date: 08/14/2023 Admitting Physician Joette Pebbles, MD ERE:Ryjefjw, Hoy CROME, PA-C  Brief Summary: Patient is a 63 y.o.  male with history of HTN, tobacco use-who presented with unsteady gait/right-sided weakness-found to have acute CVA  Significant events: 8/5>> admit to [facility]  Significant studies: 8/5>> MRI brain: Acute right pontine infarct 8/5>> CTA head/neck: No LVO, 40% stenosis at the origin of the right cervical ICA, 50% stenosis origin of left cervical ICA.  Moderate stenosis left cavernous ICA. 8/5>> echo: EF 30-35%. 8/6>> LDL: 93 8/6>> A1c: Pending.  Significant microbiology data: None  Procedures: None  Consults: Neurology Cardiology.  Subjective: Lying comfortably in bed-denies any chest pain or shortness of breath.  Right-sided symptoms that he first presented with have essentially resolved.  No diplopia.  Objective: Vitals: Blood pressure (!) 153/78, pulse 65, temperature 98.2 F (36.8 C), temperature source Oral, resp. rate 16, height 5' 6 (1.676 m), weight 84.8 kg, SpO2 97%.   Exam: Gen Exam:Alert awake-not in any distress HEENT:atraumatic, normocephalic Chest: B/L clear to auscultation anteriorly CVS:S1S2 regular Abdomen:soft non tender, non distended Extremities:no edema Neurology: Non focal Skin: no rash  Pertinent Labs/Radiology:    Latest Ref Rng & Units 08/15/2023    5:59 AM 08/14/2023   10:46 AM 10/11/2022    6:00 PM  CBC  WBC 4.0 - 10.5 K/uL 12.1  15.6  15.1   Hemoglobin 13.0 - 17.0 g/dL 86.3  85.6  86.3   Hematocrit 39.0 - 52.0 % 42.4  44.7  43.0   Platelets 150 - 400 K/uL 298  339  344     Lab Results  Component Value Date   NA 135 08/15/2023   K 3.8 08/15/2023   CL 101 08/15/2023   CO2 26 08/15/2023      Assessment/Plan: Acute right pontine infarct Presenting symptoms have essentially  resolved Probably secondary to small vessel disease. Workup as above On aspirin /Plavix /statin Await PT/OT eval Await further recommendations from stroke MD  B/L carotid artery stenosis Incidental finding Probably asymptomatic In any event on aspirin /Plavix /statin Needs outpatient follow-up with vascular surgery for close monitoring.  New diagnosis of HFrEF Volume status stable-denies SOB with exertion Cardiology consulted Await further recommendations  HTN Permissive hypertension being allowed  Tobacco abuse Counseled  EtOH use Drinks brandy/beer 3 times weekly No evidence of withdrawal symptoms currently.  Class 1 Obesity: Estimated body mass index is 30.18 kg/m as calculated from the following:   Height as of this encounter: 5' 6 (1.676 m).   Weight as of this encounter: 84.8 kg.   Code status:   Code Status: Full Code   DVT Prophylaxis: enoxaparin  (LOVENOX ) injection 40 mg Start: 08/14/23 1600   Family Communication: None at bedside   Disposition Plan: Status is: Observation The patient remains OBS appropriate and will d/c before 2 midnights.   Planned Discharge Destination:Home   Diet: Diet Order             Diet Heart Fluid consistency: Thin  Diet effective now                     Antimicrobial agents: Anti-infectives (From admission, onward)    None        MEDICATIONS: Scheduled Meds:  aspirin  EC  81 mg Oral Daily   atorvastatin   40 mg Oral Daily   clopidogrel   75 mg Oral Daily   enoxaparin  (LOVENOX ) injection  40 mg Subcutaneous Q24H   nitroGLYCERIN   0.5 inch Topical Once   Continuous Infusions:  sodium chloride  Stopped (08/14/23 2126)   PRN Meds:.acetaminophen  **OR** acetaminophen  (TYLENOL ) oral liquid 160 mg/5 mL **OR** acetaminophen    I have personally reviewed following labs and imaging studies  LABORATORY DATA: CBC: Recent Labs  Lab 08/14/23 1046 08/15/23 0559  WBC 15.6* 12.1*  HGB 14.3 13.6  HCT 44.7 42.4   MCV 94.5 93.8  PLT 339 298    Basic Metabolic Panel: Recent Labs  Lab 08/14/23 1046 08/15/23 0559  NA 134* 135  K 3.9 3.8  CL 99 101  CO2 24 26  GLUCOSE 114* 129*  BUN 19 16  CREATININE 0.86 0.98  CALCIUM  9.4 9.1    GFR: Estimated Creatinine Clearance: 78.8 mL/min (by C-G formula based on SCr of 0.98 mg/dL).  Liver Function Tests: Recent Labs  Lab 08/15/23 0559  AST 24  ALT 27  ALKPHOS 97  BILITOT 0.3  PROT 6.7  ALBUMIN 3.3*   No results for input(s): LIPASE, AMYLASE in the last 168 hours. No results for input(s): AMMONIA in the last 168 hours.  Coagulation Profile: No results for input(s): INR, PROTIME in the last 168 hours.  Cardiac Enzymes: No results for input(s): CKTOTAL, CKMB, CKMBINDEX, TROPONINI in the last 168 hours.  BNP (last 3 results) No results for input(s): PROBNP in the last 8760 hours.  Lipid Profile: Recent Labs    08/15/23 0559  CHOL 168  HDL 41  LDLCALC 93  TRIG 169*  CHOLHDL 4.1    Thyroid Function Tests: No results for input(s): TSH, T4TOTAL, FREET4, T3FREE, THYROIDAB in the last 72 hours.  Anemia Panel: No results for input(s): VITAMINB12, FOLATE, FERRITIN, TIBC, IRON, RETICCTPCT in the last 72 hours.  Urine analysis:    Component Value Date/Time   COLORURINE YELLOW 10/31/2019 0920   APPEARANCEUR CLEAR 10/31/2019 0920   LABSPEC 1.018 10/31/2019 0920   PHURINE 5.0 10/31/2019 0920   GLUCOSEU NEGATIVE 10/31/2019 0920   HGBUR NEGATIVE 10/31/2019 0920   BILIRUBINUR NEGATIVE 10/31/2019 0920   KETONESUR NEGATIVE 10/31/2019 0920   PROTEINUR NEGATIVE 10/31/2019 0920   UROBILINOGEN 0.2 02/10/2008 0229   NITRITE NEGATIVE 10/31/2019 0920   LEUKOCYTESUR SMALL (A) 10/31/2019 0920    Sepsis Labs: Lactic Acid, Venous    Component Value Date/Time   LATICACIDVEN 1.43 02/03/2016 2127    MICROBIOLOGY: No results found for this or any previous visit (from the past 240  hours).  RADIOLOGY STUDIES/RESULTS: CT ANGIO HEAD NECK W WO CM Result Date: 08/14/2023 EXAM: CTA HEAD AND NECK WITHOUT AND WITH 08/14/2023 07:23:00 PM TECHNIQUE: CTA of the head and neck was performed without and with the administration of intravenous contrast. Multiplanar 2D and/or 3D reformatted images are provided for review. Automated exposure control, iterative reconstruction, and/or weight based adjustment of the mA/kV was utilized to reduce the radiation dose to as low as reasonably achievable. Stenosis of the internal carotid arteries measured using NASCET criteria. COMPARISON: MRI head earlier same day and CT head on 10/31/2022. CLINICAL HISTORY: 63 year old male with past medical history of hypertension and hyperlipidemia presenting to the emergency department with chest pressure, right-sided weakness, and dizziness. Concern for possible CVA. FINDINGS: CTA NECK: AORTIC ARCH AND ARCH VESSELS: Mild atherosclerosis of the aortic arch. Common origin of the brachiocephalic and left common  carotid arteries. Mild atherosclerosis of the bilateral subclavian arteries without high-grade stenosis. CERVICAL CAROTID ARTERIES: Mixed atherosclerotic plaque at the right carotid bifurcation resulting in approximately 40% stenosis at the origin of the right cervical ICA. Additional mixed atherosclerotic plaque at the left carotid bifurcation resulting in approximately 50% stenosis at the origin of the left cervical ICA. CERVICAL VERTEBRAL ARTERIES: Patent from the origins to the vertebrobasilar confluence. Tortuosity of the right V1 segment. Mild focal atherosclerosis of the proximal left V2 segment resulting in moderate stenosis. LUNGS AND MEDIASTINUM: Unremarkable. SOFT TISSUES: No acute abnormality. BONES: No acute abnormality. CTA HEAD: ANTERIOR CIRCULATION: Prominent calcified atherosclerosis of the carotid siphons. Moderate stenosis of the distal left cavernous ICA and additional mild-to-moderate stenosis of the  right cavernous ICA. The MCAs are patent bilaterally. The ACAs are patent bilaterally. POSTERIOR CIRCULATION: No significant stenosis of the posterior cerebral arteries. No significant stenosis of the basilar artery. No significant stenosis of the vertebral arteries. No aneurysm. OTHER: Region of infarct in the right pons noted on the same day MRI is not well visualized on the current CT. No dural venous sinus thrombosis on this non-dedicated study. IMPRESSION: 1. No large vessel occlusion in the head or neck. 2. Mixed atherosclerotic plaque at the right carotid bifurcation resulting in approximately 40% stenosis at the origin of the right cervical ICA. 3. Mixed atherosclerotic plaque at the left carotid bifurcation resulting in approximately 50% stenosis at the origin of the left cervical ICA. 4. Moderate stenosis of the distal left cavernous ICA and additional mild-to-moderate stenosis of the right cavernous ICA. 5. Focal atherosclerosis of the proximal left V2 segment resulting in moderate stenosis. Electronically signed by: Donnice Mania MD 08/14/2023 08:46 PM EDT RP Workstation: [id number]   ECHOCARDIOGRAM COMPLETE Result Date: 08/14/2023    ECHOCARDIOGRAM REPORT   Patient Name:   Henry Schmitt Date of Exam: 08/14/2023 Medical Rec #:  [id number]    Height:       66.0 in Accession #:    [id number]   Weight:       187.0 lb Date of Birth:  1960/03/12   BSA:          1.943 m Patient Age:    62 years     BP:           151/86 mmHg Patient Gender: M            HR:           67 bpm. Exam Location:  Inpatient Procedure: 2D Echo (Both Spectral and Color Flow Doppler were utilized during            procedure). Indications:    stroke  History:        Patient has prior history of Echocardiogram examinations, most                 recent 04/19/2015. Arrythmias:LBBB; Risk Factors:Current Smoker                 and Hypertension.  Sonographer:    Tinnie Barefoot RDCS Referring Phys: [id number] JOETTE PEBBLES IMPRESSIONS  1. Global  hypokinesis worse in the inferior wall . Left ventricular ejection fraction, by estimation, is 30 to 35%. The left ventricle has moderately decreased function. The left ventricle demonstrates global hypokinesis. The left ventricular internal cavity size was mildly dilated. There is mild left ventricular hypertrophy. Left ventricular diastolic parameters are consistent with Grade I diastolic dysfunction (impaired relaxation).  2. Right ventricular systolic function is normal. The right  ventricular size is normal.  3. The mitral valve is abnormal. No evidence of mitral valve regurgitation. No evidence of mitral stenosis.  4. The aortic valve is tricuspid. There is mild calcification of the aortic valve. There is mild thickening of the aortic valve. Aortic valve regurgitation is not visualized. Aortic valve sclerosis is present, with no evidence of aortic valve stenosis.  5. The inferior vena cava is normal in size with greater than 50% respiratory variability, suggesting right atrial pressure of 3 mmHg. FINDINGS  Left Ventricle: Global hypokinesis worse in the inferior wall. Left ventricular ejection fraction, by estimation, is 30 to 35%. The left ventricle has moderately decreased function. The left ventricle demonstrates global hypokinesis. Strain was performed and the global longitudinal strain is indeterminate. The left ventricular internal cavity size was mildly dilated. There is mild left ventricular hypertrophy. Left ventricular diastolic parameters are consistent with Grade I diastolic dysfunction (impaired relaxation). Right Ventricle: The right ventricular size is normal. No increase in right ventricular wall thickness. Right ventricular systolic function is normal. Left Atrium: Left atrial size was normal in size. Right Atrium: Right atrial size was normal in size. Pericardium: There is no evidence of pericardial effusion. Mitral Valve: The mitral valve is abnormal. There is mild thickening of the mitral  valve leaflet(s). No evidence of mitral valve regurgitation. No evidence of mitral valve stenosis. Tricuspid Valve: The tricuspid valve is normal in structure. Tricuspid valve regurgitation is trivial. No evidence of tricuspid stenosis. Aortic Valve: The aortic valve is tricuspid. There is mild calcification of the aortic valve. There is mild thickening of the aortic valve. Aortic valve regurgitation is not visualized. Aortic valve sclerosis is present, with no evidence of aortic valve stenosis. Pulmonic Valve: The pulmonic valve was normal in structure. Pulmonic valve regurgitation is trivial. No evidence of pulmonic stenosis. Aorta: The aortic root is normal in size and structure. Venous: The inferior vena cava is normal in size with greater than 50% respiratory variability, suggesting right atrial pressure of 3 mmHg. IAS/Shunts: No atrial level shunt detected by color flow Doppler. Additional Comments: 3D was performed not requiring image post processing on an independent workstation and was indeterminate.  LEFT VENTRICLE PLAX 2D LVIDd:         4.80 cm     Diastology LVIDs:         4.10 cm     LV e' medial:    4.35 cm/s LV PW:         1.20 cm     LV E/e' medial:  12.6 LV IVS:        1.30 cm     LV e' lateral:   4.68 cm/s LVOT diam:     2.10 cm     LV E/e' lateral: 11.7 LV SV:         48 LV SV Index:   25 LVOT Area:     3.46 cm  LV Volumes (MOD) LV vol d, MOD A2C: 97.6 ml LV vol d, MOD A4C: 88.6 ml LV vol s, MOD A2C: 64.4 ml LV vol s, MOD A4C: 59.1 ml LV SV MOD A2C:     33.2 ml LV SV MOD A4C:     88.6 ml LV SV MOD BP:      31.8 ml RIGHT VENTRICLE             IVC RV Basal diam:  2.50 cm     IVC diam: 1.30 cm RV S prime:     11.70  cm/s TAPSE (M-mode): 1.6 cm LEFT ATRIUM             Index        RIGHT ATRIUM          Index LA diam:        3.30 cm 1.70 cm/m   RA Area:     9.55 cm LA Vol (A2C):   28.4 ml 14.61 ml/m  RA Volume:   17.40 ml 8.95 ml/m LA Vol (A4C):   26.9 ml 13.84 ml/m LA Biplane Vol: 28.1 ml 14.46  ml/m  AORTIC VALVE LVOT Vmax:   76.50 cm/s LVOT Vmean:  46.000 cm/s LVOT VTI:    0.140 m  AORTA Ao Root diam: 2.90 cm Ao Asc diam:  3.00 cm MITRAL VALVE MV Area (PHT): 3.65 cm    SHUNTS MV Decel Time: 208 msec    Systemic VTI:  0.14 m MV E velocity: 54.70 cm/s  Systemic Diam: 2.10 cm MV A velocity: 91.00 cm/s MV E/A ratio:  0.60 Maude Emmer MD Electronically signed by Maude Emmer MD Signature Date/Time: 08/14/2023/6:09:55 PM    Final    DG Chest Port 1 View Result Date: 08/14/2023 EXAM: 1 VIEW XRAY OF THE CHEST 08/14/2023 12:38:00 PM COMPARISON: 08/15/2020 CLINICAL HISTORY: CP. Reason for exam: CP; Triage notes: Pt BIB EMS from PCP office with CC of CP with HTN x 3 weeks. Pt also reported right sided weakness with alterations in balance and gait as a result x 6 days. Aox4. FINDINGS: LUNGS AND PLEURA: No focal pulmonary opacity. No pulmonary edema. No pleural effusion. No pneumothorax. HEART AND MEDIASTINUM: Borderline cardiomegaly. No acute abnormality of the mediastinal silhouette. BONES AND SOFT TISSUES: No acute osseous abnormality. Multiple wires and leads project over the chest on the frontal radiograph. IMPRESSION: 1. No acute findings. 2. Borderline cardiomegaly. Electronically signed by: Rockey Kilts MD 08/14/2023 01:43 PM EDT RP Workstation: [id number]   MR BRAIN WO CONTRAST Result Date: 08/14/2023 CLINICAL DATA:  Right-sided weakness with balance alteration EXAM: MRI HEAD WITHOUT CONTRAST TECHNIQUE: Multiplanar, multiecho pulse sequences of the brain and surrounding structures were obtained without intravenous contrast. COMPARISON:  April 09, 2014 FINDINGS: MRI brain: There is acute infarct in the right side of the pons. There are several foci of T2 hyperintensity in the cerebral white matter. These do not have restricted diffusion. The ventricles are normal. No mass lesion. There are normal flow signals in the carotid arteries and basilar artery. No significant bone marrow signal abnormality. No  significant abnormality in the paranasal sinuses or soft tissues. IMPRESSION: Acute right pontine infarct Electronically Signed   By: Nancyann Burns M.D.   On: 08/14/2023 11:53     LOS: 0 days   Donalda Applebaum, MD  [facility]    To contact the attending provider between 7A-7P or the covering provider during after hours 7P-7A, please log into the web site [email or web address] and access using universal [place] password for that web site. If you do not have the password, please call the hospital operator.  08/15/2023, 10:10 AM

## 2023-08-16 DIAGNOSIS — I5021 Acute systolic (congestive) heart failure: Secondary | ICD-10-CM | POA: Diagnosis present

## 2023-08-16 DIAGNOSIS — I110 Hypertensive heart disease with heart failure: Secondary | ICD-10-CM | POA: Diagnosis present

## 2023-08-16 DIAGNOSIS — I63541 Cerebral infarction due to unspecified occlusion or stenosis of right cerebellar artery: Secondary | ICD-10-CM | POA: Diagnosis not present

## 2023-08-16 DIAGNOSIS — I639 Cerebral infarction, unspecified: Secondary | ICD-10-CM | POA: Diagnosis not present

## 2023-08-16 DIAGNOSIS — F17203 Nicotine dependence unspecified, with withdrawal: Secondary | ICD-10-CM

## 2023-08-16 DIAGNOSIS — I25110 Atherosclerotic heart disease of native coronary artery with unstable angina pectoris: Secondary | ICD-10-CM | POA: Diagnosis present

## 2023-08-16 DIAGNOSIS — I200 Unstable angina: Secondary | ICD-10-CM | POA: Diagnosis not present

## 2023-08-16 DIAGNOSIS — L02215 Cutaneous abscess of perineum: Secondary | ICD-10-CM | POA: Diagnosis present

## 2023-08-16 DIAGNOSIS — Z91010 Allergy to peanuts: Secondary | ICD-10-CM | POA: Diagnosis not present

## 2023-08-16 DIAGNOSIS — I6329 Cerebral infarction due to unspecified occlusion or stenosis of other precerebral arteries: Secondary | ICD-10-CM | POA: Diagnosis present

## 2023-08-16 DIAGNOSIS — I429 Cardiomyopathy, unspecified: Secondary | ICD-10-CM | POA: Diagnosis present

## 2023-08-16 DIAGNOSIS — G8191 Hemiplegia, unspecified affecting right dominant side: Secondary | ICD-10-CM | POA: Diagnosis present

## 2023-08-16 DIAGNOSIS — R29701 NIHSS score 1: Secondary | ICD-10-CM | POA: Diagnosis not present

## 2023-08-16 DIAGNOSIS — Z833 Family history of diabetes mellitus: Secondary | ICD-10-CM | POA: Diagnosis not present

## 2023-08-16 DIAGNOSIS — I2510 Atherosclerotic heart disease of native coronary artery without angina pectoris: Secondary | ICD-10-CM | POA: Diagnosis not present

## 2023-08-16 DIAGNOSIS — Z8249 Family history of ischemic heart disease and other diseases of the circulatory system: Secondary | ICD-10-CM | POA: Diagnosis not present

## 2023-08-16 DIAGNOSIS — Z716 Tobacco abuse counseling: Secondary | ICD-10-CM | POA: Diagnosis not present

## 2023-08-16 DIAGNOSIS — Z888 Allergy status to other drugs, medicaments and biological substances status: Secondary | ICD-10-CM | POA: Diagnosis not present

## 2023-08-16 DIAGNOSIS — I447 Left bundle-branch block, unspecified: Secondary | ICD-10-CM | POA: Diagnosis not present

## 2023-08-16 DIAGNOSIS — D72829 Elevated white blood cell count, unspecified: Secondary | ICD-10-CM | POA: Diagnosis present

## 2023-08-16 DIAGNOSIS — E785 Hyperlipidemia, unspecified: Secondary | ICD-10-CM | POA: Diagnosis present

## 2023-08-16 DIAGNOSIS — I2489 Other forms of acute ischemic heart disease: Secondary | ICD-10-CM | POA: Diagnosis present

## 2023-08-16 DIAGNOSIS — E66811 Obesity, class 1: Secondary | ICD-10-CM | POA: Diagnosis present

## 2023-08-16 DIAGNOSIS — Z91018 Allergy to other foods: Secondary | ICD-10-CM | POA: Diagnosis not present

## 2023-08-16 DIAGNOSIS — Z79899 Other long term (current) drug therapy: Secondary | ICD-10-CM | POA: Diagnosis not present

## 2023-08-16 DIAGNOSIS — F1020 Alcohol dependence, uncomplicated: Secondary | ICD-10-CM

## 2023-08-16 DIAGNOSIS — Z6830 Body mass index (BMI) 30.0-30.9, adult: Secondary | ICD-10-CM | POA: Diagnosis not present

## 2023-08-16 DIAGNOSIS — F17210 Nicotine dependence, cigarettes, uncomplicated: Secondary | ICD-10-CM | POA: Diagnosis present

## 2023-08-16 DIAGNOSIS — I6523 Occlusion and stenosis of bilateral carotid arteries: Secondary | ICD-10-CM

## 2023-08-16 DIAGNOSIS — I10 Essential (primary) hypertension: Secondary | ICD-10-CM | POA: Diagnosis not present

## 2023-08-16 DIAGNOSIS — Z634 Disappearance and death of family member: Secondary | ICD-10-CM | POA: Diagnosis not present

## 2023-08-16 DIAGNOSIS — R29700 NIHSS score 0: Secondary | ICD-10-CM | POA: Diagnosis present

## 2023-08-16 LAB — HEMOGLOBIN A1C
Hgb A1c MFr Bld: 5.3 % (ref 4.8–5.6)
Mean Plasma Glucose: 105 mg/dL

## 2023-08-16 MED ORDER — SPIRONOLACTONE 25 MG PO TABS
25.0000 mg | ORAL_TABLET | Freq: Every day | ORAL | Status: DC
  Administered 2023-08-16 – 2023-08-17 (×2): 25 mg via ORAL
  Filled 2023-08-16 (×2): qty 1

## 2023-08-16 MED ORDER — CARVEDILOL 6.25 MG PO TABS
6.2500 mg | ORAL_TABLET | Freq: Two times a day (BID) | ORAL | Status: DC
  Administered 2023-08-16 – 2023-08-17 (×2): 6.25 mg via ORAL
  Filled 2023-08-16 (×2): qty 1

## 2023-08-16 MED ORDER — FUROSEMIDE 10 MG/ML IJ SOLN
40.0000 mg | Freq: Once | INTRAMUSCULAR | Status: AC
  Administered 2023-08-16: 40 mg via INTRAVENOUS
  Filled 2023-08-16: qty 4

## 2023-08-16 MED ORDER — FREE WATER
250.0000 mL | Freq: Once | Status: AC
  Administered 2023-08-17: 250 mL via ORAL

## 2023-08-16 NOTE — Evaluation (Signed)
 Occupational Therapy Evaluation Patient Details Name: Henry Schmitt MRN: [id number] DOB: [birth year] Today's Date: 08/16/2023   History of Present Illness   Patient is a 63 y.o. male who presented with unsteady gait/right-sided weakness-found to have acute CVA. Past history of HTN, tobacco use.     Clinical Impressions Pt completes ambulation to and from bathroom, manages toileting and standing grooming modified independently. Reinforced established HEP with theraband for R UE. Pt verbalized understanding. Pt is using R UE as his lead in ADLs without difficulty. Reports no further diplopia. Plan for cardiac cath tomorrow. Pt      If plan is discharge home, recommend the following:   Assist for transportation     Functional Status Assessment         Equipment Recommendations   None recommended by OT     Recommendations for Other Services         Precautions/Restrictions   Precautions Precautions: Fall Recall of Precautions/Restrictions: Intact Restrictions Weight Bearing Restrictions Per Provider Order: No     Mobility Bed Mobility Overal bed mobility: Modified Independent                  Transfers Overall transfer level: Modified independent Equipment used: None               General transfer comment: pt is routinely walking in room using furniture or walker      Balance Overall balance assessment: Needs assistance   Sitting balance-Leahy Scale: Good       Standing balance-Leahy Scale: Fair                             ADL either performed or assessed with clinical judgement   ADL Overall ADL's : Needs assistance/impaired Eating/Feeding: Independent;Sitting   Grooming: Wash/dry hands;Wash/dry face;Oral care;Modified independent                   Toilet Transfer: Modified Independent   Toileting- Clothing Manipulation and Hygiene: Modified independent       Functional mobility during ADLs: Modified  independent;Rolling walker (2 wheels)       Vision         Perception         Praxis         Pertinent Vitals/Pain Pain Assessment Pain Assessment: No/denies pain     Extremity/Trunk Assessment             Communication Communication Communication: No apparent difficulties   Cognition Arousal: Alert Behavior During Therapy: WFL for tasks assessed/performed Cognition: No apparent impairments                               Following commands: Intact       Cueing  General Comments   Cueing Techniques: Verbal cues  VSS   Exercises Other Exercises Other Exercises: reinforced R UE HEP with theraband   Shoulder Instructions      Home Living                                          Prior Functioning/Environment                      OT Problem List:     OT Treatment/Interventions:  OT Goals(Current goals can be found in the care plan section)   Acute Rehab OT Goals OT Goal Formulation: With patient Time For Goal Achievement: 08/29/23 Potential to Achieve Goals: Good   OT Frequency:  Min 2X/week    Co-evaluation              AM-PAC OT 6 Clicks Daily Activity     Outcome Measure Help from another person eating meals?: None Help from another person taking care of personal grooming?: None Help from another person toileting, which includes using toliet, bedpan, or urinal?: None Help from another person bathing (including washing, rinsing, drying)?: None Help from another person to put on and taking off regular upper body clothing?: None Help from another person to put on and taking off regular lower body clothing?: None 6 Click Score: 24   End of Session Equipment Utilized During Treatment: Rolling walker (2 wheels)  Activity Tolerance: Patient tolerated treatment well Patient left: in chair;with call bell/phone within reach  OT Visit Diagnosis: Unsteadiness on feet (R26.81);Hemiplegia and  hemiparesis Hemiplegia - Right/Left: Right Hemiplegia - dominant/non-dominant: Dominant Hemiplegia - caused by: Cerebral infarction                Time: 1151-1205 OT Time Calculation (min): 14 min Charges:  OT General Charges $OT Visit: 1 Visit OT Treatments $Self Care/Home Management : 8-22 mins  Mliss HERO, OTR/L Acute Rehabilitation Services Office: [phone]   Kennth Mliss Helling 08/16/2023, 12:33 PM

## 2023-08-16 NOTE — Progress Notes (Addendum)
 Progress Note  Patient Name: Henry Schmitt Date of Encounter: 08/16/2023 [facility] Cardiologist: Jerel Balding, MD   Interval Summary   Reported having some shortness of breath and wore a nasal cannula overnight to help.  Denies any orthopnea or lower extremity edema.  Was able to walk in hallway yesterday and denied having any chest pain while doing this.  Vital Signs Vitals:   08/15/23 1706 08/15/23 2115 08/15/23 2351 08/16/23 0420  BP: (!) 163/83 (!) 142/82 132/67 139/73  Pulse: 70 80 66 61  Resp: 18 17 12 17   Temp: 97.6 F (36.4 C) 97.8 F (36.6 C) 98.3 F (36.8 C) (!) 97.5 F (36.4 C)  TempSrc: Oral Oral Oral Oral  SpO2: 97% 96% 97% 100%  Weight:      Height:       No intake or output data in the 24 hours ending 08/16/23 0742    08/14/2023   10:43 AM 10/11/2022    5:49 PM 08/15/2020    7:44 PM  Last 3 Weights  Weight (lbs) 187 lb 188 lb 0.8 oz 188 lb  Weight (kg) 84.823 kg 85.3 kg 85.276 kg      Telemetry/ECG  Normal sinus rhythm with resting heart rates in the 50s to 70s- Personally Reviewed  Physical Exam  GEN: No acute distress.   Neck: Positive JVD. Cardiac: RRR, no murmurs, rubs, or gallops.  Respiratory: Crackles heard in bilateral lower lung lobes. GI: Soft, nontender, non-distended  MS: No edema  Assessment & Plan  Henry Schmitt is a 63 y.o. male with a hx of hypertension and tobacco use who is being seen 08/15/2023 for the evaluation of new onset HFrEF at the request of Donalda Applebaum MD.   New onset HFrEF Unstable angina In the setting of a pontine CVA patient was also complaining of shortness of breath and chest pain on exertion. The patient's EKG also showed a new left bundle branch block.  Echocardiogram showed a reduced LVEF of 30 to 35%, and wall motion abnormalities that were felt to be likely secondary to the bundle branch block.  Prior echo in 2017 showed normal LVEF.   High-sensitivity troponins 21> 23.  On exam the patient was felt to  be volume overloaded and was started on 20 mg IV Lasix .  On physical exam today remains slightly volume up.  Does not have I's and O's documented.  Does not have a weight for today.  Will put in nursing orders.  With the chest pain concerning for unstable angina, and EKG changes the patient was tentatively planned to get a left and right heart cath on Friday (08/17/2023) after he is more euvolemic.  Informed Consent   Shared Decision Making/Informed Consent{  The risks [stroke (1 in 1000), death (1 in 1000), kidney failure [usually temporary] (1 in 500), bleeding (1 in 200), allergic reaction [possibly serious] (1 in 200)], benefits (diagnostic support and management of coronary artery disease) and alternatives of a cardiac catheterization were discussed in detail with Henry Schmitt and he is willing to proceed.      Is already on aspirin , Plavix , and high intensity statin for CVA. Order 40mg  IV lasix . Will need outpatient echocardiogram to reassess LVEF in about 3 months.  GDMT out of permissive hypertension window per neurology.  Blood pressure has been labile and slightly elevated at times.  Most recent BP 166/85. Increase to coreg  6.25 mg twice daily. Start spirolactone 25mg  daily. Plan to start SGLT2 and ARNI after cardiac catheterization. Stop  hydrochlorothiazide  25 mg daily.    Hypertension PTA was on amlodipine  2.5 mg daily and Hydrochlorothiazide  25 mg daily.  Stop these medications in favor of GDMT. Per recent note by neurology the patient is out of the window for permissive hypertension and it is okay to start GDMT.    Hyperlipidemia Goal < 70 LDL 93    HDL 41   TG 169 Continue lipitor 40 mg    CVA Presented to the emergency department with right-sided leg weakness and visual disturbances.  MRI indicated acute right pontine infarct.  Is being followed by neurology. Continue ASA 81 mg Continue Plavix  75 mg Continue lipitor 40 mg    Bilateral carotid stenosis Seen on head and  neck CT this admission. Will continue to monitor. Patient currently on antiplatelet and statin as listed above.    Tobacco Use Patient has used about half a pack a day for 40 years Educated on cessation.   Otherwise managed per primary   For questions or updates, please contact [facility] Please consult [email or web address] for contact info under       Signed, Morse Clause, PA-C   I have seen and examined the patient along with Zane Adams, PA-C .  I have reviewed the chart, notes and new data.  I agree with PA/NP's note.  Key new complaints: mild dyspnea overnight Key examination changes: mild JVD. BP is high. Key new findings / data: metabolic profile not bad, but room for improvement, target LDL<70.  (Currently LDL 93, borderline TG 169; A1c 5.3%; normal renal parameters, K 3.8)  PLAN: For R and LHC tomorrow, continue titrating HF meds.  Informed Consent   Shared Decision Making/Informed Consent The risks [stroke (1 in 1000), death (1 in 1000), kidney failure [usually temporary] (1 in 500), bleeding (1 in 200), allergic reaction [possibly serious] (1 in 200)], benefits (diagnostic support and management of coronary artery disease) and alternatives of a cardiac catheterization were discussed in detail with Henry Schmitt and he is willing to proceed.      Cataleya Cristina, MD, FACC [facility] [phone] 08/16/2023, 10:34 AM

## 2023-08-16 NOTE — Plan of Care (Signed)

## 2023-08-16 NOTE — Progress Notes (Signed)
 Mobility Specialist Progress Note:   08/16/23 0915  Mobility  Activity Ambulated with assistance  Level of Assistance Contact guard assist, steadying assist  Assistive Device None  Distance Ambulated (ft) 300 ft  Activity Response Tolerated well  Mobility Referral Yes  Mobility visit 1 Mobility  Mobility Specialist Start Time (ACUTE ONLY) 0915  Mobility Specialist Stop Time (ACUTE ONLY) 0926  Mobility Specialist Time Calculation (min) (ACUTE ONLY) 11 min   Pt agreeable to mobility session. Required no physical assistance, only minG for safety. Pt ambulated with steady gait throughout. Excited about R side improvements, pt left in chair with all needs met.   Therisa Rana Mobility Specialist Please contact via SecureChat or  Rehab office at [phone]

## 2023-08-16 NOTE — Progress Notes (Signed)
 Physical Therapy Treatment Patient Details Name: Henry Schmitt MRN: [id number] DOB: Aug 04, 1960 Today's Date: 08/16/2023   History of Present Illness Patient is a 63 y.o. male who presented with unsteady gait/right-sided weakness-found to have acute CVA. Past history of HTN, tobacco use.    PT Comments  Pt tolerated treatment well today. Pt received up in bathroom with no AD. Pt able to ambulate around unit and navigate stairs with supervision/CGA. No change in DC/DME recs at this time. PT will continue to follow.     If plan is discharge home, recommend the following: A little help with walking and/or transfers;A little help with bathing/dressing/bathroom;Assistance with cooking/housework;Assist for transportation;Help with stairs or ramp for entrance;Supervision due to cognitive status   Can travel by private vehicle        Equipment Recommendations  Rolling walker (2 wheels)    Recommendations for Other Services       Precautions / Restrictions Precautions Precautions: Fall Recall of Precautions/Restrictions: Intact Restrictions Weight Bearing Restrictions Per Provider Order: No     Mobility  Bed Mobility Overal bed mobility: Independent                  Transfers                   General transfer comment: Pt received up in the bathroom.    Ambulation/Gait Ambulation/Gait assistance: Supervision Gait Distance (Feet): 250 Feet Assistive device: Rolling walker (2 wheels) Gait Pattern/deviations: Trunk flexed, Decreased stride length, Step-through pattern Gait velocity: decreased     General Gait Details: Pt able to ambulate without AD today. no LOB or buckling noted.   Stairs Stairs: Yes Stairs assistance: Contact guard assist Stair Management: One rail Right, Step to pattern, Forwards Number of Stairs: 10 General stair comments: Cues for sequencing.   Wheelchair Mobility     Tilt Bed    Modified Rankin (Stroke Patients Only)        Balance Overall balance assessment: Needs assistance Sitting-balance support: Bilateral upper extremity supported, Feet supported Sitting balance-Leahy Scale: Good     Standing balance support: Bilateral upper extremity supported, No upper extremity supported Standing balance-Leahy Scale: Fair                              Hotel manager: No apparent difficulties  Cognition Arousal: Alert Behavior During Therapy: WFL for tasks assessed/performed   PT - Cognitive impairments: No apparent impairments                         Following commands: Intact      Cueing Cueing Techniques: Verbal cues, Tactile cues  Exercises      General Comments General comments (skin integrity, edema, etc.): VSS      Pertinent Vitals/Pain Pain Assessment Pain Assessment: No/denies pain    Home Living                          Prior Function            PT Goals (current goals can now be found in the care plan section) Progress towards PT goals: Progressing toward goals    Frequency    Min 2X/week      PT Plan      Co-evaluation              AM-PAC PT 6 Clicks Mobility  Outcome Measure  Help needed turning from your back to your side while in a flat bed without using bedrails?: None Help needed moving from lying on your back to sitting on the side of a flat bed without using bedrails?: None Help needed moving to and from a bed to a chair (including a wheelchair)?: None Help needed standing up from a chair using your arms (e.g., wheelchair or bedside chair)?: A Little Help needed to walk in hospital room?: A Little Help needed climbing 3-5 steps with a railing? : A Little 6 Click Score: 21    End of Session Equipment Utilized During Treatment: Gait belt Activity Tolerance: Patient tolerated treatment well Patient left: in bed;with call bell/phone within reach Nurse Communication: Mobility status PT Visit  Diagnosis: Other abnormalities of gait and mobility (R26.89)     Time: 8895-8886 PT Time Calculation (min) (ACUTE ONLY): 9 min  Charges:    $Gait Training: 8-22 mins PT General Charges $$ ACUTE PT VISIT: 1 Visit                     Sueellen NOVAK, PT, DPT Acute Rehab Services [phone]    Kiyla Ringler 08/16/2023, 12:04 PM

## 2023-08-16 NOTE — Progress Notes (Signed)
 PROGRESS NOTE        PATIENT DETAILS Name: Henry Schmitt Age: 63 y.o. Sex: male Date of Birth: [birth year] Admit Date: 08/14/2023 Admitting Physician Joette Pebbles, MD ERE:Ryjefjw, Hoy CROME, PA-C  Brief Summary: Patient is a 63 y.o.  male with history of HTN, tobacco use-who presented with unsteady gait/right-sided weakness-found to have acute CVA and new diagnosis of HFrEF.  Significant events: 8/5>> admit to [facility]  Significant studies: 8/5>> MRI brain: Acute right pontine infarct 8/5>> CTA head/neck: No LVO, 40% stenosis at the origin of the right cervical ICA, 50% stenosis origin of left cervical ICA.  Moderate stenosis left cavernous ICA. 8/5>> echo: EF 30-35%. 8/6>> LDL: 93 8/6>> A1c: 5.3  Significant microbiology data: None  Procedures: None  Consults: Neurology Cardiology.  Subjective: No major issues overnight-now acknowledges getting short of breath when he walks his dog-especially when he climbs a flight of stairs.  He occasionally will have chest pain as well.  Objective: Vitals: Blood pressure (!) 166/85, pulse 74, temperature 97.6 F (36.4 C), temperature source Oral, resp. rate (!) 21, height 5' 6 (1.676 m), weight 84.8 kg, SpO2 95%.   Exam: Awake/Alert Chest: Clear to auscultation CVS: S1-S2 regular Abdomen: Soft nontender nondistended. No exam.  Pertinent Labs/Radiology:    Latest Ref Rng & Units 08/15/2023    5:59 AM 08/14/2023   10:46 AM 10/11/2022    6:00 PM  CBC  WBC 4.0 - 10.5 K/uL 12.1  15.6  15.1   Hemoglobin 13.0 - 17.0 g/dL 86.3  85.6  86.3   Hematocrit 39.0 - 52.0 % 42.4  44.7  43.0   Platelets 150 - 400 K/uL 298  339  344     Lab Results  Component Value Date   NA 135 08/15/2023   K 3.8 08/15/2023   CL 101 08/15/2023   CO2 26 08/15/2023      Assessment/Plan: Acute right pontine infarct Presenting symptoms have resolved Etiology small vessel disease Aspirin /Plavix  x 3 weeks followed by aspirin   alone but regimen may need to be adjusted based on LHC. Remains on high intensity statin.  B/L carotid artery stenosis Incidental finding Probably asymptomatic In any event-on antiplatelet/statin. Needs outpatient follow-up with vascular surgery for close monitoring.  Unstable angina This morning-acknowledges getting chest pain with exertion for the past several months. LBBB on EKG Echo with reduced EF On antiplatelets LHC/RHC scheduled for tomorrow.  New diagnosis of HFrEF Euvolemic This morning acknowledges-exertional dyspnea as well. Cardiology following-with plans to start GDMT over the next several days.  HTN Initially permissive hypertension allowed-now on Coreg /Aldactone .  Tobacco abuse Counseled  EtOH use Drinks brandy/beer 3 times weekly No evidence of withdrawal symptoms currently.  Class 1 Obesity: Estimated body mass index is 30.18 kg/m as calculated from the following:   Height as of this encounter: 5' 6 (1.676 m).   Weight as of this encounter: 84.8 kg.   Code status:   Code Status: Full Code   DVT Prophylaxis: enoxaparin  (LOVENOX ) injection 40 mg Start: 08/14/23 1600   Family Communication: None at bedside   Disposition Plan: Status is: Observation The patient remains OBS appropriate and will d/c before 2 midnights.   Planned Discharge Destination:Home   Diet: Diet Order             Diet Heart Fluid consistency: Thin  Diet effective now  Antimicrobial agents: Anti-infectives (From admission, onward)    None        MEDICATIONS: Scheduled Meds:  aspirin  EC  81 mg Oral Daily   atorvastatin   40 mg Oral Daily   carvedilol   6.25 mg Oral BID WC   clopidogrel   75 mg Oral Daily   enoxaparin  (LOVENOX ) injection  40 mg Subcutaneous Q24H   nitroGLYCERIN   0.5 inch Topical Once   spironolactone   25 mg Oral Daily   Continuous Infusions:   PRN Meds:.acetaminophen  **OR** acetaminophen  (TYLENOL ) oral liquid 160  mg/5 mL **OR** acetaminophen    I have personally reviewed following labs and imaging studies  LABORATORY DATA: CBC: Recent Labs  Lab 08/14/23 1046 08/15/23 0559  WBC 15.6* 12.1*  HGB 14.3 13.6  HCT 44.7 42.4  MCV 94.5 93.8  PLT 339 298    Basic Metabolic Panel: Recent Labs  Lab 08/14/23 1046 08/15/23 0559  NA 134* 135  K 3.9 3.8  CL 99 101  CO2 24 26  GLUCOSE 114* 129*  BUN 19 16  CREATININE 0.86 0.98  CALCIUM  9.4 9.1    GFR: Estimated Creatinine Clearance: 78.8 mL/min (by C-G formula based on SCr of 0.98 mg/dL).  Liver Function Tests: Recent Labs  Lab 08/15/23 0559  AST 24  ALT 27  ALKPHOS 97  BILITOT 0.3  PROT 6.7  ALBUMIN 3.3*   No results for input(s): LIPASE, AMYLASE in the last 168 hours. No results for input(s): AMMONIA in the last 168 hours.  Coagulation Profile: No results for input(s): INR, PROTIME in the last 168 hours.  Cardiac Enzymes: No results for input(s): CKTOTAL, CKMB, CKMBINDEX, TROPONINI in the last 168 hours.  BNP (last 3 results) No results for input(s): PROBNP in the last 8760 hours.  Lipid Profile: Recent Labs    08/15/23 0559  CHOL 168  HDL 41  LDLCALC 93  TRIG 169*  CHOLHDL 4.1    Thyroid Function Tests: No results for input(s): TSH, T4TOTAL, FREET4, T3FREE, THYROIDAB in the last 72 hours.  Anemia Panel: No results for input(s): VITAMINB12, FOLATE, FERRITIN, TIBC, IRON, RETICCTPCT in the last 72 hours.  Urine analysis:    Component Value Date/Time   COLORURINE YELLOW 10/31/2019 0920   APPEARANCEUR CLEAR 10/31/2019 0920   LABSPEC 1.018 10/31/2019 0920   PHURINE 5.0 10/31/2019 0920   GLUCOSEU NEGATIVE 10/31/2019 0920   HGBUR NEGATIVE 10/31/2019 0920   BILIRUBINUR NEGATIVE 10/31/2019 0920   KETONESUR NEGATIVE 10/31/2019 0920   PROTEINUR NEGATIVE 10/31/2019 0920   UROBILINOGEN 0.2 02/10/2008 0229   NITRITE NEGATIVE 10/31/2019 0920   LEUKOCYTESUR SMALL (A)  10/31/2019 0920    Sepsis Labs: Lactic Acid, Venous    Component Value Date/Time   LATICACIDVEN 1.43 02/03/2016 2127    MICROBIOLOGY: No results found for this or any previous visit (from the past 240 hours).  RADIOLOGY STUDIES/RESULTS: CT ANGIO HEAD NECK W WO CM Result Date: 08/14/2023 EXAM: CTA HEAD AND NECK WITHOUT AND WITH 08/14/2023 07:23:00 PM TECHNIQUE: CTA of the head and neck was performed without and with the administration of intravenous contrast. Multiplanar 2D and/or 3D reformatted images are provided for review. Automated exposure control, iterative reconstruction, and/or weight based adjustment of the mA/kV was utilized to reduce the radiation dose to as low as reasonably achievable. Stenosis of the internal carotid arteries measured using NASCET criteria. COMPARISON: MRI head earlier same day and CT head on 10/31/2022. CLINICAL HISTORY: 63 year old male with past medical history of hypertension and hyperlipidemia presenting to the emergency department with  chest pressure, right-sided weakness, and dizziness. Concern for possible CVA. FINDINGS: CTA NECK: AORTIC ARCH AND ARCH VESSELS: Mild atherosclerosis of the aortic arch. Common origin of the brachiocephalic and left common carotid arteries. Mild atherosclerosis of the bilateral subclavian arteries without high-grade stenosis. CERVICAL CAROTID ARTERIES: Mixed atherosclerotic plaque at the right carotid bifurcation resulting in approximately 40% stenosis at the origin of the right cervical ICA. Additional mixed atherosclerotic plaque at the left carotid bifurcation resulting in approximately 50% stenosis at the origin of the left cervical ICA. CERVICAL VERTEBRAL ARTERIES: Patent from the origins to the vertebrobasilar confluence. Tortuosity of the right V1 segment. Mild focal atherosclerosis of the proximal left V2 segment resulting in moderate stenosis. LUNGS AND MEDIASTINUM: Unremarkable. SOFT TISSUES: No acute abnormality. BONES: No  acute abnormality. CTA HEAD: ANTERIOR CIRCULATION: Prominent calcified atherosclerosis of the carotid siphons. Moderate stenosis of the distal left cavernous ICA and additional mild-to-moderate stenosis of the right cavernous ICA. The MCAs are patent bilaterally. The ACAs are patent bilaterally. POSTERIOR CIRCULATION: No significant stenosis of the posterior cerebral arteries. No significant stenosis of the basilar artery. No significant stenosis of the vertebral arteries. No aneurysm. OTHER: Region of infarct in the right pons noted on the same day MRI is not well visualized on the current CT. No dural venous sinus thrombosis on this non-dedicated study. IMPRESSION: 1. No large vessel occlusion in the head or neck. 2. Mixed atherosclerotic plaque at the right carotid bifurcation resulting in approximately 40% stenosis at the origin of the right cervical ICA. 3. Mixed atherosclerotic plaque at the left carotid bifurcation resulting in approximately 50% stenosis at the origin of the left cervical ICA. 4. Moderate stenosis of the distal left cavernous ICA and additional mild-to-moderate stenosis of the right cavernous ICA. 5. Focal atherosclerosis of the proximal left V2 segment resulting in moderate stenosis. Electronically signed by: Donnice Mania MD 08/14/2023 08:46 PM EDT RP Workstation: [id number]   ECHOCARDIOGRAM COMPLETE Result Date: 08/14/2023    ECHOCARDIOGRAM REPORT   Patient Name:   MOSES ODOHERTY Date of Exam: 08/14/2023 Medical Rec #:  [id number]    Height:       66.0 in Accession #:    [id number]   Weight:       187.0 lb Date of Birth:  January 02, 1961   BSA:          1.943 m Patient Age:    62 years     BP:           151/86 mmHg Patient Gender: M            HR:           67 bpm. Exam Location:  Inpatient Procedure: 2D Echo (Both Spectral and Color Flow Doppler were utilized during            procedure). Indications:    stroke  History:        Patient has prior history of Echocardiogram examinations, most                  recent 04/19/2015. Arrythmias:LBBB; Risk Factors:Current Smoker                 and Hypertension.  Sonographer:    Tinnie Barefoot RDCS Referring Phys: [id number] JOETTE PEBBLES IMPRESSIONS  1. Global hypokinesis worse in the inferior wall . Left ventricular ejection fraction, by estimation, is 30 to 35%. The left ventricle has moderately decreased function. The left ventricle demonstrates global hypokinesis. The left ventricular internal cavity size  was mildly dilated. There is mild left ventricular hypertrophy. Left ventricular diastolic parameters are consistent with Grade I diastolic dysfunction (impaired relaxation).  2. Right ventricular systolic function is normal. The right ventricular size is normal.  3. The mitral valve is abnormal. No evidence of mitral valve regurgitation. No evidence of mitral stenosis.  4. The aortic valve is tricuspid. There is mild calcification of the aortic valve. There is mild thickening of the aortic valve. Aortic valve regurgitation is not visualized. Aortic valve sclerosis is present, with no evidence of aortic valve stenosis.  5. The inferior vena cava is normal in size with greater than 50% respiratory variability, suggesting right atrial pressure of 3 mmHg. FINDINGS  Left Ventricle: Global hypokinesis worse in the inferior wall. Left ventricular ejection fraction, by estimation, is 30 to 35%. The left ventricle has moderately decreased function. The left ventricle demonstrates global hypokinesis. Strain was performed and the global longitudinal strain is indeterminate. The left ventricular internal cavity size was mildly dilated. There is mild left ventricular hypertrophy. Left ventricular diastolic parameters are consistent with Grade I diastolic dysfunction (impaired relaxation). Right Ventricle: The right ventricular size is normal. No increase in right ventricular wall thickness. Right ventricular systolic function is normal. Left Atrium: Left atrial size was normal  in size. Right Atrium: Right atrial size was normal in size. Pericardium: There is no evidence of pericardial effusion. Mitral Valve: The mitral valve is abnormal. There is mild thickening of the mitral valve leaflet(s). No evidence of mitral valve regurgitation. No evidence of mitral valve stenosis. Tricuspid Valve: The tricuspid valve is normal in structure. Tricuspid valve regurgitation is trivial. No evidence of tricuspid stenosis. Aortic Valve: The aortic valve is tricuspid. There is mild calcification of the aortic valve. There is mild thickening of the aortic valve. Aortic valve regurgitation is not visualized. Aortic valve sclerosis is present, with no evidence of aortic valve stenosis. Pulmonic Valve: The pulmonic valve was normal in structure. Pulmonic valve regurgitation is trivial. No evidence of pulmonic stenosis. Aorta: The aortic root is normal in size and structure. Venous: The inferior vena cava is normal in size with greater than 50% respiratory variability, suggesting right atrial pressure of 3 mmHg. IAS/Shunts: No atrial level shunt detected by color flow Doppler. Additional Comments: 3D was performed not requiring image post processing on an independent workstation and was indeterminate.  LEFT VENTRICLE PLAX 2D LVIDd:         4.80 cm     Diastology LVIDs:         4.10 cm     LV e' medial:    4.35 cm/s LV PW:         1.20 cm     LV E/e' medial:  12.6 LV IVS:        1.30 cm     LV e' lateral:   4.68 cm/s LVOT diam:     2.10 cm     LV E/e' lateral: 11.7 LV SV:         48 LV SV Index:   25 LVOT Area:     3.46 cm  LV Volumes (MOD) LV vol d, MOD A2C: 97.6 ml LV vol d, MOD A4C: 88.6 ml LV vol s, MOD A2C: 64.4 ml LV vol s, MOD A4C: 59.1 ml LV SV MOD A2C:     33.2 ml LV SV MOD A4C:     88.6 ml LV SV MOD BP:      31.8 ml RIGHT VENTRICLE  IVC RV Basal diam:  2.50 cm     IVC diam: 1.30 cm RV S prime:     11.70 cm/s TAPSE (M-mode): 1.6 cm LEFT ATRIUM             Index        RIGHT ATRIUM           Index LA diam:        3.30 cm 1.70 cm/m   RA Area:     9.55 cm LA Vol (A2C):   28.4 ml 14.61 ml/m  RA Volume:   17.40 ml 8.95 ml/m LA Vol (A4C):   26.9 ml 13.84 ml/m LA Biplane Vol: 28.1 ml 14.46 ml/m  AORTIC VALVE LVOT Vmax:   76.50 cm/s LVOT Vmean:  46.000 cm/s LVOT VTI:    0.140 m  AORTA Ao Root diam: 2.90 cm Ao Asc diam:  3.00 cm MITRAL VALVE MV Area (PHT): 3.65 cm    SHUNTS MV Decel Time: 208 msec    Systemic VTI:  0.14 m MV E velocity: 54.70 cm/s  Systemic Diam: 2.10 cm MV A velocity: 91.00 cm/s MV E/A ratio:  0.60 Maude Emmer MD Electronically signed by Maude Emmer MD Signature Date/Time: 08/14/2023/6:09:55 PM    Final    DG Chest Port 1 View Result Date: 08/14/2023 EXAM: 1 VIEW XRAY OF THE CHEST 08/14/2023 12:38:00 PM COMPARISON: 08/15/2020 CLINICAL HISTORY: CP. Reason for exam: CP; Triage notes: Pt BIB EMS from PCP office with CC of CP with HTN x 3 weeks. Pt also reported right sided weakness with alterations in balance and gait as a result x 6 days. Aox4. FINDINGS: LUNGS AND PLEURA: No focal pulmonary opacity. No pulmonary edema. No pleural effusion. No pneumothorax. HEART AND MEDIASTINUM: Borderline cardiomegaly. No acute abnormality of the mediastinal silhouette. BONES AND SOFT TISSUES: No acute osseous abnormality. Multiple wires and leads project over the chest on the frontal radiograph. IMPRESSION: 1. No acute findings. 2. Borderline cardiomegaly. Electronically signed by: Rockey Kilts MD 08/14/2023 01:43 PM EDT RP Workstation: [id number]   MR BRAIN WO CONTRAST Result Date: 08/14/2023 CLINICAL DATA:  Right-sided weakness with balance alteration EXAM: MRI HEAD WITHOUT CONTRAST TECHNIQUE: Multiplanar, multiecho pulse sequences of the brain and surrounding structures were obtained without intravenous contrast. COMPARISON:  April 09, 2014 FINDINGS: MRI brain: There is acute infarct in the right side of the pons. There are several foci of T2 hyperintensity in the cerebral white matter. These do  not have restricted diffusion. The ventricles are normal. No mass lesion. There are normal flow signals in the carotid arteries and basilar artery. No significant bone marrow signal abnormality. No significant abnormality in the paranasal sinuses or soft tissues. IMPRESSION: Acute right pontine infarct Electronically Signed   By: Nancyann Burns M.D.   On: 08/14/2023 11:53     LOS: 0 days   Donalda Applebaum, MD  [facility]    To contact the attending provider between 7A-7P or the covering provider during after hours 7P-7A, please log into the web site [email or web address] and access using universal [place] password for that web site. If you do not have the password, please call the hospital operator.  08/16/2023, 10:52 AM

## 2023-08-17 ENCOUNTER — Other Ambulatory Visit (HOSPITAL_COMMUNITY): Payer: Self-pay

## 2023-08-17 ENCOUNTER — Encounter (HOSPITAL_COMMUNITY)
Admission: EM | Disposition: A | Discharge status: Home / Self Care | Payer: Self-pay | Source: Home / Self Care | Attending: Internal Medicine

## 2023-08-17 ENCOUNTER — Encounter (HOSPITAL_COMMUNITY): Payer: Self-pay | Admitting: Cardiology

## 2023-08-17 ENCOUNTER — Telehealth (HOSPITAL_COMMUNITY): Payer: Self-pay | Admitting: Pharmacy Technician

## 2023-08-17 DIAGNOSIS — I10 Essential (primary) hypertension: Secondary | ICD-10-CM | POA: Diagnosis not present

## 2023-08-17 DIAGNOSIS — I639 Cerebral infarction, unspecified: Secondary | ICD-10-CM | POA: Diagnosis not present

## 2023-08-17 DIAGNOSIS — I2510 Atherosclerotic heart disease of native coronary artery without angina pectoris: Secondary | ICD-10-CM

## 2023-08-17 DIAGNOSIS — I447 Left bundle-branch block, unspecified: Secondary | ICD-10-CM | POA: Diagnosis not present

## 2023-08-17 DIAGNOSIS — I5021 Acute systolic (congestive) heart failure: Secondary | ICD-10-CM

## 2023-08-17 HISTORY — PX: RIGHT/LEFT HEART CATH AND CORONARY ANGIOGRAPHY: CATH118266

## 2023-08-17 LAB — POCT I-STAT EG7
Acid-base deficit: 1 mmol/L (ref 0.0–2.0)
Acid-base deficit: 2 mmol/L (ref 0.0–2.0)
Acid-base deficit: 3 mmol/L — ABNORMAL HIGH (ref 0.0–2.0)
Bicarbonate: 21.2 mmol/L (ref 20.0–28.0)
Bicarbonate: 23.8 mmol/L (ref 20.0–28.0)
Bicarbonate: 24.7 mmol/L (ref 20.0–28.0)
Calcium, Ion: 1.19 mmol/L (ref 1.15–1.40)
Calcium, Ion: 1.22 mmol/L (ref 1.15–1.40)
Calcium, Ion: 1.26 mmol/L (ref 1.15–1.40)
HCT: 40 % (ref 39.0–52.0)
HCT: 41 % (ref 39.0–52.0)
HCT: 41 % (ref 39.0–52.0)
Hemoglobin: 13.6 g/dL (ref 13.0–17.0)
Hemoglobin: 13.9 g/dL (ref 13.0–17.0)
Hemoglobin: 13.9 g/dL (ref 13.0–17.0)
O2 Saturation: 66 %
O2 Saturation: 67 %
O2 Saturation: 99 %
Potassium: 3.7 mmol/L (ref 3.5–5.1)
Potassium: 3.8 mmol/L (ref 3.5–5.1)
Potassium: 3.9 mmol/L (ref 3.5–5.1)
Sodium: 137 mmol/L (ref 135–145)
Sodium: 138 mmol/L (ref 135–145)
Sodium: 138 mmol/L (ref 135–145)
TCO2: 22 mmol/L (ref 22–32)
TCO2: 25 mmol/L (ref 22–32)
TCO2: 26 mmol/L (ref 22–32)
pCO2, Ven: 34.5 mmHg — ABNORMAL LOW (ref 44–60)
pCO2, Ven: 43.9 mmHg — ABNORMAL LOW (ref 44–60)
pCO2, Ven: 44.9 mmHg (ref 44–60)
pH, Ven: 7.342 (ref 7.25–7.43)
pH, Ven: 7.348 (ref 7.25–7.43)
pH, Ven: 7.396 (ref 7.25–7.43)
pO2, Ven: 115 mmHg — ABNORMAL HIGH (ref 32–45)
pO2, Ven: 36 mmHg (ref 32–45)
pO2, Ven: 37 mmHg (ref 32–45)

## 2023-08-17 LAB — CBC
HCT: 44.6 % (ref 39.0–52.0)
Hemoglobin: 14.1 g/dL (ref 13.0–17.0)
MCH: 29.8 pg (ref 26.0–34.0)
MCHC: 31.6 g/dL (ref 30.0–36.0)
MCV: 94.3 fL (ref 80.0–100.0)
Platelets: 334 K/uL (ref 150–400)
RBC: 4.73 MIL/uL (ref 4.22–5.81)
RDW: 13.3 % (ref 11.5–15.5)
WBC: 12.6 K/uL — ABNORMAL HIGH (ref 4.0–10.5)
nRBC: 0 % (ref 0.0–0.2)

## 2023-08-17 LAB — MAGNESIUM: Magnesium: 2.1 mg/dL (ref 1.7–2.4)

## 2023-08-17 LAB — BASIC METABOLIC PANEL WITH GFR
Anion gap: 9 (ref 5–15)
BUN: 24 mg/dL — ABNORMAL HIGH (ref 8–23)
CO2: 25 mmol/L (ref 22–32)
Calcium: 9.5 mg/dL (ref 8.9–10.3)
Chloride: 102 mmol/L (ref 98–111)
Creatinine, Ser: 0.99 mg/dL (ref 0.61–1.24)
GFR, Estimated: >60 mL/min (ref >60)
Glucose, Bld: 125 mg/dL — ABNORMAL HIGH (ref 70–99)
Potassium: 3.9 mmol/L (ref 3.5–5.1)
Sodium: 136 mmol/L (ref 135–145)

## 2023-08-17 LAB — CREATININE, SERUM
Creatinine, Ser: 0.94 mg/dL (ref 0.61–1.24)
GFR, Estimated: >60 mL/min (ref >60)

## 2023-08-17 SURGERY — RIGHT/LEFT HEART CATH AND CORONARY ANGIOGRAPHY
Anesthesia: LOCAL

## 2023-08-17 MED ORDER — IOHEXOL 350 MG/ML SOLN
INTRAVENOUS | Status: DC | PRN
Start: 2023-08-17 — End: 2023-08-17
  Administered 2023-08-17: 35 mL

## 2023-08-17 MED ORDER — HEPARIN SODIUM (PORCINE) 1000 UNIT/ML IJ SOLN
INTRAMUSCULAR | Status: AC
  Filled 2023-08-17: qty 10

## 2023-08-17 MED ORDER — CARVEDILOL 12.5 MG PO TABS
12.5000 mg | ORAL_TABLET | Freq: Two times a day (BID) | ORAL | 2 refills | Status: DC
  Filled 2023-08-17: qty 60, 30d supply, fill #0

## 2023-08-17 MED ORDER — FUROSEMIDE 40 MG PO TABS
40.0000 mg | ORAL_TABLET | Freq: Every day | ORAL | 2 refills | Status: DC
  Filled 2023-08-17: qty 30, 30d supply, fill #0

## 2023-08-17 MED ORDER — HEPARIN SODIUM (PORCINE) 1000 UNIT/ML IJ SOLN
INTRAMUSCULAR | Status: DC | PRN
  Administered 2023-08-17: 4000 [IU] via INTRAVENOUS

## 2023-08-17 MED ORDER — SODIUM CHLORIDE 0.9% FLUSH: 3.0000 mL | INTRAVENOUS | Status: DC | PRN

## 2023-08-17 MED ORDER — SODIUM CHLORIDE 0.9 % IV SOLN: 250.0000 mL | INTRAVENOUS | Status: DC | PRN

## 2023-08-17 MED ORDER — DAPAGLIFLOZIN PROPANEDIOL 10 MG PO TABS: 10.0000 mg | ORAL_TABLET | Freq: Every day | ORAL | Status: DC

## 2023-08-17 MED ORDER — CLOPIDOGREL BISULFATE 75 MG PO TABS
75.0000 mg | ORAL_TABLET | Freq: Every day | ORAL | 2 refills | Status: DC
  Filled 2023-08-17: qty 30, 30d supply, fill #0

## 2023-08-17 MED ORDER — SODIUM CHLORIDE 0.9% FLUSH: 3.0000 mL | Freq: Two times a day (BID) | INTRAVENOUS | Status: DC

## 2023-08-17 MED ORDER — LIDOCAINE HCL (PF) 1 % IJ SOLN
INTRAMUSCULAR | Status: AC
  Filled 2023-08-17: qty 30

## 2023-08-17 MED ORDER — FREE WATER: 250.0000 mL | Freq: Once | Status: DC

## 2023-08-17 MED ORDER — ACETAMINOPHEN 325 MG PO TABS
ORAL_TABLET | ORAL | Status: AC
  Filled 2023-08-17: qty 2

## 2023-08-17 MED ORDER — MIDAZOLAM HCL 2 MG/2ML IJ SOLN
INTRAMUSCULAR | Status: AC
  Filled 2023-08-17: qty 2

## 2023-08-17 MED ORDER — ENOXAPARIN SODIUM 40 MG/0.4ML IJ SOSY: 40.0000 mg | PREFILLED_SYRINGE | INTRAMUSCULAR | Status: DC

## 2023-08-17 MED ORDER — SPIRONOLACTONE 25 MG PO TABS
25.0000 mg | ORAL_TABLET | Freq: Every day | ORAL | 2 refills | Status: DC
Start: 2023-08-17 — End: 2023-08-24
  Filled 2023-08-17: qty 30, 30d supply, fill #0

## 2023-08-17 MED ORDER — HEPARIN (PORCINE) IN NACL 1000-0.9 UT/500ML-% IV SOLN
INTRAVENOUS | Status: DC | PRN
Start: 2023-08-17 — End: 2023-08-17
  Administered 2023-08-17 (×2): 500 mL

## 2023-08-17 MED ORDER — DAPAGLIFLOZIN PROPANEDIOL 10 MG PO TABS
10.0000 mg | ORAL_TABLET | Freq: Every day | ORAL | 2 refills | Status: DC
  Filled 2023-08-17: qty 30, 30d supply, fill #0

## 2023-08-17 MED ORDER — FENTANYL CITRATE (PF) 100 MCG/2ML IJ SOLN
INTRAMUSCULAR | Status: AC
  Filled 2023-08-17: qty 2

## 2023-08-17 MED ORDER — MIDAZOLAM HCL 2 MG/2ML IJ SOLN
INTRAMUSCULAR | Status: DC | PRN
  Administered 2023-08-17: 1 mg via INTRAVENOUS

## 2023-08-17 MED ORDER — CARVEDILOL 12.5 MG PO TABS: 12.5000 mg | ORAL_TABLET | Freq: Two times a day (BID) | ORAL | Status: DC

## 2023-08-17 MED ORDER — ASPIRIN 81 MG PO TBEC
81.0000 mg | DELAYED_RELEASE_TABLET | Freq: Every day | ORAL | 12 refills | Status: DC
  Filled 2023-08-17: qty 30, 30d supply, fill #0

## 2023-08-17 MED ORDER — VERAPAMIL HCL 2.5 MG/ML IV SOLN
INTRAVENOUS | Status: DC | PRN
  Administered 2023-08-17: 10 mL via INTRA_ARTERIAL

## 2023-08-17 MED ORDER — ATORVASTATIN CALCIUM 40 MG PO TABS
40.0000 mg | ORAL_TABLET | Freq: Every day | ORAL | 2 refills | Status: DC
  Filled 2023-08-17: qty 30, 30d supply, fill #0

## 2023-08-17 MED ORDER — HYDRALAZINE HCL 20 MG/ML IJ SOLN: 10.0000 mg | INTRAMUSCULAR | Status: DC | PRN

## 2023-08-17 MED ORDER — FENTANYL CITRATE (PF) 100 MCG/2ML IJ SOLN
INTRAMUSCULAR | Status: DC | PRN
  Administered 2023-08-17: 25 ug via INTRAVENOUS

## 2023-08-17 MED ORDER — VERAPAMIL HCL 2.5 MG/ML IV SOLN
INTRAVENOUS | Status: AC
  Filled 2023-08-17: qty 2

## 2023-08-17 SURGICAL SUPPLY — 12 items
CATH 5FR JL3.5 JR4 ANG PIG MP (CATHETERS) IMPLANT
CATH BALLN WEDGE 5F 110CM (CATHETERS) IMPLANT
DEVICE RAD TR BAND REGULAR (VASCULAR PRODUCTS) IMPLANT
ELECT DEFIB PAD ADLT CADENCE (PAD) IMPLANT
GLIDESHEATH SLEND SS 6F .021 (SHEATH) IMPLANT
GUIDEWIRE .025 260CM (WIRE) IMPLANT
GUIDEWIRE INQWIRE 1.5J.035X260 (WIRE) IMPLANT
KIT HEART LEFT (KITS) IMPLANT
PACK CARDIAC CATHETERIZATION (CUSTOM PROCEDURE TRAY) ×2 IMPLANT
SHEATH GLIDE SLENDER 4/5FR (SHEATH) IMPLANT
SHEATH PROBE COVER 6X72 (BAG) IMPLANT
TUBING CIL FLEX 10 FLL-RA (TUBING) IMPLANT

## 2023-08-17 NOTE — Progress Notes (Signed)
 AVS completed for discharge packet; unable to print. Medications need to be reconciled.

## 2023-08-17 NOTE — Progress Notes (Addendum)
 Progress Note  Patient Name: Henry Schmitt Date of Encounter: 08/17/2023 [facility] Cardiologist: Jerel Balding, MD   Interval Summary   Shortness of breath has improved and did not require oxygen overnight.  Was able to walk multiple times throughout the day yesterday.  Denies any chest pain or worsening shortness of breath when doing this.  Vital Signs Vitals:   08/16/23 1950 08/16/23 2222 08/17/23 0431 08/17/23 0433  BP:  (!) 141/81 (!) 146/91   Pulse: 69 80 67   Resp: 20 18    Temp:  98.1 F (36.7 C) 98.2 F (36.8 C)   TempSrc:  Oral Oral   SpO2: 96%     Weight: 81.2 kg   81.4 kg  Height:        Intake/Output Summary (Last 24 hours) at 08/17/2023 9277 Last data filed at 08/17/2023 0600 Gross per 24 hour  Intake 490 ml  Output 400 ml  Net 90 ml      08/17/2023    4:33 AM 08/16/2023    7:50 PM 08/14/2023   10:43 AM  Last 3 Weights  Weight (lbs) 179 lb 8 oz 179 lb 0.2 oz 187 lb  Weight (kg) 81.421 kg 81.2 kg 84.823 kg      Telemetry/ECG  Normal sinus rhythm with resting heart rates in the 50-70's - Personally Reviewed  Physical Exam  GEN: No acute distress.   Neck: No JVD Cardiac: RRR, no murmurs, rubs, or gallops.  Respiratory: Clear to auscultation bilaterally. GI: Soft, nontender, non-distended  MS: No lower extremity edema  Assessment & Plan  Henry Schmitt is a 63 y.o. male with a hx of hypertension and tobacco use who is being seen 08/15/2023 for the evaluation of new onset HFrEF at the request of Donalda Applebaum MD.    New onset HFrEF Unstable angina Intermittent LBBB In the setting of a pontine CVA patient was also complaining of shortness of breath and chest pain on exertion. The patient's EKG also showed a new left bundle branch block.  LBBB later resolved on telemetry.  Echocardiogram showed a reduced LVEF of 30 to 35%, and wall motion abnormalities that were felt to be likely secondary to the bundle branch block.  Prior echo in 2017 showed normal  LVEF.   High-sensitivity troponins 21> 23.  The patient was felt to be volume overloaded and has received 1 dose of 20 mg IV Lasix , and 1 dose of 40 mg of IV Lasix .  I's and O's have been documented but appear to be incomplete.  Weight is down about 8 pounds but I am uncertain if the initial weight was accurate.  On exam patient appears to be more euvolemic.  Shortness of breath is improved and denies any orthopnea.  Potassium 3.9, creatinine 0.99, BUN up slightly at 24.  Ordered an add-on magnesium  With the chest pain concerning for unstable angina, and EKG changes the patient set up for a cardiac cath today.  Plan to proceed with cardiac catheterization Is already on aspirin , Plavix , and high intensity statin for CVA. Will need outpatient echocardiogram to reassess LVEF in about 3 months.   GDMT out of permissive hypertension window per neurology.  Blood pressures have remained elevated over the past day.  Most recent BP 146/91.  Heart rates in the 50s to 70s. Continue coreg  6.25 mg twice daily.  Will hold off increasing as plan to start ARNI soon. Start spirolactone 25mg  daily. Plan to start SGLT2 and ARNI after cardiac catheterization. Stop hydrochlorothiazide   25 mg daily.     Hypertension PTA was on amlodipine  2.5 mg daily and Hydrochlorothiazide  25 mg daily.  Stop these medications in favor of GDMT. Per recent note by neurology the patient is out of the window for permissive hypertension and it is okay to start GDMT.     Hyperlipidemia Goal < 70 LDL 93    HDL 41   TG 169 Continue lipitor 40 mg     CVA Presented to the emergency department with right-sided weakness and visual disturbances.  MRI indicated acute right pontine infarct.  Patient symptoms have improved.  Neurology has signed off. Continue ASA 81 mg Continue Plavix  75 mg Continue lipitor 40 mg     Bilateral carotid stenosis Seen on head and neck CT this admission. Will continue to monitor. Patient currently on  antiplatelet and statin as listed above.     Tobacco Use Patient has used about half a pack a day for 40 years Educated on cessation.    Anal fistula Had a recurrent perineal abscess that was felt to be strongly suggestive of an anal fistula. Had a prior I&D done in March. Currently has an Anorectal exam under anesthesia with fistulotomy and/or seton placement scheduled on 09/03/23. May need coordination with patient being on DAPT.   Otherwise managed per primary   For questions or updates, please contact [facility] Please consult [email or web address] for contact info under       Signed, Morse Clause, PA-C   I have seen and examined the patient along with Zane Adams, PA-C.  I have reviewed the chart, notes and new data.  I agree with PA/NP's note.  Key new complaints: No angina overnight.  Walked to the nurses station without dyspnea yesterday afternoon.  Not requiring oxygen supplementation. Key examination changes: No overt signs of hypervolemia. Key new findings / data: Renal function remains normal  PLAN: Right and left heart catheterization today.  Plan to start additional medications for heart failure after the angiography.  Informed Consent   Shared Decision Making/Informed Consent The risks [stroke (1 in 1000), death (1 in 1000), kidney failure [usually temporary] (1 in 500), bleeding (1 in 200), allergic reaction [possibly serious] (1 in 200)], benefits (diagnostic support and management of coronary artery disease) and alternatives of a cardiac catheterization were discussed in detail with Henry Schmitt and he is willing to proceed.      Quintina Hakeem, MD, FACC [facility] [phone] 08/17/2023, 9:17 AM

## 2023-08-17 NOTE — Interval H&P Note (Signed)
 History and Physical Interval Note:  08/17/2023 10:25 AM  Henry Schmitt  has presented today for surgery, with the diagnosis of heart failure.  The various methods of treatment have been discussed with the patient and family. After consideration of risks, benefits and other options for treatment, the patient has consented to  Procedure(s): RIGHT/LEFT HEART CATH AND CORONARY ANGIOGRAPHY (N/A) as a surgical intervention.  The patient's history has been reviewed, patient examined, no change in status, stable for surgery.  I have reviewed the patient's chart and labs.  Questions were answered to the patient's satisfaction.   Cath Lab Visit (complete for each Cath Lab visit)  Clinical Evaluation Leading to the Procedure:   ACS: No.  Non-ACS:    Anginal Classification: CCS I  Anti-ischemic medical therapy: Minimal Therapy (1 class of medications)  Non-Invasive Test Results: No non-invasive testing performed  Prior CABG: No previous CABG        [name] 08/17/2023 10:25 AM

## 2023-08-17 NOTE — Progress Notes (Signed)
 Patient off unit for procedure.

## 2023-08-17 NOTE — Progress Notes (Signed)
 Heart Failure Nurse Navigator Progress Note  PCP: [name], PA-C PCP-Cardiologist: Croitoru  Admission Diagnosis: Acute CVA  Admitted from: PCP office Via EMS  Presentation:   Henry Schmitt presented from PCP office with chest pain, with HTN for 3 weeks, right sided weakness with alterations in her balance and gait for 6 days. BP 142/84, HR 66, Troponin 21, EKG with Sinus rhythm Left bundle branch block, Found to have an acute stroke on MRI,Cath 8/8 with CAD , Normal LV filling pressures, Normal right heart pressures. Plan for Medical therapy.   ECHO/ LVEF: 30-35% New HFrEF   Clinical Course:  Past Medical History:  Diagnosis Date   Hypertension      Social History   Socioeconomic History   Marital status: Single    Spouse name: Not on file   Number of children: Not on file   Years of education: Not on file   Highest education level: Not on file  Occupational History   Not on file  Tobacco Use   Smoking status: Every Day    Current packs/day: 0.50    Types: Cigarettes   Smokeless tobacco: Never  Vaping Use   Vaping status: Never Used  Substance and Sexual Activity   Alcohol use: Yes    Comment: 3 times per week: brandy or beer   Drug use: Not Currently   Sexual activity: Never  Other Topics Concern   Not on file  Social History Narrative   Not on file   Social Drivers of Health   Financial Resource Strain: Low Risk  (02/12/2023)   [facility]   Overall Financial Resource Strain (CARDIA)    Difficulty of Paying Living Expenses: Not very hard  Food Insecurity: No Food Insecurity (08/15/2023)   Hunger Vital Sign    Worried About Running Out of Food in the Last Year: Never true    Ran Out of Food in the Last Year: Never true  Transportation Needs: No Transportation Needs (08/15/2023)   PRAPARE - Administrator, Civil Service (Medical): No    Lack of Transportation (Non-Medical): No  Physical Activity: Unknown (02/12/2023)    Received from [facility]   Exercise Vital Sign    On average, how many days per week do you engage in moderate to strenuous exercise (like a brisk walk)?: 0 days    Minutes of Exercise per Session: Not on file  Stress: No Stress Concern Present (02/12/2023)   Received from [facility] - Occupational Stress Questionnaire    Feeling of Stress : Only a little  Social Connections: Moderately Integrated (02/12/2023)   Received from [facility]   Social Network    How would you rate your social network (family, work, friends)?: Adequate participation with social networks   Education Assessment and Provision:  Detailed education and instructions provided on heart failure disease management including the following:  Signs and symptoms of Heart Failure When to call the physician Importance of daily weights Low sodium diet Fluid restriction Medication management Anticipated future follow-up appointments  Patient education given on each of the above topics.  Patient acknowledges understanding via teach back method and acceptance of all instructions.  Education Materials:  Living Better With Heart Failure Booklet, HF zone tool, & Daily Weight Tracker Tool.  Patient has scale at home: Yes  Patient has pill box at home: Yes    High Risk Criteria for Readmission and/or Poor Patient Outcomes: Heart failure  hospital admissions (last 6 months): 0  No Show rate: 15%  Difficult social situation: No  Demonstrates medication adherence: Yes Primary Language: English  Literacy level: Reading, writing, and comprehension.   Barriers of Care:   Diet/ fluid restrictions Daily weights  Considerations/Referrals:   Referral made to Heart Failure Pharmacist Stewardship: NA Referral made to Heart Failure CSW/NCM TOC: NA Referral made to Heart & Vascular TOC clinic: Yes , Per Aline Door, PA. On 08/24/2023 @ 9:45 am.   Items for Follow-up on  DC/TOC: Continued HF education Diet/ fluid restrictions/ daily weights.    Stephane Haddock, BSN, Scientist, clinical (histocompatibility and immunogenetics) Only

## 2023-08-17 NOTE — Telephone Encounter (Signed)
 Pharmacy Patient Advocate Encounter   Received notification from Inpatient Request that prior authorization for Farxiga  10MG  tablets is required/requested.   Insurance verification completed.   The patient is insured through [facility] .   Per test claim: PA required; PA submitted to above mentioned insurance via CoverMyMeds Key/confirmation #/EOC [id number] Status is pending

## 2023-08-17 NOTE — Discharge Summary (Addendum)
 PATIENT DETAILS Name: Henry Schmitt Age: 63 y.o. Sex: male Date of Birth: 1960/07/25 MRN: [id number]. Admitting Physician: Joette Pebbles, MD ERE:Ryjefjw, Hoy CROME, PA-C  Admit Date: 08/14/2023 Discharge date: 08/17/2023  Recommendations for Outpatient Follow-up:  Follow up with PCP in 1-2 weeks Please obtain CMP/CBC in one week Please ensure follow up with Neurology and Cardiology  Admitted From:  Home  Disposition: Home   Discharge Condition: good  CODE STATUS:   Code Status: Full Code   Diet recommendation:  Diet Order             Diet Heart Room service appropriate? Yes; Fluid consistency: Thin  Diet effective now           Diet - low sodium heart healthy                    Brief Summary: Patient is a 63 y.o.  male with history of HTN, tobacco use-who presented with unsteady gait/right-sided weakness-found to have acute CVA and new diagnosis of HFrEF.   Significant events: 8/5>> admit to [facility]   Significant studies: 8/5>> MRI brain: Acute right pontine infarct 8/5>> CTA head/neck: No LVO, 40% stenosis at the origin of the right cervical ICA, 50% stenosis origin of left cervical ICA.  Moderate stenosis left cavernous ICA. 8/5>> echo: EF 30-35%. 8/6>> LDL: 93 8/6>> A1c: 5.3   Significant microbiology data: None   Procedures: 8/8>>   1st Diag lesion is 85% stenosed.   Prox LAD to Mid LAD lesion is 20% stenosed.   1st Mrg lesion is 90% stenosed.   Mid Cx to Dist Cx lesion is 40% stenosed.   Prox RCA lesion is 50% stenosed.   Mid RCA lesion is 80% stenosed.   LV end diastolic pressure is normal   Consults: Neurology Cardiology.  Brief Hospital Course: Acute right pontine infarct Presenting symptoms have resolved Etiology small vessel disease Aspirin /Plavix   Remains on high intensity statin. Ensure outpatient follow-up with neurology   B/L carotid artery stenosis Incidental finding Probably asymptomatic In any event-on  antiplatelet/statin. Needs outpatient follow-up with vascular surgery for close monitoring.   New diagnosis of HFrEF Euvolemic Gradually started on GDMT meds-continue Coreg /lasix /aldactone /farxiga  on d/c Due to hx of angioedmea with ACEI-ARB/entresto avoided S/p LHC-although has CAD-his CHF is felt to be disproportionate to finds on LHC-thought to have non ischemic cardiomyopathy   HTN Initially permissive hypertension allowed-now on above meds Tobacco abuse Counseled   EtOH use Drinks brandy/beer 3 times weekly No evidence of withdrawal symptoms currently.   Class 1 Obesity: Estimated body mass index is 28.97 kg/m as calculated from the following:   Height as of this encounter: 5' 6 (1.676 m).   Weight as of this encounter: 81.4 kg.    Discharge Diagnoses:  Principal Problem:   Acute CVA (cerebrovascular accident) [facility]) Active Problems:   Essential hypertension, benign   LBBB (left bundle branch block)   Discharge Instructions:  Activity:  As tolerated   Discharge Instructions     (HEART FAILURE PATIENTS) Call MD:  Anytime you have any of the following symptoms: 1) 3 pound weight gain in 24 hours or 5 pounds in 1 week 2) shortness of breath, with or without a dry hacking cough 3) swelling in the hands, feet or stomach 4) if you have to sleep on extra pillows at night in order to breathe.   Complete by: As directed    Ambulatory referral to Neurology   Complete by: As directed  Follow up with stroke clinic NP at [facility] in about 4-6 weeks. Thanks.   Call MD for:  difficulty breathing, headache or visual disturbances   Complete by: As directed    Diet - low sodium heart healthy   Complete by: As directed    Discharge instructions   Complete by: As directed    Follow with Primary MD  [name], PA-C in 1-2 weeks  Please get a complete blood count and chemistry panel checked by your Primary MD at your next visit, and again as instructed by your Primary  MD.  Get Medicines reviewed and adjusted: Please take all your medications with you for your next visit with your Primary MD  Laboratory/radiological data: Please request your Primary MD to go over all hospital tests and procedure/radiological results at the follow up, please ask your Primary MD to get all Hospital records sent to his/her office.  In some cases, they will be blood work, cultures and biopsy results pending at the time of your discharge. Please request that your primary care M.D. follows up on these results.  Also Note the following: If you experience worsening of your admission symptoms, develop shortness of breath, life threatening emergency, suicidal or homicidal thoughts you must seek medical attention immediately by calling 911 or calling your MD immediately  if symptoms less severe.  You must read complete instructions/literature along with all the possible adverse reactions/side effects for all the Medicines you take and that have been prescribed to you. Take any new Medicines after you have completely understood and accpet all the possible adverse reactions/side effects.   Do not drive when taking Pain medications or sleeping medications (Benzodaizepines)  Do not take more than prescribed Pain, Sleep and Anxiety Medications. It is not advisable to combine anxiety,sleep and pain medications without talking with your primary care practitioner  Special Instructions: If you have smoked or chewed Tobacco  in the last 2 yrs please stop smoking, stop any regular Alcohol  and or any Recreational drug use.  Wear Seat belts while driving.  Please note: You were cared for by a hospitalist during your hospital stay. Once you are discharged, your primary care physician will handle any further medical issues. Please note that NO REFILLS for any discharge medications will be authorized once you are discharged, as it is imperative that you return to your primary care physician (or  establish a relationship with a primary care physician if you do not have one) for your post hospital discharge needs so that they can reassess your need for medications and monitor your lab values.   Increase activity slowly   Complete by: As directed       Allergies as of 08/17/2023       Reactions   Peanut-containing Drug Products Swelling   ALL TREE NUTS   Antihistamines, Chlorpheniramine-type Hives, Swelling   Lisinopril Swelling        Medication List     STOP taking these medications    amLODipine  2.5 MG tablet Commonly known as: NORVASC    hydrochlorothiazide  25 MG tablet Commonly known as: HYDRODIURIL        TAKE these medications    aspirin  EC 81 MG tablet Take 1 tablet (81 mg total) by mouth daily. Swallow whole. Start taking on: August 18, 2023   atorvastatin  40 MG tablet Commonly known as: LIPITOR Take 1 tablet (40 mg total) by mouth daily. Start taking on: August 18, 2023   carvedilol  12.5 MG tablet Commonly known as:  COREG  Take 1 tablet (12.5 mg total) by mouth 2 (two) times daily with a meal.   clopidogrel  75 MG tablet Commonly known as: PLAVIX  Take 1 tablet (75 mg total) by mouth daily. Start taking on: August 18, 2023   dapagliflozin  propanediol 10 MG Tabs tablet Commonly known as: FARXIGA  Take 1 tablet (10 mg total) by mouth daily. Start taking on: August 18, 2023   furosemide  40 MG tablet Commonly known as: Lasix  Take 1 tablet (40 mg total) by mouth daily.   ibuprofen  200 MG tablet Commonly known as: ADVIL  Take 200-600 mg by mouth every 6 (six) hours as needed.   spironolactone  25 MG tablet Commonly known as: Aldactone  Take 1 tablet (25 mg total) by mouth daily.   zolpidem 10 MG tablet Commonly known as: AMBIEN Take 10 mg by mouth at bedtime as needed.               Durable Medical Equipment  (From admission, onward)           Start     Ordered   08/15/23 1404  For home use only DME Walker rolling  Once        Question Answer Comment  Walker: With 5 Inch Wheels   Patient needs a walker to treat with the following condition CVA (cerebral vascular accident) (HCC)      08/15/23 1403            Follow-up Information     [facility]. Schedule an appointment as soon as possible for a visit in 1 month(s).   Specialty: Neurology Why: stroke clinic Contact information: [address] [place]  [address] [phone]        [facility]. Go in 6 day(s).   Specialty: Cardiology Why: Hospital follow up 08/24/2023 @ 9:45 am PLEASE bring a current medication list to appointment FREE valet parking, Entrance C, [address] [facility] for [facility] and [facility] information: [address] [place]  [address] [phone]        [name], [place]. Schedule an appointment as soon as possible for a visit in 1 week(s).   Specialty: Physician Assistant Contact information: [address] [place] [address] [phone]         [facility] at [facility]. Schedule an appointment as soon as possible for a visit.   Specialty: Rehabilitation Why: Should you not get a call from Outpatient Rehabilitation  within 48 hrs please call them at [phone] Contact information: [address] [place]  [address] [phone]               Allergies  Allergen Reactions   Peanut-Containing Drug Products Swelling    ALL TREE NUTS   Antihistamines, Chlorpheniramine-Type Hives and Swelling   Lisinopril Swelling     Other Procedures/Studies: CARDIAC CATHETERIZATION Addendum Date: 08/17/2023   1st Diag lesion is 85% stenosed.   Prox LAD to Mid LAD lesion is 20% stenosed.   1st Mrg lesion is 90% stenosed.   Mid Cx to Dist Cx lesion is 40% stenosed.   Prox RCA lesion is 50%  stenosed.   Mid RCA lesion is 80% stenosed.   LV end diastolic pressure is normal. CAD as noted. Normal LV filling pressures. LVEDP 4 mm Hg, PCWP 11/9, mean 6 mm Hg Normal right heart pressures. PAP 25/9, mean 15 mm Hg  Cardiac output 4.29 L/min, index 2.25 Plan: recommend medical therapy. He denies any angina and degree of LV dysfunction is out of proportion to his CAD  Result Date: 08/17/2023   1st Diag lesion is 85% stenosed.   Prox LAD to Mid LAD lesion is 20% stenosed.   1st Mrg lesion is 90% stenosed.   Mid Cx to Dist Cx lesion is 40% stenosed.   Prox RCA lesion is 50% stenosed.   Mid RCA lesion is 80% stenosed.   LV end diastolic pressure is normal. CAD as noted. Normal LV filling pressures. LVEDP 4 mm Hg, PCWP 11/9, mean 6 mm Hg Normal right heart pressures. PAP 25/9, mean 15 mm Hg Cardiac output 4.29 L/min, index 2.25 Plan: recommend medical therapy. She denies any angina and degree of LV dysfunction is out of proportion to her CAD   CT ANGIO HEAD NECK W WO CM Result Date: 08/14/2023 EXAM: CTA HEAD AND NECK WITHOUT AND WITH 08/14/2023 07:23:00 PM TECHNIQUE: CTA of the head and neck was performed without and with the administration of intravenous contrast. Multiplanar 2D and/or 3D reformatted images are provided for review. Automated exposure control, iterative reconstruction, and/or weight based adjustment of the mA/kV was utilized to reduce the radiation dose to as low as reasonably achievable. Stenosis of the internal carotid arteries measured using NASCET criteria. COMPARISON: MRI head earlier same day and CT head on 10/31/2022. CLINICAL HISTORY: 63 year old male with past medical history of hypertension and hyperlipidemia presenting to the emergency department with chest pressure, right-sided weakness, and dizziness. Concern for possible CVA. FINDINGS: CTA NECK: AORTIC ARCH AND ARCH VESSELS: Mild atherosclerosis of the aortic arch. Common origin of the brachiocephalic and left common carotid arteries.  Mild atherosclerosis of the bilateral subclavian arteries without high-grade stenosis. CERVICAL CAROTID ARTERIES: Mixed atherosclerotic plaque at the right carotid bifurcation resulting in approximately 40% stenosis at the origin of the right cervical ICA. Additional mixed atherosclerotic plaque at the left carotid bifurcation resulting in approximately 50% stenosis at the origin of the left cervical ICA. CERVICAL VERTEBRAL ARTERIES: Patent from the origins to the vertebrobasilar confluence. Tortuosity of the right V1 segment. Mild focal atherosclerosis of the proximal left V2 segment resulting in moderate stenosis. LUNGS AND MEDIASTINUM: Unremarkable. SOFT TISSUES: No acute abnormality. BONES: No acute abnormality. CTA HEAD: ANTERIOR CIRCULATION: Prominent calcified atherosclerosis of the carotid siphons. Moderate stenosis of the distal left cavernous ICA and additional mild-to-moderate stenosis of the right cavernous ICA. The MCAs are patent bilaterally. The ACAs are patent bilaterally. POSTERIOR CIRCULATION: No significant stenosis of the posterior cerebral arteries. No significant stenosis of the basilar artery. No significant stenosis of the vertebral arteries. No aneurysm. OTHER: Region of infarct in the right pons noted on the same day MRI is not well visualized on the current CT. No dural venous sinus thrombosis on this non-dedicated study. IMPRESSION: 1. No large vessel occlusion in the head or neck. 2. Mixed atherosclerotic plaque at the right carotid bifurcation resulting in approximately 40% stenosis at the origin of the right cervical ICA. 3. Mixed atherosclerotic plaque at the left carotid bifurcation resulting in approximately 50% stenosis at the origin of the left cervical ICA. 4. Moderate stenosis of the distal left cavernous ICA and additional mild-to-moderate stenosis of the right cavernous ICA. 5. Focal atherosclerosis of the proximal left V2 segment resulting in moderate stenosis. Electronically  signed by: Donnice Mania MD 08/14/2023 08:46 PM EDT RP Workstation: [id number]   ECHOCARDIOGRAM COMPLETE Result Date: 08/14/2023    ECHOCARDIOGRAM REPORT  Patient Name:   AYREN ZUMBRO Date of Exam: 08/14/2023 Medical Rec #:  [id number]    Height:       66.0 in Accession #:    [id number]   Weight:       187.0 lb Date of Birth:  1960-04-04   BSA:          1.943 m Patient Age:    62 years     BP:           151/86 mmHg Patient Gender: M            HR:           67 bpm. Exam Location:  Inpatient Procedure: 2D Echo (Both Spectral and Color Flow Doppler were utilized during            procedure). Indications:    stroke  History:        Patient has prior history of Echocardiogram examinations, most                 recent 04/19/2015. Arrythmias:LBBB; Risk Factors:Current Smoker                 and Hypertension.  Sonographer:    Tinnie Barefoot RDCS Referring Phys: [id number] JOETTE PEBBLES IMPRESSIONS  1. Global hypokinesis worse in the inferior wall . Left ventricular ejection fraction, by estimation, is 30 to 35%. The left ventricle has moderately decreased function. The left ventricle demonstrates global hypokinesis. The left ventricular internal cavity size was mildly dilated. There is mild left ventricular hypertrophy. Left ventricular diastolic parameters are consistent with Grade I diastolic dysfunction (impaired relaxation).  2. Right ventricular systolic function is normal. The right ventricular size is normal.  3. The mitral valve is abnormal. No evidence of mitral valve regurgitation. No evidence of mitral stenosis.  4. The aortic valve is tricuspid. There is mild calcification of the aortic valve. There is mild thickening of the aortic valve. Aortic valve regurgitation is not visualized. Aortic valve sclerosis is present, with no evidence of aortic valve stenosis.  5. The inferior vena cava is normal in size with greater than 50% respiratory variability, suggesting right atrial pressure of 3 mmHg. FINDINGS  Left  Ventricle: Global hypokinesis worse in the inferior wall. Left ventricular ejection fraction, by estimation, is 30 to 35%. The left ventricle has moderately decreased function. The left ventricle demonstrates global hypokinesis. Strain was performed and the global longitudinal strain is indeterminate. The left ventricular internal cavity size was mildly dilated. There is mild left ventricular hypertrophy. Left ventricular diastolic parameters are consistent with Grade I diastolic dysfunction (impaired relaxation). Right Ventricle: The right ventricular size is normal. No increase in right ventricular wall thickness. Right ventricular systolic function is normal. Left Atrium: Left atrial size was normal in size. Right Atrium: Right atrial size was normal in size. Pericardium: There is no evidence of pericardial effusion. Mitral Valve: The mitral valve is abnormal. There is mild thickening of the mitral valve leaflet(s). No evidence of mitral valve regurgitation. No evidence of mitral valve stenosis. Tricuspid Valve: The tricuspid valve is normal in structure. Tricuspid valve regurgitation is trivial. No evidence of tricuspid stenosis. Aortic Valve: The aortic valve is tricuspid. There is mild calcification of the aortic valve. There is mild thickening of the aortic valve. Aortic valve regurgitation is not visualized. Aortic valve sclerosis is present, with no evidence of aortic valve stenosis. Pulmonic Valve: The pulmonic valve was normal in structure. Pulmonic valve regurgitation is trivial. No evidence of  pulmonic stenosis. Aorta: The aortic root is normal in size and structure. Venous: The inferior vena cava is normal in size with greater than 50% respiratory variability, suggesting right atrial pressure of 3 mmHg. IAS/Shunts: No atrial level shunt detected by color flow Doppler. Additional Comments: 3D was performed not requiring image post processing on an independent workstation and was indeterminate.  LEFT  VENTRICLE PLAX 2D LVIDd:         4.80 cm     Diastology LVIDs:         4.10 cm     LV e' medial:    4.35 cm/s LV PW:         1.20 cm     LV E/e' medial:  12.6 LV IVS:        1.30 cm     LV e' lateral:   4.68 cm/s LVOT diam:     2.10 cm     LV E/e' lateral: 11.7 LV SV:         48 LV SV Index:   25 LVOT Area:     3.46 cm  LV Volumes (MOD) LV vol d, MOD A2C: 97.6 ml LV vol d, MOD A4C: 88.6 ml LV vol s, MOD A2C: 64.4 ml LV vol s, MOD A4C: 59.1 ml LV SV MOD A2C:     33.2 ml LV SV MOD A4C:     88.6 ml LV SV MOD BP:      31.8 ml RIGHT VENTRICLE             IVC RV Basal diam:  2.50 cm     IVC diam: 1.30 cm RV S prime:     11.70 cm/s TAPSE (M-mode): 1.6 cm LEFT ATRIUM             Index        RIGHT ATRIUM          Index LA diam:        3.30 cm 1.70 cm/m   RA Area:     9.55 cm LA Vol (A2C):   28.4 ml 14.61 ml/m  RA Volume:   17.40 ml 8.95 ml/m LA Vol (A4C):   26.9 ml 13.84 ml/m LA Biplane Vol: 28.1 ml 14.46 ml/m  AORTIC VALVE LVOT Vmax:   76.50 cm/s LVOT Vmean:  46.000 cm/s LVOT VTI:    0.140 m  AORTA Ao Root diam: 2.90 cm Ao Asc diam:  3.00 cm MITRAL VALVE MV Area (PHT): 3.65 cm    SHUNTS MV Decel Time: 208 msec    Systemic VTI:  0.14 m MV E velocity: 54.70 cm/s  Systemic Diam: 2.10 cm MV A velocity: 91.00 cm/s MV E/A ratio:  0.60 Maude Emmer MD Electronically signed by Maude Emmer MD Signature Date/Time: 08/14/2023/6:09:55 PM    Final    DG Chest Port 1 View Result Date: 08/14/2023 EXAM: 1 VIEW XRAY OF THE CHEST 08/14/2023 12:38:00 PM COMPARISON: 08/15/2020 CLINICAL HISTORY: CP. Reason for exam: CP; Triage notes: Pt BIB EMS from PCP office with CC of CP with HTN x 3 weeks. Pt also reported right sided weakness with alterations in balance and gait as a result x 6 days. Aox4. FINDINGS: LUNGS AND PLEURA: No focal pulmonary opacity. No pulmonary edema. No pleural effusion. No pneumothorax. HEART AND MEDIASTINUM: Borderline cardiomegaly. No acute abnormality of the mediastinal silhouette. BONES AND SOFT TISSUES: No  acute osseous abnormality. Multiple wires and leads project over the chest on the frontal radiograph. IMPRESSION: 1. No acute  findings. 2. Borderline cardiomegaly. Electronically signed by: Rockey Kilts MD 08/14/2023 01:43 PM EDT RP Workstation: [id number]   MR BRAIN WO CONTRAST Result Date: 08/14/2023 CLINICAL DATA:  Right-sided weakness with balance alteration EXAM: MRI HEAD WITHOUT CONTRAST TECHNIQUE: Multiplanar, multiecho pulse sequences of the brain and surrounding structures were obtained without intravenous contrast. COMPARISON:  April 09, 2014 FINDINGS: MRI brain: There is acute infarct in the right side of the pons. There are several foci of T2 hyperintensity in the cerebral white matter. These do not have restricted diffusion. The ventricles are normal. No mass lesion. There are normal flow signals in the carotid arteries and basilar artery. No significant bone marrow signal abnormality. No significant abnormality in the paranasal sinuses or soft tissues. IMPRESSION: Acute right pontine infarct Electronically Signed   By: Nancyann Burns M.D.   On: 08/14/2023 11:53     TODAY-DAY OF DISCHARGE:  Subjective:   Henry Schmitt today has no headache,no chest abdominal pain,no new weakness tingling or numbness, feels much better wants to go home today.   Objective:   Blood pressure (!) 169/92, pulse 71, temperature 98 F (36.7 C), temperature source Oral, resp. rate 16, height 5' 6 (1.676 m), weight 81.4 kg, SpO2 100%.  Intake/Output Summary (Last 24 hours) at 08/17/2023 1501 Last data filed at 08/17/2023 0600 Gross per 24 hour  Intake 490 ml  Output 400 ml  Net 90 ml   Filed Weights   08/14/23 1043 08/16/23 1950 08/17/23 0433  Weight: 84.8 kg 81.2 kg 81.4 kg    Exam: Awake Alert, Oriented *3, No new F.N deficits, Normal affect [place].AT,PERRAL Supple Neck,No JVD, No cervical lymphadenopathy appriciated.  Symmetrical Chest wall movement, Good air movement bilaterally, CTAB RRR,No  Gallops,Rubs or new Murmurs, No Parasternal Heave +ve B.Sounds, Abd Soft, Non tender, No organomegaly appriciated, No rebound -guarding or rigidity. No Cyanosis, Clubbing or edema, No new Rash or bruise   PERTINENT RADIOLOGIC STUDIES: CARDIAC CATHETERIZATION Addendum Date: 08/17/2023   1st Diag lesion is 85% stenosed.   Prox LAD to Mid LAD lesion is 20% stenosed.   1st Mrg lesion is 90% stenosed.   Mid Cx to Dist Cx lesion is 40% stenosed.   Prox RCA lesion is 50% stenosed.   Mid RCA lesion is 80% stenosed.   LV end diastolic pressure is normal. CAD as noted. Normal LV filling pressures. LVEDP 4 mm Hg, PCWP 11/9, mean 6 mm Hg Normal right heart pressures. PAP 25/9, mean 15 mm Hg Cardiac output 4.29 L/min, index 2.25 Plan: recommend medical therapy. He denies any angina and degree of LV dysfunction is out of proportion to his CAD  Result Date: 08/17/2023   1st Diag lesion is 85% stenosed.   Prox LAD to Mid LAD lesion is 20% stenosed.   1st Mrg lesion is 90% stenosed.   Mid Cx to Dist Cx lesion is 40% stenosed.   Prox RCA lesion is 50% stenosed.   Mid RCA lesion is 80% stenosed.   LV end diastolic pressure is normal. CAD as noted. Normal LV filling pressures. LVEDP 4 mm Hg, PCWP 11/9, mean 6 mm Hg Normal right heart pressures. PAP 25/9, mean 15 mm Hg Cardiac output 4.29 L/min, index 2.25 Plan: recommend medical therapy. She denies any angina and degree of LV dysfunction is out of proportion to her CAD     PERTINENT LAB RESULTS: CBC: Recent Labs    08/15/23 0559 08/17/23 1044 08/17/23 1051 08/17/23 1052  WBC 12.1*  --   --   --  HGB 13.6   < > 13.9 13.9  HCT 42.4   < > 41.0 41.0  PLT 298  --   --   --    < > = values in this interval not displayed.   CMET CMP     Component Value Date/Time   NA 138 08/17/2023 1052   NA 140 05/07/2014 1054   K 3.9 08/17/2023 1052   K 4.1 05/07/2014 1054   CL 102 08/17/2023 0421   CO2 25 08/17/2023 0421   CO2 20 (L) 05/07/2014 1054   GLUCOSE 125 (H)  08/17/2023 0421   GLUCOSE 117 05/07/2014 1054   BUN 24 (H) 08/17/2023 0421   BUN 9.2 05/07/2014 1054   CREATININE 0.99 08/17/2023 0421   CREATININE 0.7 05/07/2014 1054   CALCIUM  9.5 08/17/2023 0421   CALCIUM  9.1 05/07/2014 1054   PROT 6.7 08/15/2023 0559   PROT 7.0 05/07/2014 1054   ALBUMIN 3.3 (L) 08/15/2023 0559   ALBUMIN 3.6 05/07/2014 1054   AST 24 08/15/2023 0559   AST 39 (H) 05/07/2014 1054   ALT 27 08/15/2023 0559   ALT 89 (H) 05/07/2014 1054   ALKPHOS 97 08/15/2023 0559   ALKPHOS 165 (H) 05/07/2014 1054   BILITOT 0.3 08/15/2023 0559   BILITOT 0.23 05/07/2014 1054   EGFR >90 05/07/2014 1054   GFRNONAA >60 08/17/2023 0421    GFR Estimated Creatinine Clearance: 76.5 mL/min (by C-G formula based on SCr of 0.99 mg/dL). No results for input(s): LIPASE, AMYLASE in the last 72 hours. No results for input(s): CKTOTAL, CKMB, CKMBINDEX, TROPONINI in the last 72 hours. Invalid input(s): POCBNP No results for input(s): DDIMER in the last 72 hours. Recent Labs    08/15/23 0559  HGBA1C 5.3   Recent Labs    08/15/23 0559  CHOL 168  HDL 41  LDLCALC 93  TRIG 169*  CHOLHDL 4.1   No results for input(s): TSH, T4TOTAL, T3FREE, THYROIDAB in the last 72 hours.  Invalid input(s): FREET3 No results for input(s): VITAMINB12, FOLATE, FERRITIN, TIBC, IRON, RETICCTPCT in the last 72 hours. Coags: No results for input(s): INR in the last 72 hours.  Invalid input(s): PT Microbiology: No results found for this or any previous visit (from the past 240 hours).  FURTHER DISCHARGE INSTRUCTIONS:  Get Medicines reviewed and adjusted: Please take all your medications with you for your next visit with your Primary MD  Laboratory/radiological data: Please request your Primary MD to go over all hospital tests and procedure/radiological results at the follow up, please ask your Primary MD to get all Hospital records sent to his/her office.  In  some cases, they will be blood work, cultures and biopsy results pending at the time of your discharge. Please request that your primary care M.D. goes through all the records of your hospital data and follows up on these results.  Also Note the following: If you experience worsening of your admission symptoms, develop shortness of breath, life threatening emergency, suicidal or homicidal thoughts you must seek medical attention immediately by calling 911 or calling your MD immediately  if symptoms less severe.  You must read complete instructions/literature along with all the possible adverse reactions/side effects for all the Medicines you take and that have been prescribed to you. Take any new Medicines after you have completely understood and accpet all the possible adverse reactions/side effects.   Do not drive when taking Pain medications or sleeping medications (Benzodaizepines)  Do not take more than prescribed Pain, Sleep and Anxiety  Medications. It is not advisable to combine anxiety,sleep and pain medications without talking with your primary care practitioner  Special Instructions: If you have smoked or chewed Tobacco  in the last 2 yrs please stop smoking, stop any regular Alcohol  and or any Recreational drug use.  Wear Seat belts while driving.  Please note: You were cared for by a hospitalist during your hospital stay. Once you are discharged, your primary care physician will handle any further medical issues. Please note that NO REFILLS for any discharge medications will be authorized once you are discharged, as it is imperative that you return to your primary care physician (or establish a relationship with a primary care physician if you do not have one) for your post hospital discharge needs so that they can reassess your need for medications and monitor your lab values.  Total Time spent coordinating discharge including counseling, education and face to face time equals greater  than 30 minutes.  Signed: Samay Delcarlo 08/17/2023 3:01 PM

## 2023-08-17 NOTE — H&P (View-Only) (Signed)
 Progress Note  Patient Name: Henry Schmitt Date of Encounter: 08/17/2023 [facility] Cardiologist: Jerel Balding, MD   Interval Summary   Shortness of breath has improved and did not require oxygen overnight.  Was able to walk multiple times throughout the day yesterday.  Denies any chest pain or worsening shortness of breath when doing this.  Vital Signs Vitals:   08/16/23 1950 08/16/23 2222 08/17/23 0431 08/17/23 0433  BP:  (!) 141/81 (!) 146/91   Pulse: 69 80 67   Resp: 20 18    Temp:  98.1 F (36.7 C) 98.2 F (36.8 C)   TempSrc:  Oral Oral   SpO2: 96%     Weight: 81.2 kg   81.4 kg  Height:        Intake/Output Summary (Last 24 hours) at 08/17/2023 9277 Last data filed at 08/17/2023 0600 Gross per 24 hour  Intake 490 ml  Output 400 ml  Net 90 ml      08/17/2023    4:33 AM 08/16/2023    7:50 PM 08/14/2023   10:43 AM  Last 3 Weights  Weight (lbs) 179 lb 8 oz 179 lb 0.2 oz 187 lb  Weight (kg) 81.421 kg 81.2 kg 84.823 kg      Telemetry/ECG  Normal sinus rhythm with resting heart rates in the 50-70's - Personally Reviewed  Physical Exam  GEN: No acute distress.   Neck: No JVD Cardiac: RRR, no murmurs, rubs, or gallops.  Respiratory: Clear to auscultation bilaterally. GI: Soft, nontender, non-distended  MS: No lower extremity edema  Assessment & Plan  Henry Schmitt is a 63 y.o. male with a hx of hypertension and tobacco use who is being seen 08/15/2023 for the evaluation of new onset HFrEF at the request of Donalda Applebaum MD.    New onset HFrEF Unstable angina Intermittent LBBB In the setting of a pontine CVA patient was also complaining of shortness of breath and chest pain on exertion. The patient's EKG also showed a new left bundle branch block.  LBBB later resolved on telemetry.  Echocardiogram showed a reduced LVEF of 30 to 35%, and wall motion abnormalities that were felt to be likely secondary to the bundle branch block.  Prior echo in 2017 showed normal  LVEF.   High-sensitivity troponins 21> 23.  The patient was felt to be volume overloaded and has received 1 dose of 20 mg IV Lasix , and 1 dose of 40 mg of IV Lasix .  I's and O's have been documented but appear to be incomplete.  Weight is down about 8 pounds but I am uncertain if the initial weight was accurate.  On exam patient appears to be more euvolemic.  Shortness of breath is improved and denies any orthopnea.  Potassium 3.9, creatinine 0.99, BUN up slightly at 24.  Ordered an add-on magnesium  With the chest pain concerning for unstable angina, and EKG changes the patient set up for a cardiac cath today.  Plan to proceed with cardiac catheterization Is already on aspirin , Plavix , and high intensity statin for CVA. Will need outpatient echocardiogram to reassess LVEF in about 3 months.   GDMT out of permissive hypertension window per neurology.  Blood pressures have remained elevated over the past day.  Most recent BP 146/91.  Heart rates in the 50s to 70s. Continue coreg  6.25 mg twice daily.  Will hold off increasing as plan to start ARNI soon. Start spirolactone 25mg  daily. Plan to start SGLT2 and ARNI after cardiac catheterization. Stop hydrochlorothiazide   25 mg daily.     Hypertension PTA was on amlodipine  2.5 mg daily and Hydrochlorothiazide  25 mg daily.  Stop these medications in favor of GDMT. Per recent note by neurology the patient is out of the window for permissive hypertension and it is okay to start GDMT.     Hyperlipidemia Goal < 70 LDL 93    HDL 41   TG 169 Continue lipitor 40 mg     CVA Presented to the emergency department with right-sided weakness and visual disturbances.  MRI indicated acute right pontine infarct.  Patient symptoms have improved.  Neurology has signed off. Continue ASA 81 mg Continue Plavix  75 mg Continue lipitor 40 mg     Bilateral carotid stenosis Seen on head and neck CT this admission. Will continue to monitor. Patient currently on  antiplatelet and statin as listed above.     Tobacco Use Patient has used about half a pack a day for 40 years Educated on cessation.    Anal fistula Had a recurrent perineal abscess that was felt to be strongly suggestive of an anal fistula. Had a prior I&D done in March. Currently has an Anorectal exam under anesthesia with fistulotomy and/or seton placement scheduled on 09/03/23. May need coordination with patient being on DAPT.   Otherwise managed per primary   For questions or updates, please contact [facility] Please consult [email or web address] for contact info under       Signed, Morse Clause, PA-C   I have seen and examined the patient along with Zane Adams, PA-C.  I have reviewed the chart, notes and new data.  I agree with PA/NP's note.  Key new complaints: No angina overnight.  Walked to the nurses station without dyspnea yesterday afternoon.  Not requiring oxygen supplementation. Key examination changes: No overt signs of hypervolemia. Key new findings / data: Renal function remains normal  PLAN: Right and left heart catheterization today.  Plan to start additional medications for heart failure after the angiography.  Informed Consent   Shared Decision Making/Informed Consent The risks [stroke (1 in 1000), death (1 in 1000), kidney failure [usually temporary] (1 in 500), bleeding (1 in 200), allergic reaction [possibly serious] (1 in 200)], benefits (diagnostic support and management of coronary artery disease) and alternatives of a cardiac catheterization were discussed in detail with Henry Schmitt and he is willing to proceed.      Quintina Hakeem, MD, FACC [facility] [phone] 08/17/2023, 9:17 AM

## 2023-08-17 NOTE — Progress Notes (Signed)
 PROGRESS NOTE        PATIENT DETAILS Name: Henry Schmitt Age: 63 y.o. Sex: male Date of Birth: May 14, 1960 Admit Date: 08/14/2023 Admitting Physician Joette Pebbles, MD ERE:Ryjefjw, Hoy CROME, PA-C  Brief Summary: Patient is a 63 y.o.  male with history of HTN, tobacco use-who presented with unsteady gait/right-sided weakness-found to have acute CVA and new diagnosis of HFrEF.  Significant events: 8/5>> admit to [facility]  Significant studies: 8/5>> MRI brain: Acute right pontine infarct 8/5>> CTA head/neck: No LVO, 40% stenosis at the origin of the right cervical ICA, 50% stenosis origin of left cervical ICA.  Moderate stenosis left cavernous ICA. 8/5>> echo: EF 30-35%. 8/6>> LDL: 93 8/6>> A1c: 5.3  Significant microbiology data: None  Procedures: None  Consults: Neurology Cardiology.  Subjective: Lying comfortably bed-no chest pain or shortness of breath.  Objective: Vitals: Blood pressure (!) 146/91, pulse 67, temperature 98.2 F (36.8 C), temperature source Oral, resp. rate 18, height 5' 6 (1.676 m), weight 81.4 kg, SpO2 100%.   Exam: Awake/alert Chest: Clear to auscultation CVS: S1-S2 regular Abdomen: Soft nontender nondistended Nonfocal exam.  Pertinent Labs/Radiology:    Latest Ref Rng & Units 08/15/2023    5:59 AM 08/14/2023   10:46 AM 10/11/2022    6:00 PM  CBC  WBC 4.0 - 10.5 K/uL 12.1  15.6  15.1   Hemoglobin 13.0 - 17.0 g/dL 86.3  85.6  86.3   Hematocrit 39.0 - 52.0 % 42.4  44.7  43.0   Platelets 150 - 400 K/uL 298  339  344     Lab Results  Component Value Date   NA 136 08/17/2023   K 3.9 08/17/2023   CL 102 08/17/2023   CO2 25 08/17/2023      Assessment/Plan: Acute right pontine infarct Presenting symptoms have resolved Etiology small vessel disease Aspirin /Plavix  x 3 weeks followed by aspirin  alone but regimen may need to be adjusted based on LHC. Remains on high intensity statin. Ensure outpatient follow-up with  neurology  B/L carotid artery stenosis Incidental finding Probably asymptomatic In any event-on antiplatelet/statin. Needs outpatient follow-up with vascular surgery for close monitoring.  Unstable angina Has history of exertional chest pain/dyspnea.   LBBB on EKG Echo with reduced EF On antiplatelets LHC/RHC scheduled for 8/8.  New diagnosis of HFrEF Euvolemic GDMT medications being slowly initiated by cardiology.  HTN Initially permissive hypertension allowed-now on Coreg /Aldactone .  Tobacco abuse Counseled  EtOH use Drinks brandy/beer 3 times weekly No evidence of withdrawal symptoms currently.  Class 1 Obesity: Estimated body mass index is 28.97 kg/m as calculated from the following:   Height as of this encounter: 5' 6 (1.676 m).   Weight as of this encounter: 81.4 kg.   Code status:   Code Status: Full Code   DVT Prophylaxis: enoxaparin  (LOVENOX ) injection 40 mg Start: 08/14/23 1600   Family Communication: None at bedside   Disposition Plan: Status is: Observation The patient remains OBS appropriate and will d/c before 2 midnights.   Planned Discharge Destination:Home   Diet: Diet Order             Diet NPO time specified Except for: Sips with Meds  Diet effective 0500                     Antimicrobial agents: Anti-infectives (From admission, onward)    None  MEDICATIONS: Scheduled Meds:  [MAR Hold] aspirin  EC  81 mg Oral Daily   [MAR Hold] atorvastatin   40 mg Oral Daily   [MAR Hold] carvedilol   6.25 mg Oral BID WC   [MAR Hold] clopidogrel   75 mg Oral Daily   [MAR Hold] enoxaparin  (LOVENOX ) injection  40 mg Subcutaneous Q24H   [MAR Hold] nitroGLYCERIN   0.5 inch Topical Once   [MAR Hold] spironolactone   25 mg Oral Daily   Continuous Infusions:   PRN Meds:.[MAR Hold] acetaminophen  **OR** [MAR Hold] acetaminophen  (TYLENOL ) oral liquid 160 mg/5 mL **OR** [MAR Hold] acetaminophen , fentaNYL , Heparin  (Porcine) in NaCl,  midazolam , Radial Cocktail/Verapamil  only   I have personally reviewed following labs and imaging studies  LABORATORY DATA: CBC: Recent Labs  Lab 08/14/23 1046 08/15/23 0559  WBC 15.6* 12.1*  HGB 14.3 13.6  HCT 44.7 42.4  MCV 94.5 93.8  PLT 339 298    Basic Metabolic Panel: Recent Labs  Lab 08/14/23 1046 08/15/23 0559 08/17/23 0421  NA 134* 135 136  K 3.9 3.8 3.9  CL 99 101 102  CO2 24 26 25   GLUCOSE 114* 129* 125*  BUN 19 16 24*  CREATININE 0.86 0.98 0.99  CALCIUM  9.4 9.1 9.5  MG  --   --  2.1    GFR: Estimated Creatinine Clearance: 76.5 mL/min (by C-G formula based on SCr of 0.99 mg/dL).  Liver Function Tests: Recent Labs  Lab 08/15/23 0559  AST 24  ALT 27  ALKPHOS 97  BILITOT 0.3  PROT 6.7  ALBUMIN 3.3*   No results for input(s): LIPASE, AMYLASE in the last 168 hours. No results for input(s): AMMONIA in the last 168 hours.  Coagulation Profile: No results for input(s): INR, PROTIME in the last 168 hours.  Cardiac Enzymes: No results for input(s): CKTOTAL, CKMB, CKMBINDEX, TROPONINI in the last 168 hours.  BNP (last 3 results) No results for input(s): PROBNP in the last 8760 hours.  Lipid Profile: Recent Labs    08/15/23 0559  CHOL 168  HDL 41  LDLCALC 93  TRIG 169*  CHOLHDL 4.1    Thyroid Function Tests: No results for input(s): TSH, T4TOTAL, FREET4, T3FREE, THYROIDAB in the last 72 hours.  Anemia Panel: No results for input(s): VITAMINB12, FOLATE, FERRITIN, TIBC, IRON, RETICCTPCT in the last 72 hours.  Urine analysis:    Component Value Date/Time   COLORURINE YELLOW 10/31/2019 0920   APPEARANCEUR CLEAR 10/31/2019 0920   LABSPEC 1.018 10/31/2019 0920   PHURINE 5.0 10/31/2019 0920   GLUCOSEU NEGATIVE 10/31/2019 0920   HGBUR NEGATIVE 10/31/2019 0920   BILIRUBINUR NEGATIVE 10/31/2019 0920   KETONESUR NEGATIVE 10/31/2019 0920   PROTEINUR NEGATIVE 10/31/2019 0920   UROBILINOGEN 0.2  02/10/2008 0229   NITRITE NEGATIVE 10/31/2019 0920   LEUKOCYTESUR SMALL (A) 10/31/2019 0920    Sepsis Labs: Lactic Acid, Venous    Component Value Date/Time   LATICACIDVEN 1.43 02/03/2016 2127    MICROBIOLOGY: No results found for this or any previous visit (from the past 240 hours).  RADIOLOGY STUDIES/RESULTS: No results found.    LOS: 1 day   Donalda Applebaum, MD  [facility]    To contact the attending provider between 7A-7P or the covering provider during after hours 7P-7A, please log into the web site [email or web address] and access using universal [place] password for that web site. If you do not have the password, please call the hospital operator.  08/17/2023, 10:50 AM

## 2023-08-17 NOTE — Plan of Care (Signed)
   Problem: Education: Goal: Knowledge of disease or condition will improve Outcome: Progressing Goal: Knowledge of secondary prevention will improve (MUST DOCUMENT ALL) Outcome: Progressing Goal: Knowledge of patient specific risk factors will improve (DELETE if not current risk factor) Outcome: Progressing

## 2023-08-17 NOTE — Progress Notes (Signed)
 Cardiac catheterization shows optimized volume status.  Although coronary stenoses are present, they involve smaller or secondary branches and revascularization is not indicated.  His cardiomyopathy cannot be attributed to CAD. Okay for discharge after he completes necessary bed rest following cardiac catheterization. Blood pressure remains a little high so we will increase the carvedilol  to 12.5 mg twice daily.   He had angioedema (face, mouth, tongue) with ACE inhibitors so we should avoid ARB's and Entresto.  [facility] will sign off.   Medication Recommendations:   Aspirin  81 mg daily Clopidogrel  75 mg daily Atorvastatin  40 mg daily Carvedilol  12.5 mg twice daily Dapagliflozin  10 mg daily Furosemide  40 mg daily Spironolactone  25 mg daily Stop amlodipine  and hydrochlorothiazide  Other recommendations (labs, testing, etc):  Repeat basic metabolic panel at follow-up visit.   Repeat echocardiogram in 2-3 months.   Reviewed the importance of dietary sodium restriction and daily weight monitoring, signs and symptoms of heart failure exacerbation. Follow up as an outpatient:  In the [facility] clinic in approximately 2 weeks.

## 2023-08-17 NOTE — Telephone Encounter (Signed)
 Pharmacy Patient Advocate Encounter  Received notification from [facility] that Prior Authorization for Farxiga  10MG  tablets  has been APPROVED from 08/17/2023 to 09/17/2023   PA #/Case ID/Reference #: [id number]

## 2023-08-17 NOTE — Progress Notes (Signed)
 Reviewed AVS, patient expressed understanding of medications, MD follow up reviewed.   Removed IV, Site clean, dry and intact.  See LDA for information on wounds at discharge.  Patient states all belongings brought to the hospital at time of admission are accounted for and packed to take home.   Patient informed and expressed understanding to pick up discharge medications in [facility] pharmacy before leaving hospital.

## 2023-08-17 NOTE — TOC Progression Note (Signed)
 Transition of Care [facility]) - Progression Note    Patient Details  Name: Henry Schmitt MRN: [id number] Date of Birth: 1960/12/21  Transition of Care [facility]) CM/SW Contact  Nola Devere Hands, RN Phone Number: 08/17/2023, 2:14 PM  Clinical Narrative:    Case manager spoke with patient concerning Outpatient therapy appointment. He states that [facility] location is closer to him and he prefers to go there. CM has placed referral and Added to AVS. No further needs identified.    Expected Discharge Plan: Home/Self Care Barriers to Discharge: Continued Medical Work up               Expected Discharge Plan and Services   Discharge Planning Services: CM Consult   Living arrangements for the past 2 months: Apartment Expected Discharge Date: 08/17/23               DME Arranged: Vannie rolling DME Agency: [facility] Date DME Agency Contacted: 08/15/23 Time DME Agency Contacted: [phone] Representative spoke with at DME Agency: Ryan             Social Drivers of Health (SDOH) Interventions SDOH Screenings   Food Insecurity: No Food Insecurity (08/15/2023)  Housing: Low Risk  (08/15/2023)  Transportation Needs: No Transportation Needs (08/15/2023)  Utilities: Not At Risk (08/15/2023)  Financial Resource Strain: Low Risk  (02/12/2023)   Received from [facility]  Physical Activity: Unknown (02/12/2023)   Received from [facility]  Social Connections: Moderately Integrated (02/12/2023)   Received from [facility]  Stress: No Stress Concern Present (02/12/2023)   Received from [facility]  Tobacco Use: High Risk (08/14/2023)    Readmission Risk Interventions     No data to display

## 2023-08-23 ENCOUNTER — Telehealth (HOSPITAL_COMMUNITY): Payer: Self-pay

## 2023-08-23 NOTE — Telephone Encounter (Signed)
 Called to confirm/remind patient of their appointment at the [facility] on 08/24/2023 9:45.   Appointment:   [x] Confirmed  [] Left mess   [] No answer/No voice mail  [] VM Full/unable to leave message  [] Phone not in service  Patient reminded to bring all medications and/or complete list.  Confirmed patient has transportation. Gave directions, instructed to utilize valet parking.

## 2023-08-24 ENCOUNTER — Encounter (HOSPITAL_COMMUNITY): Payer: Self-pay

## 2023-08-24 ENCOUNTER — Ambulatory Visit (HOSPITAL_COMMUNITY)
Admit: 2023-08-24 | Discharge: 2023-08-24 | Disposition: A | Discharge status: Home / Self Care | Attending: Family Medicine | Admitting: Family Medicine

## 2023-08-24 ENCOUNTER — Ambulatory Visit (HOSPITAL_COMMUNITY): Payer: Self-pay | Admitting: Family Medicine

## 2023-08-24 VITALS — BP 148/82 | HR 71 | Ht 66.0 in | Wt 183.6 lb

## 2023-08-24 DIAGNOSIS — I639 Cerebral infarction, unspecified: Secondary | ICD-10-CM | POA: Insufficient documentation

## 2023-08-24 DIAGNOSIS — I4510 Unspecified right bundle-branch block: Secondary | ICD-10-CM | POA: Diagnosis not present

## 2023-08-24 DIAGNOSIS — Z8249 Family history of ischemic heart disease and other diseases of the circulatory system: Secondary | ICD-10-CM | POA: Insufficient documentation

## 2023-08-24 DIAGNOSIS — I429 Cardiomyopathy, unspecified: Secondary | ICD-10-CM | POA: Insufficient documentation

## 2023-08-24 DIAGNOSIS — Z716 Tobacco abuse counseling: Secondary | ICD-10-CM | POA: Insufficient documentation

## 2023-08-24 DIAGNOSIS — Z8673 Personal history of transient ischemic attack (TIA), and cerebral infarction without residual deficits: Secondary | ICD-10-CM

## 2023-08-24 DIAGNOSIS — Z79899 Other long term (current) drug therapy: Secondary | ICD-10-CM | POA: Insufficient documentation

## 2023-08-24 DIAGNOSIS — I10 Essential (primary) hypertension: Secondary | ICD-10-CM | POA: Diagnosis not present

## 2023-08-24 DIAGNOSIS — T445X5A Adverse effect of predominantly beta-adrenoreceptor agonists, initial encounter: Secondary | ICD-10-CM | POA: Diagnosis not present

## 2023-08-24 DIAGNOSIS — I5041 Acute combined systolic (congestive) and diastolic (congestive) heart failure: Secondary | ICD-10-CM | POA: Insufficient documentation

## 2023-08-24 DIAGNOSIS — F17210 Nicotine dependence, cigarettes, uncomplicated: Secondary | ICD-10-CM | POA: Insufficient documentation

## 2023-08-24 DIAGNOSIS — I2510 Atherosclerotic heart disease of native coronary artery without angina pectoris: Secondary | ICD-10-CM | POA: Diagnosis not present

## 2023-08-24 DIAGNOSIS — I160 Hypertensive urgency: Secondary | ICD-10-CM | POA: Diagnosis not present

## 2023-08-24 DIAGNOSIS — I110 Hypertensive heart disease with heart failure: Secondary | ICD-10-CM | POA: Diagnosis not present

## 2023-08-24 DIAGNOSIS — R5383 Other fatigue: Secondary | ICD-10-CM | POA: Diagnosis not present

## 2023-08-24 DIAGNOSIS — Z7902 Long term (current) use of antithrombotics/antiplatelets: Secondary | ICD-10-CM | POA: Diagnosis not present

## 2023-08-24 DIAGNOSIS — I6529 Occlusion and stenosis of unspecified carotid artery: Secondary | ICD-10-CM

## 2023-08-24 DIAGNOSIS — I6523 Occlusion and stenosis of bilateral carotid arteries: Secondary | ICD-10-CM | POA: Insufficient documentation

## 2023-08-24 DIAGNOSIS — T783XXA Angioneurotic edema, initial encounter: Secondary | ICD-10-CM | POA: Diagnosis not present

## 2023-08-24 DIAGNOSIS — Z7982 Long term (current) use of aspirin: Secondary | ICD-10-CM | POA: Insufficient documentation

## 2023-08-24 DIAGNOSIS — I5022 Chronic systolic (congestive) heart failure: Secondary | ICD-10-CM

## 2023-08-24 DIAGNOSIS — Z720 Tobacco use: Secondary | ICD-10-CM

## 2023-08-24 LAB — BASIC METABOLIC PANEL WITH GFR
Anion gap: 10 (ref 5–15)
BUN: 19 mg/dL (ref 8–23)
CO2: 24 mmol/L (ref 22–32)
Calcium: 9.4 mg/dL (ref 8.9–10.3)
Chloride: 102 mmol/L (ref 98–111)
Creatinine, Ser: 1.02 mg/dL (ref 0.61–1.24)
GFR, Estimated: >60 mL/min (ref >60)
Glucose, Bld: 122 mg/dL — ABNORMAL HIGH (ref 70–99)
Potassium: 4.5 mmol/L (ref 3.5–5.1)
Sodium: 136 mmol/L (ref 135–145)

## 2023-08-24 LAB — BRAIN NATRIURETIC PEPTIDE: B Natriuretic Peptide: 44.8 pg/mL (ref 0.0–100.0)

## 2023-08-24 MED ORDER — LOSARTAN POTASSIUM 25 MG PO TABS: 25.0000 mg | ORAL_TABLET | Freq: Every day | ORAL | 1 refills | Status: DC

## 2023-08-24 MED ORDER — CARVEDILOL 12.5 MG PO TABS: 12.5000 mg | ORAL_TABLET | Freq: Two times a day (BID) | ORAL | 2 refills | Status: DC

## 2023-08-24 MED ORDER — EPLERENONE 25 MG PO TABS: 25.0000 mg | ORAL_TABLET | Freq: Every day | ORAL | 1 refills | Status: DC

## 2023-08-24 MED ORDER — ATORVASTATIN CALCIUM 40 MG PO TABS: 40.0000 mg | ORAL_TABLET | Freq: Every day | ORAL | 2 refills | Status: AC

## 2023-08-24 MED ORDER — ASPIRIN 81 MG PO TBEC: 81.0000 mg | DELAYED_RELEASE_TABLET | Freq: Every day | ORAL | 12 refills | Status: AC

## 2023-08-24 MED ORDER — DAPAGLIFLOZIN PROPANEDIOL 10 MG PO TABS: 10.0000 mg | ORAL_TABLET | Freq: Every day | ORAL | 2 refills | Status: DC

## 2023-08-24 MED ORDER — FUROSEMIDE 40 MG PO TABS: 40.0000 mg | ORAL_TABLET | Freq: Every day | ORAL | 2 refills | Status: DC

## 2023-08-24 MED ORDER — CLOPIDOGREL BISULFATE 75 MG PO TABS: 75.0000 mg | ORAL_TABLET | Freq: Every day | ORAL | 2 refills | Status: DC

## 2023-08-24 NOTE — Patient Instructions (Addendum)
 Good to see you today!  Labs done today, your results will be available in MyChart, we will contact you for abnormal readings.  START Losartan  25 mg daily  STOP Spironolactone   START Eplernone 25 mg daily  STOP Plavix  on 09/06/2023  Your physician has requested that you have a cardiac MRI. Cardiac MRI uses a computer to create images of your heart as its beating, producing both still and moving pictures of your heart and major blood vessels. For further information please visit [email or web address]. Please follow the instruction sheet given to you today for more information.   Your physician has requested that you have an echocardiogram. Echocardiography is a painless test that uses sound waves to create images of your heart. It provides your doctor with information about the size and shape of your heart and how well your heart's chambers and valves are working. This procedure takes approximately one hour. There are no restrictions for this procedure. Please do NOT wear cologne, perfume, aftershave, or lotions (deodorant is allowed). Please arrive 15 minutes prior to your appointment time.  Please note: We ask at that you not bring children with you during ultrasound (echo/ vascular) testing. Due to room size and safety concerns, children are not allowed in the ultrasound rooms during exams. Our front office staff cannot provide observation of children in our lobby area while testing is being conducted. An adult accompanying a patient to their appointment will only be allowed in the ultrasound room at the discretion of the ultrasound technician under special circumstances. We apologize for any inconvenience.  You have been referred to Cardiac rehab they will call  to schedule an appointment  Check b/p  daily and record  Your physician recommends that you schedule a follow-up appointment in: 3 weeks as scheduled with app clinic And 3 months with echocardiogram(November) Call office in September  to schedule an appointment  If you have any questions or concerns before your next appointment please send us  a message through [place] or call our office at [phone].    TO LEAVE A MESSAGE FOR THE NURSE SELECT OPTION 2, PLEASE LEAVE A MESSAGE INCLUDING: YOUR NAME DATE OF BIRTH CALL BACK NUMBER REASON FOR CALL**this is important as we prioritize the call backs  YOU WILL RECEIVE A CALL BACK THE SAME DAY AS LONG AS YOU CALL BEFORE 4:00 PM At the [facility], you and your health needs are our priority. As part of our continuing mission to provide you with exceptional heart care, we have created designated Provider Care Teams. These Care Teams include your primary Cardiologist (physician) and Advanced Practice Providers (APPs- Physician Assistants and Nurse Practitioners) who all work together to provide you with the care you need, when you need it.   You may see any of the following providers on your designated Care Team at your next follow up: Dr Toribio Fuel Dr Ezra Shuck Dr. Ria Commander Dr. Morene Brownie Amy Lenetta, NP Caffie Shed, [place] [facility] [place], [place] Beckey Coe, NP [place] Lee, NP Ellouise Class, NP Tinnie Redman, PharmD Jaun Bash, PharmD   Please be sure to bring in all your medications bottles to every appointment.    Thank you for choosing [facility]

## 2023-08-24 NOTE — Progress Notes (Signed)
 ReDS Vest / Clip - 08/24/23 0955       ReDS Vest / Clip   Station Marker D    Ruler Value 35    ReDS Value Range Low volume    ReDS Actual Value 25

## 2023-08-24 NOTE — Progress Notes (Signed)
 HEART & VASCULAR TRANSITION OF CARE CONSULT NOTE    Referring Physician: Dr. Francyne PCP: [name], PA-C   HPI: Referred to clinic by Dr. Francyne for heart failure consultation.   Henry Schmitt. is a 63 y.o. male a hx of HTN and tobacco use, and newly diagnosed CVA and HFrEF.  Saw PCP 08/14/23 with CP and new R-sided weakness, sent to ED for eval. Admitted 8/25 with CVA and hypertensive emergency, BP 184/95. HsTroponin 21>.23. ECG showed NSR, LVH and new LBBB. MRI brain showed acute right pontine infarct, CTA head/neck showed no LVO, 40% stenosis at the origin of the right cervical ICA, 50% stenosis origin of left cervical ICA, moderate stenosis left cavernous ICA.  Echo showed EF 30-35%, with global HK worse in inferior wall, G1DD. Underwent R/LHC showing non-obs CAD, normal filling pressures and preserved CI. CM out of proportion to his CAD. GDMT titrated. On ASA, Plavix  and statin, per Neuro. He was discharged home, weight 179 lbs.  Today he presents to [facility] for post hospital HF follow up. Overall feeling fine. He has SOB walking further distances on flat ground. No issues walking up steps, lives on 2nd floor apt. + bendopnea. Denies palpitations, abnormal bleeding, CP, dizziness, edema, or PND/Orthopnea. Appetite ok. Weight at home 179-181 pounds. Taking all medications. He does not snore.  He has daytime fatigue and naps during the day. 1/2 ppd smoker, occasional ETOH no drugs. Has an elderly Shih-Tzu, Henry Schmitt. Works 3rd shift, sedentary job.  ECG (personally reviewed): NSR 70 bpm  ReDs reading: 25%, normal  Labs (8/25): K 3.8, creatinine 0.98, LDL 93  Cardiac Testing  - R/LHC (8/25): non-obs CAD, RA 3, PA 25/9 (15), PCWP 6, CO/CI (Fick) 4.29/2.29  - Echo (8/25): EF 30-355, mild LVH, G1DD, normal RV  - Echo (4/17): EF 60-65%, mild LVH  Social Hx: 7 ETOH drinks/week, 1/2 ppd x 40 years, no drugs use but past cocaine x 15 years (quit 25 years ago) Family Hx:  mother deceased with CHF  Past Medical History:  Diagnosis Date   Hypertension    Current Outpatient Medications  Medication Sig Dispense Refill   aspirin  EC 81 MG tablet Take 1 tablet (81 mg total) by mouth daily. Swallow whole. 30 tablet 12   atorvastatin  (LIPITOR) 40 MG tablet Take 1 tablet (40 mg total) by mouth daily. 30 tablet 2   carvedilol  (COREG ) 12.5 MG tablet Take 1 tablet (12.5 mg total) by mouth 2 (two) times daily with a meal. 60 tablet 2   clopidogrel  (PLAVIX ) 75 MG tablet Take 1 tablet (75 mg total) by mouth daily. 30 tablet 2   dapagliflozin  propanediol (FARXIGA ) 10 MG TABS tablet Take 1 tablet (10 mg total) by mouth daily. 30 tablet 2   furosemide  (LASIX ) 40 MG tablet Take 1 tablet (40 mg total) by mouth daily. 30 tablet 2   ibuprofen  (ADVIL ) 200 MG tablet Take 200-600 mg by mouth every 6 (six) hours as needed.     spironolactone  (ALDACTONE ) 25 MG tablet Take 1 tablet (25 mg total) by mouth daily. 30 tablet 2   zolpidem (AMBIEN) 10 MG tablet Take 10 mg by mouth at bedtime as needed.     No current facility-administered medications for this encounter.   Allergies  Allergen Reactions   Peanut-Containing Drug Products Swelling    ALL TREE NUTS   Antihistamines, Chlorpheniramine-Type Hives and Swelling   Lisinopril Swelling   Social History   Socioeconomic History  Marital status: Single    Spouse name: Not on file   Number of children: Not on file   Years of education: Not on file   Highest education level: Not on file  Occupational History   Not on file  Tobacco Use   Smoking status: Every Day    Current packs/day: 0.50    Types: Cigarettes   Smokeless tobacco: Never  Vaping Use   Vaping status: Never Used  Substance and Sexual Activity   Alcohol use: Yes    Comment: 3 times per week: brandy or beer   Drug use: Not Currently   Sexual activity: Never  Other Topics Concern   Not on file  Social History Narrative   Not on file   Social Drivers of  Health   Financial Resource Strain: Low Risk  (02/12/2023)   Received from [facility]   Overall Financial Resource Strain (CARDIA)    Difficulty of Paying Living Expenses: Not very hard  Food Insecurity: No Food Insecurity (08/15/2023)   Hunger Vital Sign    Worried About Running Out of Food in the Last Year: Never true    Ran Out of Food in the Last Year: Never true  Transportation Needs: No Transportation Needs (08/15/2023)   PRAPARE - Administrator, Civil Service (Medical): No    Lack of Transportation (Non-Medical): No  Physical Activity: Unknown (02/12/2023)   [facility]   Exercise Vital Sign    On average, how many days per week do you engage in moderate to strenuous exercise (like a brisk walk)?: 0 days    Minutes of Exercise per Session: Not on file  Stress: No Stress Concern Present (02/12/2023)   Received from [facility] of Occupational Health - Occupational Stress Questionnaire    Feeling of Stress : Only a little  Social Connections: Moderately Integrated (02/12/2023)   Received from [facility]   Social Network    How would you rate your social network (family, work, friends)?: Adequate participation with social networks  Intimate Partner Violence: Not At Risk (08/15/2023)   Humiliation, Afraid, Rape, and Kick questionnaire    Fear of Current or Ex-Partner: No    Emotionally Abused: No    Physically Abused: No    Sexually Abused: No   Family History  Problem Relation Age of Onset   Heart failure Mother    Hypertension Mother    Diabetes Other    Wt Readings from Last 3 Encounters:  08/24/23 83.3 kg (183 lb 9.6 oz)  08/17/23 81.4 kg (179 lb 8 oz)  10/11/22 85.3 kg (188 lb 0.8 oz)   BP (!) 148/82   Pulse 71   Ht 5' 6 (1.676 m)   Wt 83.3 kg (183 lb 9.6 oz)   SpO2 98%   BMI 29.63 kg/m   PHYSICAL EXAM: General:  NAD. No resp difficulty, walked into clinic HEENT: Normal Neck: Supple. No JVD. Cor: Regular rate  & rhythm. No rubs, gallops or murmurs. Lungs: Clear Abdomen: Soft, nontender, nondistended.  Extremities: No cyanosis, clubbing, rash, edema Neuro: Alert & oriented x 3, moves all 4 extremities w/o difficulty. Affect pleasant.   ASSESSMENT & PLAN: 1. Chronic Systolic Heart Failure: Newly diagnosed in setting of CVA and HTN urgency. Echo showed EF 30-35%, HK in inferior wall, G1DD, normal RV. R/LHC showed non-obs CAD, stable filling pressures and preserved CI. CM felt to be out or proportion to CAD. Family history of CHF, remote  cocaine (>25 years ago), does have HTN. Also had new LBBB during admission, ECG today shows SR with iRBBB, QRS 100 msec. NYHA II, he is not volume overlaoded today. ReDs 25% - Arrange cMRI to evaluate for evidence of infiltrative/inflammatory disease.  - Angioedema (face, mouth, tongue) with ACEis. Will trial losartan  25 mg daily. Personally discussed with PharmD, low risk of angioedema. - Continue Coreg  12.5 mg bid. - Continue Lasix  40 mg daily. BMET and BNP today. - Continue Farxiga  10 mg daily - Stop spironolactone  and start eplerenone  25 mg daily (with history of gynecomastia).  - Plan to repeat echo in 3 months to look for improvement in EF.  - Refer to Cardiac Rehab. 2. CAD: LHC showed 40 % pRCA, 80% mid RCA, 1st marginal 90%, 1st diagonal 85%. Although coronary stenoses are present, they involve smaller or secondary branches and revascularization is not indicated. No chest pain. Continue medical management. - Needs to stop smoking - Continue ASA 81 mg daily. - Continue atorvastatin  40 mg daily. 3. HTN: Longstanding. Previously treated with amlodipine  and hydrochlorothiazide . LVH on ECG. BP elevated today. - Add losartan  as above - I asked him to check his BP daily and log. 4. CVA: New right pontine infarct on MRI brain. No residual deficits. - Continue ASA/Plavix  x 3 weeks, then can stop Plavix , per Neurology. - He has neurology follow up soon. 5. Carotid  stenosis: CTA head/neck showed RICA 40% stenosis, LICA 50% stenosis, moderate stenosis left cavernous ICA.  - Continue ASA and statin. 5. Tobacco use: smokes 1/2 ppd x 40 years. Discussed cessation. - He plans to discuss starting Chantix with PCP at follow up soon.  NYHA I GDMT  Diuretic: Lasix  40 mg daily BB: Coreg  12.5 mg bid Ace/ARB/ARNI: start losartan  25 mg daily MRA: stop spiro, start eplerenone  25 mg daily SGLT2i: Farxiga  10 mg daily  Referred to HFSW (PCP, Medications, Transportation, ETOH Abuse, Drug Abuse, Insurance, Financial ): No Refer to Pharmacy: No Refer to Home Health: No Refer to Advanced Heart Failure Clinic: Yes, assign to Dr. Rolan Refer to General Cardiology: No  Refer to AHF clinic. Follow up in 3 weeks with APP and 3 months with Dr. Rolan + echo. Given note for employer today, OK to return to work on 08/27/23.   Harlene Gainer, FNP-BC 08/24/23

## 2023-08-27 ENCOUNTER — Telehealth (HOSPITAL_COMMUNITY): Payer: Self-pay | Admitting: *Deleted

## 2023-08-27 NOTE — Telephone Encounter (Signed)
 Received referral from Dr Mclean for this pt to participate in Cardiac rehab with the diagnosis of Chronic Systolic Heart Failure.  Noted recent admission for CVA - discharged on 8/8. Called and left message to review general information regarding group exercise at Cardiac rehab.  Also would like to ascertain how outpatient therapy is going. Improvement of his mobility and if a length of time has been discussed. This well help determine his readiness to proceed with scheduling CR.  Pt has neuro follow up on 9/16 and will have close follow up with HF clinic on 9/2.  Contact information given. Henry Schmitt PEAK, BSN Cardiac and Emergency planning/management officer

## 2023-08-27 NOTE — Telephone Encounter (Signed)
 Returned my call.  Very interested in participating in Cardiac Rehab.  General information provided. Asked about outpatient physical therapy. Pt stated that it never got scheduled.  Asked how he was doing with his mobility and if he was at his baseline.  Pt stated that he felt he was doing fine and back to his baseline.  Driving with no difficulties.  To see neuro on 9/16.  Advised we are also waiting on the [facility] reimbursement form. Verbalized understanding. Henry Schmitt PEAK, BSN Cardiac and Emergency planning/management officer

## 2023-08-28 ENCOUNTER — Encounter: Payer: Self-pay | Admitting: Podiatry

## 2023-08-28 ENCOUNTER — Ambulatory Visit (INDEPENDENT_AMBULATORY_CARE_PROVIDER_SITE_OTHER): Admitting: Podiatry

## 2023-08-28 DIAGNOSIS — D492 Neoplasm of unspecified behavior of bone, soft tissue, and skin: Secondary | ICD-10-CM | POA: Diagnosis not present

## 2023-08-28 NOTE — Progress Notes (Signed)
 He presents today chief complaint of painful skin lesions plantar aspect of the ball of foot subfifth met heads.  States that these been going on for quite some time.  Does relate a recent history of mild stroke and hypertension.  States that he is fully recovered from stroke.  Objective: Vital signs stable alert oriented x 3 pulses are palpable.  Benign reactive hyperkeratotic lesion subfifth metatarsal chronic in nature painful palpation no fluctuance beneath the lesion.  Assessment: Benign skin lesion subfifth bilateral.  Plan: Debridement of benign skin lesions okay bilateral normal skin no iatrogenic lesions.  He will be going mobic  for use in 2 weeks

## 2023-09-06 ENCOUNTER — Other Ambulatory Visit (HOSPITAL_COMMUNITY): Payer: Self-pay

## 2023-09-07 ENCOUNTER — Telehealth (HOSPITAL_COMMUNITY): Payer: Self-pay | Admitting: *Deleted

## 2023-09-07 NOTE — Progress Notes (Signed)
 ADVANCED HF CLINIC CONSULT NOTE   Primary Care: Henry Schmitt Primary Cardiologist: Jerel Balding, MD HF Cardiologist: Dr. Rolan  HPI: Henry Schmitt. is a 63 y.o. male a hx of HTN and tobacco use, and newly diagnosed CVA and HFrEF.   Saw PCP 08/14/23 with CP and new R-sided weakness, sent to ED for eval. Admitted 8/25 with CVA and hypertensive emergency, BP 184/95. HsTroponin 21>.23. ECG showed NSR, LVH and new LBBB. MRI brain showed acute right pontine infarct, CTA head/neck showed no LVO, 40% stenosis at the origin of the right cervical ICA, 50% stenosis origin of left cervical ICA, moderate stenosis left cavernous ICA.  Echo showed EF 30-35%, with global HK worse in inferior wall, G1DD. Underwent R/LHC showing non-obs CAD, normal filling pressures and preserved CI. CM out of proportion to his CAD. GDMT titrated. On ASA, Plavix  and statin, per Neuro. He was discharged home, weight 179 lbs. Arranged follow up in [facility] clinic.   Today he presents to [facility] for post hospital HF follow up. Overall feeling fine. He has SOB walking further distances on flat ground. No issues walking up steps, lives on 2nd floor apt. + bendopnea. Denies palpitations, abnormal bleeding, CP, dizziness, edema, or PND/Orthopnea. Appetite ok. Weight at home 179-181 pounds. Taking all medications. He does not snore.  He has daytime fatigue and naps during the day. 1/2 ppd smoker, occasional ETOH no drugs. Has an elderly Shih-Tzu, [name]. Works 3rd shift, sedentary job.  Today he returns for HF follow up. Overall feeling fatigued. Has had 2 episodes of dizziness since last visit, no falls or syncope. He is SOB walking further distances on flat ground. Does OK walking his elderly dog, [name]. Denies palpitations, abnormal bleeding, CP, edema, or PND/Orthopnea. Appetite ok. Weight at home 181 pounds. Taking all medications. BP at home 137-160/80s. Smoking 7 cigs/day, has Chantix from PCP.  ECG (personally reviewed):  NSR, iRBBB     ReDs reading: 31%, normal   Labs (8/25): K 3.5, creatinine 0.93, LDL 93   Cardiac Testing  - R/LHC (8/25): non-obs CAD, RA 3, PA 25/9 (15), PCWP 6, CO/CI (Fick) 4.29/2.29   - Echo (8/25): EF 30-355, mild LVH, G1DD, normal RV   - Echo (4/17): EF 60-65%, mild LVH   Social Hx: 7 ETOH drinks/week, 1/2 ppd x 40 years, no drugs use but past cocaine x 15 years (quit 25 years ago) Family Hx: mother deceased with CHF   Past Medical History:  Diagnosis Date   Hypertension    Current Outpatient Medications  Medication Sig Dispense Refill   aspirin  EC 81 MG tablet Take 1 tablet (81 mg total) by mouth daily. Swallow whole. 30 tablet 12   atorvastatin  (LIPITOR) 40 MG tablet Take 1 tablet (40 mg total) by mouth daily. 90 tablet 2   carvedilol  (COREG ) 12.5 MG tablet Take 1 tablet (12.5 mg total) by mouth 2 (two) times daily with a meal. 90 tablet 2   dapagliflozin  propanediol (FARXIGA ) 10 MG TABS tablet Take 1 tablet (10 mg total) by mouth daily. 90 tablet 2   eplerenone  (INSPRA ) 25 MG tablet Take 1 tablet (25 mg total) by mouth daily. 90 tablet 1   furosemide  (LASIX ) 40 MG tablet Take 1 tablet (40 mg total) by mouth daily. 90 tablet 2   ibuprofen  (ADVIL ) 200 MG tablet Take 200-600 mg by mouth every 6 (six) hours as needed.     losartan  (COZAAR ) 25 MG tablet Take 1 tablet (25 mg total)  by mouth daily. 90 tablet 1   zolpidem (AMBIEN) 10 MG tablet Take 10 mg by mouth at bedtime as needed.     clopidogrel  (PLAVIX ) 75 MG tablet Take 1 tablet (75 mg total) by mouth daily. (Patient not taking: Reported on 09/11/2023) 30 tablet 2   No current facility-administered medications for this encounter.   Allergies  Allergen Reactions   Peanut-Containing Drug Products Swelling    ALL TREE NUTS   Antihistamines, Chlorpheniramine-Type Hives and Swelling   Lisinopril Swelling   Social History   Socioeconomic History   Marital status: Single    Spouse name: Not on file   Number of children:  Not on file   Years of education: Not on file   Highest education level: Not on file  Occupational History   Not on file  Tobacco Use   Smoking status: Every Day    Current packs/day: 0.50    Types: Cigarettes   Smokeless tobacco: Never  Vaping Use   Vaping status: Never Used  Substance and Sexual Activity   Alcohol use: Yes    Comment: 3 times per week: brandy or beer   Drug use: Not Currently   Sexual activity: Never  Other Topics Concern   Not on file  Social History Narrative   Not on file   Social Drivers of Health   Financial Resource Strain: Low Risk  (02/12/2023)   Received from [facility]   Overall Financial Resource Strain (CARDIA)    Difficulty of Paying Living Expenses: Not very hard  Food Insecurity: No Food Insecurity (08/15/2023)   Hunger Vital Sign    Worried About Running Out of Food in the Last Year: Never true    Ran Out of Food in the Last Year: Never true  Transportation Needs: No Transportation Needs (08/15/2023)   PRAPARE - Administrator, Civil Service (Medical): No    Lack of Transportation (Non-Medical): No  Physical Activity: Unknown (02/12/2023)   [facility]   Exercise Vital Sign    On average, how many days per week do you engage in moderate to strenuous exercise (like a brisk walk)?: 0 days    Minutes of Exercise per Session: Not on file  Stress: No Stress Concern Present (02/12/2023)   Received from [facility] of Occupational Health - Occupational Stress Questionnaire    Feeling of Stress : Only a little  Social Connections: Moderately Integrated (02/12/2023)   Received from [facility]   Social Network    How would you rate your social network (family, work, friends)?: Adequate participation with social networks  Intimate Partner Violence: Not At Risk (08/15/2023)   Humiliation, Afraid, Rape, and Kick questionnaire    Fear of Current or Ex-Partner: No    Emotionally Abused: No    Physically  Abused: No    Sexually Abused: No   Family History  Problem Relation Age of Onset   Heart failure Mother    Hypertension Mother    Diabetes Other    Wt Readings from Last 3 Encounters:  09/11/23 84.7 kg (186 lb 12.8 oz)  08/24/23 83.3 kg (183 lb 9.6 oz)  08/17/23 81.4 kg (179 lb 8 oz)   BP 116/86   Pulse 77   Ht 5' 6 (1.676 m)   Wt 84.7 kg (186 lb 12.8 oz)   SpO2 96%   BMI 30.15 kg/m   PHYSICAL EXAM: General:  NAD. No resp difficulty, walked into clinic HEENT:  Normal Neck: Supple. No JVD. Cor: Regular rate & rhythm. No rubs, gallops or murmurs. Lungs: Clear, diminished in bases Abdomen: Soft, nontender, nondistended.  Extremities: No cyanosis, clubbing, rash, edema Neuro: Alert & oriented x 3, moves all 4 extremities w/o difficulty. Affect pleasant.  ASSESSMENT & PLAN: 1. Chronic Systolic Heart Failure: Newly diagnosed in setting of CVA and HTN urgency. Echo showed EF 30-35%, HK in inferior wall, G1DD, normal RV. R/LHC showed non-obs CAD, stable filling pressures and preserved CI. CM felt to be out or proportion to CAD. Family history of CHF, remote cocaine (>25 years ago), does have HTN. Also had new LBBB during admission, however ECG today shows SR with iRBBB, QRS 104 msec. NYHA II, he is not volume overlaoded today. ReDs 31%. - Arrange cMRI to evaluate for evidence of infiltrative/inflammatory disease.  - With fatigue, start digoxin  0.125 mg daily. Check dig trough in 2 weeks. If cMRI suggestive of infiltrative dz, will need to stop digoxin . - Continue losartan  25 mg daily (angioedema with ACEi's so no Entresto) - Continue Coreg  12.5 mg bid. - Continue Lasix  40 mg daily. Recent labs from PCP reviewed and are stable. - Continue Farxiga  10 mg daily - Continue eplerenone  25 mg daily (history of gynecomastia).  - Repeat echo in 2 months to look for improvement in EF.  - He has been referred to Cardiac Rehab. 2. CAD: LHC showed 40 % pRCA, 80% mid RCA, 1st marginal 90%, 1st  diagonal 85%. Although coronary stenoses are present, they involve smaller or secondary branches and revascularization is not indicated. No chest pain. Continue medical management. - Needs to stop smoking - Continue ASA 81 mg daily. - Continue atorvastatin  40 mg daily. 3. HTN: Longstanding. Previously treated with amlodipine  and hydrochlorothiazide . LVH on ECG. BP better controlled today. - Continue GDMT as above - I asked him to check his BP daily and log. 4. CVA: New right pontine infarct on MRI brain. No residual deficits. - Continue ASA, now off Plavix  per Neurology. - He has neurology follow up soon. 5. Carotid stenosis: CTA head/neck showed RICA 40% stenosis, LICA 50% stenosis, moderate stenosis left cavernous ICA.  - Continue ASA and statin. 6. Tobacco use: smokes 1/2 ppd x 40 years. Discussed cessation. - He plans to start Chantix soon. 7. Daytime sleepiness: arrange sleep study.   Follow up in 2 months with Dr. Rolan + echo.   Harlene Gainer, FNP-BC 09/11/23

## 2023-09-07 NOTE — Telephone Encounter (Signed)
 Called to confirm/remind patient of their appointment at the Advanced Heart Failure Clinic on 09/11/23.       Appointment:              [x] Confirmed             [] Left mess              [] No answer/No voice mail             [] Phone not in service   Patient reminded to bring all medications and/or complete list.   Confirmed patient has transportation. Gave directions, instructed to utilize valet parking.

## 2023-09-11 ENCOUNTER — Encounter (HOSPITAL_COMMUNITY): Payer: Self-pay

## 2023-09-11 ENCOUNTER — Ambulatory Visit (HOSPITAL_COMMUNITY)
Admission: RE | Admit: 2023-09-11 | Discharge: 2023-09-11 | Disposition: A | Discharge status: Home / Self Care | Source: Ambulatory Visit | Attending: Family Medicine | Admitting: Family Medicine

## 2023-09-11 VITALS — BP 116/86 | HR 77 | Ht 66.0 in | Wt 186.8 lb

## 2023-09-11 DIAGNOSIS — I2510 Atherosclerotic heart disease of native coronary artery without angina pectoris: Secondary | ICD-10-CM | POA: Diagnosis not present

## 2023-09-11 DIAGNOSIS — Z79899 Other long term (current) drug therapy: Secondary | ICD-10-CM | POA: Insufficient documentation

## 2023-09-11 DIAGNOSIS — I6523 Occlusion and stenosis of bilateral carotid arteries: Secondary | ICD-10-CM | POA: Diagnosis not present

## 2023-09-11 DIAGNOSIS — Z8673 Personal history of transient ischemic attack (TIA), and cerebral infarction without residual deficits: Secondary | ICD-10-CM

## 2023-09-11 DIAGNOSIS — I6529 Occlusion and stenosis of unspecified carotid artery: Secondary | ICD-10-CM

## 2023-09-11 DIAGNOSIS — I110 Hypertensive heart disease with heart failure: Secondary | ICD-10-CM | POA: Diagnosis not present

## 2023-09-11 DIAGNOSIS — R400 Somnolence: Secondary | ICD-10-CM | POA: Diagnosis not present

## 2023-09-11 DIAGNOSIS — Z720 Tobacco use: Secondary | ICD-10-CM

## 2023-09-11 DIAGNOSIS — Z7982 Long term (current) use of aspirin: Secondary | ICD-10-CM | POA: Insufficient documentation

## 2023-09-11 DIAGNOSIS — I639 Cerebral infarction, unspecified: Secondary | ICD-10-CM | POA: Insufficient documentation

## 2023-09-11 DIAGNOSIS — F17210 Nicotine dependence, cigarettes, uncomplicated: Secondary | ICD-10-CM | POA: Diagnosis not present

## 2023-09-11 DIAGNOSIS — I10 Essential (primary) hypertension: Secondary | ICD-10-CM | POA: Diagnosis not present

## 2023-09-11 DIAGNOSIS — I5022 Chronic systolic (congestive) heart failure: Secondary | ICD-10-CM | POA: Diagnosis present

## 2023-09-11 MED ORDER — DIGOXIN 125 MCG PO TABS: 0.1250 mg | ORAL_TABLET | Freq: Every day | ORAL | 11 refills | Status: DC

## 2023-09-11 NOTE — Progress Notes (Signed)
 ReDS Vest / Clip - 09/11/23 1100       ReDS Vest / Clip   Station Marker C    Ruler Value 31    ReDS Value Range Low volume    ReDS Actual Value 31

## 2023-09-11 NOTE — Patient Instructions (Signed)
 Start Digoxin  0.125 mg daily - Rx sent. Return for lab (Digoxin  level) in two weeks - see below. REMEMBER NOT TO TAKE YOUR DIGOXIN  THE MORNING BEFORE YOU LAB IS DRAWN.  Referral sent to Pulmonary for your sleep study. They will call you to schedule your first appointment. If not, see their contact info below and you can call them. Return to see Dr. Rolan with echo in 2 months.  PLEASE CALL [phone] THE END OF OCTOBER TO SCHEDULE THIS APPOINTMENT. Please call [phone] if any questions prior to your next visit.

## 2023-09-14 ENCOUNTER — Ambulatory Visit (HOSPITAL_COMMUNITY): Payer: Self-pay | Admitting: Family Medicine

## 2023-09-21 ENCOUNTER — Emergency Department (HOSPITAL_BASED_OUTPATIENT_CLINIC_OR_DEPARTMENT_OTHER)
Admission: EM | Admit: 2023-09-21 | Discharge: 2023-09-21 | Disposition: A | Discharge status: Home / Self Care | Attending: Emergency Medicine | Admitting: Emergency Medicine

## 2023-09-21 ENCOUNTER — Other Ambulatory Visit: Payer: Self-pay

## 2023-09-21 DIAGNOSIS — D72829 Elevated white blood cell count, unspecified: Secondary | ICD-10-CM | POA: Insufficient documentation

## 2023-09-21 DIAGNOSIS — Z7901 Long term (current) use of anticoagulants: Secondary | ICD-10-CM | POA: Diagnosis not present

## 2023-09-21 DIAGNOSIS — K611 Rectal abscess: Secondary | ICD-10-CM | POA: Insufficient documentation

## 2023-09-21 DIAGNOSIS — E876 Hypokalemia: Secondary | ICD-10-CM | POA: Insufficient documentation

## 2023-09-21 DIAGNOSIS — Z91010 Allergy to peanuts: Secondary | ICD-10-CM | POA: Diagnosis not present

## 2023-09-21 DIAGNOSIS — Z7982 Long term (current) use of aspirin: Secondary | ICD-10-CM | POA: Insufficient documentation

## 2023-09-21 LAB — COMPREHENSIVE METABOLIC PANEL WITH GFR
ALT: 25 U/L (ref 0–44)
AST: 21 U/L (ref 15–41)
Albumin: 4.1 g/dL (ref 3.5–5.0)
Alkaline Phosphatase: 139 U/L — ABNORMAL HIGH (ref 38–126)
Anion gap: 13 (ref 5–15)
BUN: 14 mg/dL (ref 8–23)
CO2: 24 mmol/L (ref 22–32)
Calcium: 9.4 mg/dL (ref 8.9–10.3)
Chloride: 105 mmol/L (ref 98–111)
Creatinine, Ser: 0.79 mg/dL (ref 0.61–1.24)
GFR, Estimated: >60 mL/min (ref >60)
Glucose, Bld: 116 mg/dL — ABNORMAL HIGH (ref 70–99)
Potassium: 3.4 mmol/L — ABNORMAL LOW (ref 3.5–5.1)
Sodium: 143 mmol/L (ref 135–145)
Total Bilirubin: 0.4 mg/dL (ref 0.0–1.2)
Total Protein: 7.4 g/dL (ref 6.5–8.1)

## 2023-09-21 LAB — CBC WITH DIFFERENTIAL/PLATELET
Abs Immature Granulocytes: 0.09 K/uL — ABNORMAL HIGH (ref 0.00–0.07)
Basophils Absolute: 0.1 K/uL (ref 0.0–0.1)
Basophils Relative: 1 %
Eosinophils Absolute: 0.3 K/uL (ref 0.0–0.5)
Eosinophils Relative: 2 %
HCT: 37.5 % — ABNORMAL LOW (ref 39.0–52.0)
Hemoglobin: 12.2 g/dL — ABNORMAL LOW (ref 13.0–17.0)
Immature Granulocytes: 1 %
Lymphocytes Relative: 27 %
Lymphs Abs: 3.3 K/uL (ref 0.7–4.0)
MCH: 30.6 pg (ref 26.0–34.0)
MCHC: 32.5 g/dL (ref 30.0–36.0)
MCV: 94 fL (ref 80.0–100.0)
Monocytes Absolute: 0.7 K/uL (ref 0.1–1.0)
Monocytes Relative: 6 %
Neutro Abs: 7.7 K/uL (ref 1.7–7.7)
Neutrophils Relative %: 63 %
Platelets: 384 K/uL (ref 150–400)
RBC: 3.99 MIL/uL — ABNORMAL LOW (ref 4.22–5.81)
RDW: 13.8 % (ref 11.5–15.5)
WBC: 12.2 K/uL — ABNORMAL HIGH (ref 4.0–10.5)
nRBC: 0 % (ref 0.0–0.2)

## 2023-09-21 LAB — LIPASE, BLOOD: Lipase: 24 U/L (ref 11–51)

## 2023-09-21 LAB — LACTIC ACID, PLASMA: Lactic Acid, Venous: 0.9 mmol/L (ref 0.5–1.9)

## 2023-09-21 MED ORDER — AMOXICILLIN-POT CLAVULANATE 875-125 MG PO TABS: 1.0000 | ORAL_TABLET | Freq: Two times a day (BID) | ORAL | 0 refills | Status: AC

## 2023-09-21 NOTE — ED Triage Notes (Signed)
 Pt arrived POV, caox4, ambulatory NAD stating his PCP told him to come to ED for abx for infection d/t perirectal abscess.

## 2023-09-21 NOTE — ED Notes (Signed)
 Blood cultures and urine sent to lab to hold.

## 2023-09-21 NOTE — ED Provider Notes (Signed)
 [place] EMERGENCY DEPARTMENT AT [facility] Provider Note   CSN: [id number] Arrival date & time: 09/21/23  9070     Patient presents with: Abscess   Henry Schmitt. is a 63 y.o. male.   63 y.o male with a PMH of perirectal abscess presents to the ED with a chief complaint of abscess.  Patient tells me that he was told by his primary care physician in order to be seen about a week ago due to a perirectal abscess as this needed an I&D along with IV antibiotics.  He tells me that he did not come and then because he went on a cruise.  He tells me that today he returns in order to receive IV antibiotics.  This has been an ongoing problem, he is currently not having any pain, no fevers, no chills.  No alleviating or exacerbating factors.  The history is provided by the patient.  Abscess Associated symptoms: no fever        Prior to Admission medications   Medication Sig Start Date End Date Taking? Authorizing Provider  amoxicillin -clavulanate (AUGMENTIN ) 875-125 MG tablet Take 1 tablet by mouth every 12 (twelve) hours for 7 days. 09/21/23 09/28/23 Yes Meshia Rau, PA-C  aspirin  EC 81 MG tablet Take 1 tablet (81 mg total) by mouth daily. Swallow whole. 08/24/23   Milford, Harlene HERO, FNP  atorvastatin  (LIPITOR) 40 MG tablet Take 1 tablet (40 mg total) by mouth daily. 08/24/23   Glena Harlene HERO, FNP  carvedilol  (COREG ) 12.5 MG tablet Take 1 tablet (12.5 mg total) by mouth 2 (two) times daily with a meal. 08/24/23   Milford, Harlene HERO, FNP  clopidogrel  (PLAVIX ) 75 MG tablet Take 1 tablet (75 mg total) by mouth daily. Patient not taking: Reported on 09/11/2023 08/24/23   Glena Harlene HERO, FNP  dapagliflozin  propanediol (FARXIGA ) 10 MG TABS tablet Take 1 tablet (10 mg total) by mouth daily. 08/24/23   Milford, Harlene HERO, FNP  digoxin  (LANOXIN ) 0.125 MG tablet Take 1 tablet (0.125 mg total) by mouth daily. 09/11/23   Milford, Harlene HERO, FNP  eplerenone  (INSPRA ) 25 MG tablet Take 1  tablet (25 mg total) by mouth daily. 08/24/23   Milford, Harlene HERO, FNP  furosemide  (LASIX ) 40 MG tablet Take 1 tablet (40 mg total) by mouth daily. 08/24/23 08/23/24  Glena Harlene HERO, FNP  ibuprofen  (ADVIL ) 200 MG tablet Take 200-600 mg by mouth every 6 (six) hours as needed.    [provider]  losartan  (COZAAR ) 25 MG tablet Take 1 tablet (25 mg total) by mouth daily. 08/24/23 08/23/24  Glena Harlene HERO, FNP  zolpidem (AMBIEN) 10 MG tablet Take 10 mg by mouth at bedtime as needed.    [provider]  albuterol  (PROVENTIL  HFA;VENTOLIN  HFA) 108 (90 Base) MCG/ACT inhaler Inhale 2 puffs into the lungs every 4 (four) hours as needed for wheezing or shortness of breath. Patient not taking: Reported on 09/17/2018 12/27/17 10/31/19  Maranda Jamee Jacob, MD    Allergies: Peanut-containing drug products; Antihistamines, chlorpheniramine-type; and Lisinopril    Review of Systems  Constitutional:  Negative for fever.  Respiratory:  Negative for shortness of breath.   Cardiovascular:  Negative for chest pain.  Gastrointestinal:  Negative for abdominal pain.  Musculoskeletal:  Negative for back pain.  All other systems reviewed and are negative.   Updated Vital Signs BP (!) 140/73   Pulse 76   Temp 98.3 F (36.8 C) (Oral)   Resp 18   Ht 5'  6 (1.676 m)   Wt 83 kg   SpO2 97%   BMI 29.54 kg/m   Physical Exam Vitals and nursing note reviewed. Exam conducted with a chaperone present.  Constitutional:      Appearance: Normal appearance.  HENT:     Head: Normocephalic and atraumatic.     Mouth/Throat:     Mouth: Mucous membranes are moist.  Cardiovascular:     Rate and Rhythm: Normal rate.  Pulmonary:     Effort: Pulmonary effort is normal.  Abdominal:     General: Abdomen is flat.  Genitourinary:    Comments: Chaperoned by Marolyn NT.  Fluctuance and actively draining abscess with no erythema or edema noted.  Musculoskeletal:     Cervical back: Normal range of motion and  neck supple.  Skin:    General: Skin is warm and dry.  Neurological:     Mental Status: He is alert and oriented to person, place, and time.     (all labs ordered are listed, but only abnormal results are displayed) Labs Reviewed  CBC WITH DIFFERENTIAL/PLATELET - Abnormal; Notable for the following components:      Result Value   WBC 12.2 (*)    RBC 3.99 (*)    Hemoglobin 12.2 (*)    HCT 37.5 (*)    Abs Immature Granulocytes 0.09 (*)    All other components within normal limits  COMPREHENSIVE METABOLIC PANEL WITH GFR - Abnormal; Notable for the following components:   Potassium 3.4 (*)    Glucose, Bld 116 (*)    Alkaline Phosphatase 139 (*)    All other components within normal limits  LIPASE, BLOOD  LACTIC ACID, PLASMA  LACTIC ACID, PLASMA    EKG: None  Radiology: No results found.   Procedures   Medications Ordered in the ED - No data to display                                  Medical Decision Making Amount and/or Complexity of Data Reviewed Labs: ordered.  Risk Prescription drug management.    Patient presented to the ED with perirectal abscess that has been ongoing for some time per reports he saw PCP about a week ago, told to come into the ED to rule out sepsis.  This is a recurrent problem for patient, reports he has previously had this drained in clinic, this is actively draining yes I have looked at it with chaperone Art gallery manager tech at bedside.  He is not having any pain right now, no fevers, he is hemodynamically stable.  Blood work reveals CBC with a slight leukocytosis which is chronic for patient.  CMP with no electrolyte abnormality.  Creatinine levels within normal limits.  LFTs are normal.  Lactic acid was negative.  He does not appear toxic, he is eating well.  He does tell me that a week ago he was told by PCP to come to the ED, however he boarded a cruise to [place] and now returns to make sure that he is now not septic.  I do not  suspect he is septic at this time.  This is a chronic problem for patient.  He is not having any pain fever or debilitating symptoms from this.  We did discuss outpatient follow-up with general surgery in order to have this fully excised.  No problems with moving his bowels, no constipation, no diarrhea.  Go home  on a regimen of Augmentin  for the next 7 days.  Hemodynamically stable for discharge.  Portions of this note were generated with Scientist, clinical (histocompatibility and immunogenetics). Dictation errors may occur despite best attempts at proofreading.  Final diagnoses:  Rectal abscess    ED Discharge Orders          Ordered    amoxicillin -clavulanate (AUGMENTIN ) 875-125 MG tablet  Every 12 hours        09/21/23 1204               Gwenette Wellons, PA-C 09/21/23 1217    Ruthe Cornet, DO 09/21/23 1230

## 2023-09-21 NOTE — Discharge Instructions (Signed)
 Your laboratory results are reassuring on today's visit.  You will go home in a short course of Augmentin  to help treat your infection, you ultimately will need to have this removed.  #2 [place] surgery is attached to your chart.  If experience any fever, worsening pain please return to the emergency department.

## 2023-09-24 NOTE — Progress Notes (Unsigned)
 [facility] [address] [place]. [place] [address] [phone]       HOSPITAL FOLLOW UP NOTE  Mr. Henry Schmitt. Date of Birth:  [birth year] Medical Record Number:  [id number]   Reason for Referral:  hospital stroke follow up    SUBJECTIVE:   CHIEF COMPLAINT:  No chief complaint on file.   HPI:   Henry Schmitt is a 63 y.o. male with hx of hypertension who presented to [facility] ED on 08/14/2023 after seeing his PCP and reporting headache, right sided weakness and double vision that started 7/31.  Stroke workup revealed acute right pontine infarct likely secondary to small vessel disease.  Noted diplopia associated with pontine stroke but unclear cause of right sided weakness.  CTA head/neck negative LVO, right ICA 40% stenosis, left ICA 50% stenosis, bilateral ICA siphon moderate stenosis and left V2 moderate stenosis.  EF 30 to 35%.  LDL 93.  A1c ***.  Recommended DAPT for 3 weeks and aspirin  alone as well as initiated atorvastatin  40 mg daily.  Cardiology consulted for cardiomyopathy s/p LHC and felt CHF disproportionate based on findings of LHC and suspected nonischemic cardiomyopathy..  Antihypertensive regimen adjusted.  Tobacco cessation counseling provided.  Therapies recommended outpatient therapy and discharged on 8/8.   Today, 09/25/2023, patient is being seen for initial hospital follow-up.   Has had follow-up visits with cardiology, medication adjustments made and plans on pursuing cardiac MRI as well as repeat echo in 3 months.  Most recently was recommended proceeding with sleep study.  He was started on Chantix by PCP for tobacco cessation    PERTINENT IMAGING  CT head No acute abnormality. CTA head & neck . No large vessel occlusion.  Right ICA 40% stenosed, left ICA 50% stenosis, bilateral ICA siphon moderate stenosis, left V2 moderate stenosis. MRI  Acute right pontine infarct  2D Echo EF 30-35%, no atrial shunts LDL 93 HgbA1c Pending UDS  negative    ROS:   14 system review of systems performed and negative with exception of ***  PMH:  Past Medical History:  Diagnosis Date   Hypertension     PSH:  Past Surgical History:  Procedure Laterality Date   CHOLECYSTECTOMY     RIGHT/LEFT HEART CATH AND CORONARY ANGIOGRAPHY N/A 08/17/2023   Procedure: RIGHT/LEFT HEART CATH AND CORONARY ANGIOGRAPHY;  Surgeon: Swaziland, Peter M, MD;  Location: [facility] INVASIVE CV LAB;  Service: Cardiovascular;  Laterality: N/A;    Social History:  Social History   Socioeconomic History   Marital status: Single    Spouse name: Not on file   Number of children: Not on file   Years of education: Not on file   Highest education level: Not on file  Occupational History   Not on file  Tobacco Use   Smoking status: Every Day    Current packs/day: 0.50    Types: Cigarettes   Smokeless tobacco: Never  Vaping Use   Vaping status: Never Used  Substance and Sexual Activity   Alcohol use: Yes    Comment: 3 times per week: brandy or beer   Drug use: Not Currently   Sexual activity: Never  Other Topics Concern   Not on file  Social History Narrative   Not on file   Social Drivers of Health   Financial Resource Strain: Low Risk  (02/12/2023)   Received from [facility]   Overall Financial Resource Strain (CARDIA)    Difficulty of Paying Living Expenses: Not very hard  Food  Insecurity: No Food Insecurity (08/15/2023)   Hunger Vital Sign    Worried About Running Out of Food in the Last Year: Never true    Ran Out of Food in the Last Year: Never true  Transportation Needs: No Transportation Needs (08/15/2023)   PRAPARE - Administrator, Civil Service (Medical): No    Lack of Transportation (Non-Medical): No  Physical Activity: Unknown (02/12/2023)   Received from [facility]   Exercise Vital Sign    On average, how many days per week do you engage in moderate to strenuous exercise (like a brisk walk)?: 0 days    Minutes of Exercise  per Session: Not on file  Stress: No Stress Concern Present (02/12/2023)   Received from [facility] of Occupational Health - Occupational Stress Questionnaire    Feeling of Stress : Only a little  Social Connections: Moderately Integrated (02/12/2023)   Received from [facility]   Social Network    How would you rate your social network (family, work, friends)?: Adequate participation with social networks  Intimate Partner Violence: Not At Risk (08/15/2023)   Humiliation, Afraid, Rape, and Kick questionnaire    Fear of Current or Ex-Partner: No    Emotionally Abused: No    Physically Abused: No    Sexually Abused: No    Family History:  Family History  Problem Relation Age of Onset   Heart failure Mother    Hypertension Mother    Diabetes Other     Medications:   Current Outpatient Medications on File Prior to Visit  Medication Sig Dispense Refill   amoxicillin -clavulanate (AUGMENTIN ) 875-125 MG tablet Take 1 tablet by mouth every 12 (twelve) hours for 7 days. 14 tablet 0   aspirin  EC 81 MG tablet Take 1 tablet (81 mg total) by mouth daily. Swallow whole. 30 tablet 12   atorvastatin  (LIPITOR) 40 MG tablet Take 1 tablet (40 mg total) by mouth daily. 90 tablet 2   carvedilol  (COREG ) 12.5 MG tablet Take 1 tablet (12.5 mg total) by mouth 2 (two) times daily with a meal. 90 tablet 2   clopidogrel  (PLAVIX ) 75 MG tablet Take 1 tablet (75 mg total) by mouth daily. (Patient not taking: Reported on 09/11/2023) 30 tablet 2   dapagliflozin  propanediol (FARXIGA ) 10 MG TABS tablet Take 1 tablet (10 mg total) by mouth daily. 90 tablet 2   digoxin  (LANOXIN ) 0.125 MG tablet Take 1 tablet (0.125 mg total) by mouth daily. 60 tablet 11   eplerenone  (INSPRA ) 25 MG tablet Take 1 tablet (25 mg total) by mouth daily. 90 tablet 1   furosemide  (LASIX ) 40 MG tablet Take 1 tablet (40 mg total) by mouth daily. 90 tablet 2   ibuprofen  (ADVIL ) 200 MG tablet Take 200-600 mg by mouth every 6  (six) hours as needed.     losartan  (COZAAR ) 25 MG tablet Take 1 tablet (25 mg total) by mouth daily. 90 tablet 1   zolpidem (AMBIEN) 10 MG tablet Take 10 mg by mouth at bedtime as needed.     [DISCONTINUED] albuterol  (PROVENTIL  HFA;VENTOLIN  HFA) 108 (90 Base) MCG/ACT inhaler Inhale 2 puffs into the lungs every 4 (four) hours as needed for wheezing or shortness of breath. (Patient not taking: Reported on 09/17/2018) 1 Inhaler 0   No current facility-administered medications on file prior to visit.    Allergies:   Allergies  Allergen Reactions   Peanut-Containing Drug Products Swelling    ALL TREE NUTS  Antihistamines, Chlorpheniramine-Type Hives and Swelling   Lisinopril Swelling      OBJECTIVE:  Physical Exam  There were no vitals filed for this visit. There is no height or weight on file to calculate BMI. No results found.   General: well developed, well nourished, seated, in no evident distress Head: head normocephalic and atraumatic.   Neck: supple with no carotid or supraclavicular bruits Cardiovascular: regular rate and rhythm, no murmurs Musculoskeletal: no deformity Skin:  no rash/petichiae Vascular:  Normal pulses all extremities   Neurologic Exam Mental Status: Awake and fully alert. Oriented to place and time. Recent and remote memory intact. Attention span, concentration and fund of knowledge appropriate. Mood and affect appropriate.  Cranial Nerves: Fundoscopic exam reveals sharp disc margins. Pupils equal, briskly reactive to light. Extraocular movements full without nystagmus. Visual fields full to confrontation. Hearing intact. Facial sensation intact. Face, tongue, palate moves normally and symmetrically.  Motor: Normal bulk and tone. Normal strength in all tested extremity muscles Sensory.: intact to touch , pinprick , position and vibratory sensation.  Coordination: Rapid alternating movements normal in all extremities. Finger-to-nose and heel-to-shin  performed accurately bilaterally. Gait and Station: Arises from chair without difficulty. Stance is normal. Gait demonstrates normal stride length and balance with ***. Tandem walk and heel toe ***.  Reflexes: 1+ and symmetric. Toes downgoing.     NIHSS  *** Modified Rankin  ***      ASSESSMENT: Henry Schmitt. is a 63 y.o. year old male with right pontine stroke 08/14/2023 likely secondary to small vessel disease. Vascular risk factors include HTN, new onset cardiomyopathy, HLD, carotid stenosis, tobacco use and EtOH use.      PLAN:  Right pontine stroke:  Residual deficit: ***.  Continue aspirin  81mg  daily and atorvastatin  (Lipitor) for secondary stroke prevention managed/prescribed by PCP.   Discussed secondary stroke prevention measures and importance of close PCP follow up for aggressive stroke risk factor management including BP goal<130/90, HLD with LDL goal<70 and DM with A1c.<7 .  Stroke labs 09/2023: LDL 93, A1c 5.3 I have gone over the pathophysiology of stroke, warning signs and symptoms, risk factors and their management in some detail with instructions to go to the closest emergency room for symptoms of concern.     Follow up in *** or call earlier if needed   CC:  [facility] provider: Dr. Rosemarie PCP: [name], PA-C    I personally spent a total of *** minutes in the care of the patient today including {Time Based Coding:210964241}.    Harlene Bogaert, AGNP-BC  [facility] [address] [place], [place] [address]  Phone [phone] Fax [phone] Note: This document was prepared with digital dictation and possible smart phrase technology. Any transcriptional errors that result from this process are unintentional.

## 2023-09-25 ENCOUNTER — Other Ambulatory Visit (HOSPITAL_COMMUNITY)

## 2023-09-25 ENCOUNTER — Ambulatory Visit: Admitting: Adult Health

## 2023-09-25 ENCOUNTER — Encounter: Payer: Self-pay | Admitting: Adult Health

## 2023-09-25 ENCOUNTER — Ambulatory Visit (HOSPITAL_COMMUNITY)
Admission: RE | Admit: 2023-09-25 | Discharge: 2023-09-25 | Disposition: A | Discharge status: Home / Self Care | Source: Ambulatory Visit | Attending: Internal Medicine | Admitting: Internal Medicine

## 2023-09-25 VITALS — BP 158/78 | HR 64 | Ht 66.0 in | Wt 185.8 lb

## 2023-09-25 DIAGNOSIS — I6350 Cerebral infarction due to unspecified occlusion or stenosis of unspecified cerebral artery: Secondary | ICD-10-CM

## 2023-09-25 DIAGNOSIS — G441 Vascular headache, not elsewhere classified: Secondary | ICD-10-CM | POA: Diagnosis not present

## 2023-09-25 DIAGNOSIS — Z720 Tobacco use: Secondary | ICD-10-CM | POA: Diagnosis not present

## 2023-09-25 DIAGNOSIS — I6523 Occlusion and stenosis of bilateral carotid arteries: Secondary | ICD-10-CM

## 2023-09-25 DIAGNOSIS — I5022 Chronic systolic (congestive) heart failure: Secondary | ICD-10-CM

## 2023-09-25 DIAGNOSIS — R400 Somnolence: Secondary | ICD-10-CM

## 2023-09-25 NOTE — Addendum Note (Signed)
 Encounter addended by: Dante Jeannine HERO, CMA on: 09/25/2023 12:49 PM  Actions taken: Order list changed, Diagnosis association updated

## 2023-09-25 NOTE — Patient Instructions (Addendum)
 Continue aspirin  81 mg daily  and atorvastatin  for secondary stroke prevention - refills can be obtained by your PCP  Would recommend you schedule a follow up visit with ophthalmology for full vision exam and to ensure the muscles in your eyes are working correctly   You will be called to schedule a repeat carotid ultrasound around January for monitoring of your carotid stenosis   Continue to follow with cardiology as scheduled  Highly encourage complete tobacco cessation as continued use greatly increases risk of recurrent strokes  Continue to follow up with PCP regarding blood pressure, cholesterol and diabetes management  Maintain strict control of hypertension with blood pressure goal below 130/90, diabetes with hemoglobin A1c goal below 7.0 % and cholesterol with LDL cholesterol (bad cholesterol) goal below 70 mg/dL.   Signs of a Stroke? Follow the BEFAST method:  Balance Watch for a sudden loss of balance, trouble with coordination or vertigo Eyes Is there a sudden loss of vision in one or both eyes? Or double vision?  Face: Ask the person to smile. Does one side of the face droop or is it numb?  Arms: Ask the person to raise both arms. Does one arm drift downward? Is there weakness or numbness of a leg? Speech: Ask the person to repeat a simple phrase. Does the speech sound slurred/strange? Is the person confused ? Time: If you observe any of these signs, call 911.        Thank you for coming to see us  at [facility]. I hope we have been able to provide you high quality care today.  You may receive a patient satisfaction survey over the next few weeks. We would appreciate your feedback and comments so that we may continue to improve ourselves and the health of our patients.

## 2023-09-27 ENCOUNTER — Ambulatory Visit (HOSPITAL_COMMUNITY)
Admission: RE | Admit: 2023-09-27 | Discharge: 2023-09-27 | Disposition: A | Discharge status: Home / Self Care | Source: Ambulatory Visit | Attending: Internal Medicine | Admitting: Internal Medicine

## 2023-09-27 DIAGNOSIS — I5022 Chronic systolic (congestive) heart failure: Secondary | ICD-10-CM | POA: Diagnosis present

## 2023-09-27 LAB — DIGOXIN LEVEL: Digoxin Level: <0.6 ng/mL — ABNORMAL LOW (ref 0.8–2.0)

## 2023-10-02 ENCOUNTER — Telehealth (HOSPITAL_COMMUNITY): Payer: Self-pay

## 2023-10-02 NOTE — Telephone Encounter (Signed)
 Called patient regarding cardiac rehab, patient stated he was busy at the moment and would call us  back.

## 2023-10-02 NOTE — Telephone Encounter (Signed)
 Pt insurance is active and benefits verified through [facility]. Co-pay $0, DED $0/$0 met, out of pocket $0/$0 met, co-insurance 0%. No pre-authorization required. 10/02/2023 @ 11:08am, spoke with Corlesha B., REF# [id number] [id number]. 10/02/2023.  TCR/ICR? TCR (72) Visit(date of service)limitation? No Can multiple codes be used on the same date of service/visit?(IF ITS A LIMIT) N/A  Is this a lifetime maximum or an annual maximum? Annual Has the member used any of these services to date? No Is there a time limit (weeks/months) on start of program and/or program completion? No

## 2023-10-08 ENCOUNTER — Encounter (HOSPITAL_COMMUNITY): Payer: Self-pay

## 2023-10-09 ENCOUNTER — Other Ambulatory Visit (HOSPITAL_COMMUNITY): Payer: Self-pay | Admitting: Family Medicine

## 2023-10-09 ENCOUNTER — Ambulatory Visit (HOSPITAL_COMMUNITY)
Admission: RE | Admit: 2023-10-09 | Discharge: 2023-10-09 | Disposition: A | Discharge status: Home / Self Care | Source: Ambulatory Visit | Attending: Family Medicine | Admitting: Family Medicine

## 2023-10-09 DIAGNOSIS — I5022 Chronic systolic (congestive) heart failure: Secondary | ICD-10-CM | POA: Diagnosis present

## 2023-10-09 MED ORDER — GADOBUTROL 1 MMOL/ML IV SOLN
10.0000 mL | Freq: Once | INTRAVENOUS | Status: AC | PRN
  Administered 2023-10-09: 10 mL via INTRAVENOUS

## 2023-10-11 LAB — COLOGUARD: COLOGUARD: NEGATIVE

## 2023-10-11 NOTE — Progress Notes (Signed)
 I agree with the above plan

## 2023-10-16 ENCOUNTER — Telehealth (HOSPITAL_COMMUNITY): Payer: Self-pay

## 2023-10-16 NOTE — Telephone Encounter (Signed)
 Called patient to see if he was interested in participating in the Cardiac Rehab Program. Patient will come in for orientation on 10/21 and will attend the 10:15 exercise class.  Sent MyChart message.

## 2023-10-17 ENCOUNTER — Other Ambulatory Visit (HOSPITAL_COMMUNITY): Payer: Self-pay

## 2023-10-22 ENCOUNTER — Other Ambulatory Visit: Payer: Self-pay

## 2023-10-22 ENCOUNTER — Emergency Department (HOSPITAL_BASED_OUTPATIENT_CLINIC_OR_DEPARTMENT_OTHER)
Admission: EM | Admit: 2023-10-22 | Discharge: 2023-10-23 | Disposition: A | Discharge status: Home / Self Care | Source: Ambulatory Visit | Attending: Emergency Medicine | Admitting: Emergency Medicine

## 2023-10-22 ENCOUNTER — Emergency Department (HOSPITAL_BASED_OUTPATIENT_CLINIC_OR_DEPARTMENT_OTHER)

## 2023-10-22 DIAGNOSIS — E876 Hypokalemia: Secondary | ICD-10-CM | POA: Insufficient documentation

## 2023-10-22 DIAGNOSIS — I10 Essential (primary) hypertension: Secondary | ICD-10-CM | POA: Insufficient documentation

## 2023-10-22 DIAGNOSIS — R809 Proteinuria, unspecified: Secondary | ICD-10-CM | POA: Insufficient documentation

## 2023-10-22 DIAGNOSIS — Z79899 Other long term (current) drug therapy: Secondary | ICD-10-CM | POA: Diagnosis not present

## 2023-10-22 DIAGNOSIS — Z91010 Allergy to peanuts: Secondary | ICD-10-CM | POA: Diagnosis not present

## 2023-10-22 DIAGNOSIS — R112 Nausea with vomiting, unspecified: Secondary | ICD-10-CM | POA: Insufficient documentation

## 2023-10-22 DIAGNOSIS — Z7982 Long term (current) use of aspirin: Secondary | ICD-10-CM | POA: Diagnosis not present

## 2023-10-22 DIAGNOSIS — D72829 Elevated white blood cell count, unspecified: Secondary | ICD-10-CM | POA: Insufficient documentation

## 2023-10-22 LAB — CBC WITH DIFFERENTIAL/PLATELET
Abs Immature Granulocytes: 0.32 K/uL — ABNORMAL HIGH (ref 0.00–0.07)
Basophils Absolute: 0.1 K/uL (ref 0.0–0.1)
Basophils Relative: 1 %
Eosinophils Absolute: 0.2 K/uL (ref 0.0–0.5)
Eosinophils Relative: 1 %
HCT: 34.7 % — ABNORMAL LOW (ref 39.0–52.0)
Hemoglobin: 11.6 g/dL — ABNORMAL LOW (ref 13.0–17.0)
Immature Granulocytes: 2 %
Lymphocytes Relative: 19 %
Lymphs Abs: 3.9 K/uL (ref 0.7–4.0)
MCH: 30.2 pg (ref 26.0–34.0)
MCHC: 33.4 g/dL (ref 30.0–36.0)
MCV: 90.4 fL (ref 80.0–100.0)
Monocytes Absolute: 1.5 K/uL — ABNORMAL HIGH (ref 0.1–1.0)
Monocytes Relative: 7 %
Neutro Abs: 15 K/uL — ABNORMAL HIGH (ref 1.7–7.7)
Neutrophils Relative %: 70 %
Platelets: 402 K/uL — ABNORMAL HIGH (ref 150–400)
RBC: 3.84 MIL/uL — ABNORMAL LOW (ref 4.22–5.81)
RDW: 13.4 % (ref 11.5–15.5)
WBC: 21 K/uL — ABNORMAL HIGH (ref 4.0–10.5)
nRBC: 0 % (ref 0.0–0.2)

## 2023-10-22 LAB — URINALYSIS, W/ REFLEX TO CULTURE (INFECTION SUSPECTED)
Bacteria, UA: NONE SEEN
Bilirubin Urine: NEGATIVE
Glucose, UA: NEGATIVE mg/dL
Ketones, ur: NEGATIVE mg/dL
Nitrite: NEGATIVE
Protein, ur: 30 mg/dL — AB
Specific Gravity, Urine: 1.017 (ref 1.005–1.030)
pH: 5.5 (ref 5.0–8.0)

## 2023-10-22 LAB — COMPREHENSIVE METABOLIC PANEL WITH GFR
ALT: 45 U/L — ABNORMAL HIGH (ref 0–44)
AST: 37 U/L (ref 15–41)
Albumin: 3.7 g/dL (ref 3.5–5.0)
Alkaline Phosphatase: 149 U/L — ABNORMAL HIGH (ref 38–126)
Anion gap: 14 (ref 5–15)
BUN: 12 mg/dL (ref 8–23)
CO2: 26 mmol/L (ref 22–32)
Calcium: 9.8 mg/dL (ref 8.9–10.3)
Chloride: 96 mmol/L — ABNORMAL LOW (ref 98–111)
Creatinine, Ser: 1.12 mg/dL (ref 0.61–1.24)
GFR, Estimated: >60 mL/min (ref >60)
Glucose, Bld: 190 mg/dL — ABNORMAL HIGH (ref 70–99)
Potassium: 2.8 mmol/L — ABNORMAL LOW (ref 3.5–5.1)
Sodium: 136 mmol/L (ref 135–145)
Total Bilirubin: 0.5 mg/dL (ref 0.0–1.2)
Total Protein: 7.7 g/dL (ref 6.5–8.1)

## 2023-10-22 LAB — LACTIC ACID, PLASMA: Lactic Acid, Venous: 1.2 mmol/L (ref 0.5–1.9)

## 2023-10-22 MED ORDER — IOHEXOL 300 MG/ML  SOLN
100.0000 mL | Freq: Once | INTRAMUSCULAR | Status: AC | PRN
  Administered 2023-10-22: 100 mL via INTRAVENOUS

## 2023-10-22 NOTE — ED Triage Notes (Signed)
 Pt POV reporting persistent n/v following treatment for UTI last week. Follow up blood work at PCP today showed elevated white count, concerned for sepsis.

## 2023-10-23 LAB — URINALYSIS, ROUTINE W REFLEX MICROSCOPIC
Bacteria, UA: NONE SEEN
Bilirubin Urine: NEGATIVE
Glucose, UA: NEGATIVE mg/dL
Ketones, ur: NEGATIVE mg/dL
Leukocytes,Ua: NEGATIVE
Nitrite: NEGATIVE
Specific Gravity, Urine: >1.046 — ABNORMAL HIGH (ref 1.005–1.030)
pH: 6.5 (ref 5.0–8.0)

## 2023-10-23 MED ORDER — MAGNESIUM SULFATE 2 GM/50ML IV SOLN
2.0000 g | Freq: Once | INTRAVENOUS | Status: AC
  Administered 2023-10-23: 2 g via INTRAVENOUS
  Filled 2023-10-23: qty 50

## 2023-10-23 MED ORDER — ONDANSETRON HCL 4 MG/2ML IJ SOLN
4.0000 mg | Freq: Once | INTRAMUSCULAR | Status: AC
  Administered 2023-10-23: 4 mg via INTRAVENOUS
  Filled 2023-10-23: qty 2

## 2023-10-23 MED ORDER — POTASSIUM CHLORIDE CRYS ER 20 MEQ PO TBCR
60.0000 meq | EXTENDED_RELEASE_TABLET | Freq: Once | ORAL | Status: AC
  Administered 2023-10-23: 60 meq via ORAL
  Filled 2023-10-23: qty 3

## 2023-10-23 MED ORDER — POTASSIUM CHLORIDE CRYS ER 20 MEQ PO TBCR: 20.0000 meq | EXTENDED_RELEASE_TABLET | Freq: Two times a day (BID) | ORAL | 0 refills | Status: DC

## 2023-10-23 MED ORDER — POTASSIUM CHLORIDE 10 MEQ/100ML IV SOLN: 10.0000 meq | Freq: Once | INTRAVENOUS | Status: DC

## 2023-10-23 NOTE — ED Provider Notes (Signed)
 [place] [facility]MENT AT [facility] Provider Note   CSN: [id number] Arrival date & time: 10/22/23  1901     Patient presents with: Emesis   Henry Schmitt. is a 63 y.o. male.   The history is provided by the patient.  Emesis Severity:  Mild Duration:  3 days Timing:  Rare (one episode on thursdaty on episode yesterday) Quality:  Stomach contents Progression:  Partially resolved Chronicity:  New Recent urination:  Normal Context: not post-tussive   Relieved by:  Nothing Worsened by:  Nothing Ineffective treatments:  None tried Associated symptoms: no abdominal pain, no arthralgias, no chills, no cough, no diarrhea, no fever, no headaches, no sore throat and no URI   Risk factors: no prior abdominal surgery   Patient with HTN treated for UTi and then had emesis x 2 and sent in by PMD for same.      Past Medical History:  Diagnosis Date   Hypertension      Prior to Admission medications   Medication Sig Start Date End Date Taking? Authorizing Provider  potassium chloride  SA (KLOR-CON  M) 20 MEQ tablet Take 1 tablet (20 mEq total) by mouth 2 (two) times daily. 10/23/23  Yes Tin Engram, MD  aspirin  EC 81 MG tablet Take 1 tablet (81 mg total) by mouth daily. Swallow whole. 08/24/23   Milford, Harlene HERO, FNP  atorvastatin  (LIPITOR) 40 MG tablet Take 1 tablet (40 mg total) by mouth daily. 08/24/23   Glena Harlene HERO, FNP  carvedilol  (COREG ) 12.5 MG tablet Take 1 tablet (12.5 mg total) by mouth 2 (two) times daily with a meal. 08/24/23   Milford, Harlene HERO, FNP  dapagliflozin  propanediol (FARXIGA ) 10 MG TABS tablet Take 1 tablet (10 mg total) by mouth daily. 08/24/23   Milford, Harlene HERO, FNP  digoxin  (LANOXIN ) 0.125 MG tablet Take 1 tablet (0.125 mg total) by mouth daily. 09/11/23   Milford, Harlene HERO, FNP  eplerenone  (INSPRA ) 25 MG tablet Take 1 tablet (25 mg total) by mouth daily. 08/24/23   Milford, Harlene HERO, FNP  furosemide  (LASIX ) 40 MG tablet Take  1 tablet (40 mg total) by mouth daily. 08/24/23 08/23/24  Glena Harlene HERO, FNP  ibuprofen  (ADVIL ) 200 MG tablet Take 200-600 mg by mouth every 6 (six) hours as needed.    [provider]  losartan  (COZAAR ) 25 MG tablet Take 1 tablet (25 mg total) by mouth daily. 08/24/23 08/23/24  Glena Harlene HERO, FNP  zolpidem (AMBIEN) 10 MG tablet Take 10 mg by mouth at bedtime as needed.    [provider]  albuterol  (PROVENTIL  HFA;VENTOLIN  HFA) 108 (90 Base) MCG/ACT inhaler Inhale 2 puffs into the lungs every 4 (four) hours as needed for wheezing or shortness of breath. Patient not taking: Reported on 09/17/2018 12/27/17 10/31/19  Maranda Jamee Jacob, MD    Allergies: Peanut-containing drug products; Antihistamines, chlorpheniramine-type; and Lisinopril    Review of Systems  Constitutional:  Negative for chills, fatigue, fever and unexpected weight change.  HENT:  Negative for sore throat.   Eyes:  Negative for photophobia.  Respiratory:  Negative for cough.   Gastrointestinal:  Positive for vomiting. Negative for abdominal pain and diarrhea.  Genitourinary:  Negative for dysuria.  Musculoskeletal:  Negative for arthralgias.  Neurological:  Negative for weakness and headaches.  All other systems reviewed and are negative.   Updated Vital Signs BP (!) 146/66   Pulse 72   Temp 98.2 F (36.8 C)   Resp 17  Ht 5' 6 (1.676 m)   Wt 77.6 kg   SpO2 96%   BMI 27.60 kg/m   Physical Exam Vitals and nursing note reviewed.  Constitutional:      General: He is not in acute distress.    Appearance: Normal appearance. He is well-developed. He is not diaphoretic.  HENT:     Head: Normocephalic and atraumatic.     Nose: Nose normal.     Mouth/Throat:     Mouth: Mucous membranes are moist.     Pharynx: Oropharynx is clear.  Eyes:     Conjunctiva/sclera: Conjunctivae normal.     Pupils: Pupils are equal, round, and reactive to light.  Cardiovascular:     Rate and Rhythm: Normal rate  and regular rhythm.     Pulses: Normal pulses.     Heart sounds: Normal heart sounds.  Pulmonary:     Effort: Pulmonary effort is normal. No respiratory distress.     Breath sounds: Normal breath sounds. No wheezing or rales.  Abdominal:     General: Bowel sounds are normal.     Palpations: Abdomen is soft.     Tenderness: There is no abdominal tenderness. There is no guarding or rebound.  Musculoskeletal:        General: Normal range of motion.     Cervical back: Normal range of motion and neck supple.  Skin:    General: Skin is warm and dry.     Capillary Refill: Capillary refill takes less than 2 seconds.  Neurological:     General: No focal deficit present.     Mental Status: He is alert and oriented to person, place, and time.     Deep Tendon Reflexes: Reflexes normal.  Psychiatric:        Mood and Affect: Mood normal.     (all labs ordered are listed, but only abnormal results are displayed) Results for orders placed or performed during the hospital encounter of 10/22/23  Lactic acid, plasma   Collection Time: 10/22/23  7:15 PM  Result Value Ref Range   Lactic Acid, Venous 1.2 0.5 - 1.9 mmol/L  Comprehensive metabolic panel   Collection Time: 10/22/23  7:15 PM  Result Value Ref Range   Sodium 136 135 - 145 mmol/L   Potassium 2.8 (L) 3.5 - 5.1 mmol/L   Chloride 96 (L) 98 - 111 mmol/L   CO2 26 22 - 32 mmol/L   Glucose, Bld 190 (H) 70 - 99 mg/dL   BUN 12 8 - 23 mg/dL   Creatinine, Ser 8.87 0.61 - 1.24 mg/dL   Calcium  9.8 8.9 - 10.3 mg/dL   Total Protein 7.7 6.5 - 8.1 g/dL   Albumin 3.7 3.5 - 5.0 g/dL   AST 37 15 - 41 U/L   ALT 45 (H) 0 - 44 U/L   Alkaline Phosphatase 149 (H) 38 - 126 U/L   Total Bilirubin 0.5 0.0 - 1.2 mg/dL   GFR, Estimated >39 >39 mL/min   Anion gap 14 5 - 15  CBC with Differential   Collection Time: 10/22/23  7:15 PM  Result Value Ref Range   WBC 21.0 (H) 4.0 - 10.5 K/uL   RBC 3.84 (L) 4.22 - 5.81 MIL/uL   Hemoglobin 11.6 (L) 13.0 - 17.0  g/dL   HCT 65.2 (L) 60.9 - 47.9 %   MCV 90.4 80.0 - 100.0 fL   MCH 30.2 26.0 - 34.0 pg   MCHC 33.4 30.0 - 36.0 g/dL   RDW 13.4  11.5 - 15.5 %   Platelets 402 (H) 150 - 400 K/uL   nRBC 0.0 0.0 - 0.2 %   Neutrophils Relative % 70 %   Neutro Abs 15.0 (H) 1.7 - 7.7 K/uL   Lymphocytes Relative 19 %   Lymphs Abs 3.9 0.7 - 4.0 K/uL   Monocytes Relative 7 %   Monocytes Absolute 1.5 (H) 0.1 - 1.0 K/uL   Eosinophils Relative 1 %   Eosinophils Absolute 0.2 0.0 - 0.5 K/uL   Basophils Relative 1 %   Basophils Absolute 0.1 0.0 - 0.1 K/uL   Immature Granulocytes 2 %   Abs Immature Granulocytes 0.32 (H) 0.00 - 0.07 K/uL  Urinalysis, w/ Reflex to Culture (Infection Suspected) -Urine, Clean Catch   Collection Time: 10/22/23  9:15 PM  Result Value Ref Range   Specimen Source URINE, CLEAN CATCH    Color, Urine YELLOW YELLOW   APPearance CLEAR CLEAR   Specific Gravity, Urine 1.017 1.005 - 1.030   pH 5.5 5.0 - 8.0   Glucose, UA NEGATIVE NEGATIVE mg/dL   Hgb urine dipstick MODERATE (A) NEGATIVE   Bilirubin Urine NEGATIVE NEGATIVE   Ketones, ur NEGATIVE NEGATIVE mg/dL   Protein, ur 30 (A) NEGATIVE mg/dL   Nitrite NEGATIVE NEGATIVE   Leukocytes,Ua SMALL (A) NEGATIVE   RBC / HPF 0-5 0 - 5 RBC/hpf   WBC, UA 11-20 0 - 5 WBC/hpf   Bacteria, UA NONE SEEN NONE SEEN   Squamous Epithelial / HPF 0-5 0 - 5 /HPF   Mucus PRESENT    Hyaline Casts, UA PRESENT   Urinalysis, Routine w reflex microscopic -Urine, Clean Catch   Collection Time: 10/23/23 12:58 AM  Result Value Ref Range   Color, Urine YELLOW YELLOW   APPearance CLEAR CLEAR   Specific Gravity, Urine >1.046 (H) 1.005 - 1.030   pH 6.5 5.0 - 8.0   Glucose, UA NEGATIVE NEGATIVE mg/dL   Hgb urine dipstick SMALL (A) NEGATIVE   Bilirubin Urine NEGATIVE NEGATIVE   Ketones, ur NEGATIVE NEGATIVE mg/dL   Protein, ur TRACE (A) NEGATIVE mg/dL   Nitrite NEGATIVE NEGATIVE   Leukocytes,Ua NEGATIVE NEGATIVE   RBC / HPF 6-10 0 - 5 RBC/hpf   WBC, UA 6-10 0 -  5 WBC/hpf   Bacteria, UA NONE SEEN NONE SEEN   Squamous Epithelial / HPF 0-5 0 - 5 /HPF   CT ABDOMEN PELVIS W CONTRAST Result Date: 10/22/2023 CLINICAL DATA:  Polytrauma, blunt EXAM: CT ABDOMEN AND PELVIS WITH CONTRAST TECHNIQUE: Multidetector CT imaging of the abdomen and pelvis was performed using the standard protocol following bolus administration of intravenous contrast. RADIATION DOSE REDUCTION: This exam was performed according to the departmental dose-optimization program which includes automated exposure control, adjustment of the mA and/or kV according to patient size and/or use of iterative reconstruction technique. CONTRAST:  OMNIPAQUE  IOHEXOL  300 MG/ML  SOLN COMPARISON:  10/31/2019 FINDINGS: Lower chest: No acute findings Hepatobiliary: No focal liver abnormality is seen. Status post cholecystectomy. No biliary dilatation. Pancreas: No focal abnormality or ductal dilatation. Spleen: No focal abnormality.  Normal size. Adrenals/Urinary Tract: Adrenal glands normal. 2 cm cyst in the midpole of the left kidney. No follow-up imaging recommended. No stones or hydronephrosis. Urinary bladder unremarkable. Stomach/Bowel: Normal appendix. Left colonic diverticulosis. No active diverticulitis. Stomach and small bowel decompressed. Vascular/Lymphatic: Aortic atherosclerosis. No evidence of aneurysm or adenopathy. Reproductive: No visible focal abnormality. Other: No free fluid or free air. Musculoskeletal: No acute bony abnormality. IMPRESSION: 1. No acute findings in  the abdomen or pelvis. 2. Aortic atherosclerosis. 3. Left colonic diverticulosis. Electronically Signed   By: Franky Crease M.D.   On: 10/22/2023 23:21   MR CARDIAC MORPHOLOGY W WO CONTRAST Result Date: 10/10/2023 CLINICAL DATA:  Heart failure, systolic, determine etiology COMPARISON: Echo 08/14/23 EXAM: MR CARDIA MORPHOLOGY WITHOUT AND WITH CONTRAST; MR CARDIAC VELOCITY FLOW MAPPING TECHNIQUE: The patient was scanned on a 1.5 Tesla  Siemens magnet. A dedicated cardiac coil was used. Functional imaging was done using TrueFisp sequences. 2,3, and 4 chamber views were done to assess for RWMA's. Modified Simpson's rule using a short axis stack was used to calculate an ejection fraction on a dedicated work [employer]. The patient received 10mL GADAVIST GADOBUTROL 1 MMOL/ML IV SOLN. After 10 minutes inversion recovery sequences were used to assess for infiltration and scar tissue. Phase contrast velocity encoded images obtained x 2. This examination is tailored for evaluation cardiac anatomy and function and provides very limited assessment of noncardiac structures, which are accordingly not evaluated during interpretation. If there is clinical concern for extracardiac pathology, further evaluation with CT imaging should be considered. FINDINGS: LEFT VENTRICLE: Left ventricular chamber size: Normal. Left ventricular wall thickness: Mildly increased. Maximal wall thickness 12 mm. Left ventricular systolic function: Mildly reduced LVEF = 46% Mid anterior wall hypokinesis. No myocardial edema, T2 = 47 msec Normal first pass perfusion. There is post contrast delayed myocardial enhancement: Mid anterior wall subendocardial and midmyocardial delayed enhancement, ~50% of myocardial thickness. Focal in the mid ventricle. Suggestive of prior infarct. Normal T1 myocardial nulling kinetics suggest against a diagnosis of cardiac amyloidosis. ECV = 27% RIGHT VENTRICLE: Normal right ventricular chamber size. Normal right ventricular wall thickness. Normal right ventricular systolic function. RVEF = 52% There are no regional wall motion abnormalities. No post contrast delayed myocardial enhancement. ATRIA: Normal PERICARDIUM: Normal pericardium.  No pericardial effusion. OTHER: No significant extracardiac findings. MEASUREMENTS: Qp/Qs: 1.0 VALVES: Aortic valve regurgitation: Trivial, regurgitant fraction 2% Pulmonary valve regurgitation: Trivial,  regurgitant fraction 2% Mitral valve regurgitation: None Tricuspid valve regurgitation: none Left ventricle: LV male LV EF: 46% (normal 49-79%) Absolute volumes: LV EDV: (Normal 95-215 mL) LV ESV: 75mL (Normal 25-85 mL) LV SV: 63mL (Normal 61-145 mL) CO: 3.6L/min (Normal 3.4-7.8 L/min) Indexed volumes: CI: 1.8L/min/sq-m (Normal 1.8-4.2 L/min/sq-m) LV EDV: 10mL/sq-m (Normal 50-108 mL/sq-m) LV ESV: 24mL/sq-m (Normal 11-47 mL/sq-m) LV SV: 44mL/sq-m (Normal 33-72 mL/sq-m) Right ventricle: RV male RV EF:  52% (Normal 51-80%) Absolute volumes: RV EDV: (Normal 109-217 mL) RV ESV: 55mL (Normal 23-91 mL) RV SV: 60mL (Normal 71-141 mL) CO: 3.4L/min (Normal 2.8-8.8 L/min) Indexed volumes: CI: 1.7L/min/sq-m (Normal 1.7-4.2 L/min/sq-m) RV EDV: 81mL/sq-m (Normal 58-109 mL/sq-m) RV ESV: 24mL/sq-m (Normal 12-46 mL/sq-m) RV SV: 20mL/sq-m (Normal 38-71 mL/sq-m) IMPRESSION: 1. Mildly reduced left ventricular systolic function with normal left ventricular chamber size. LVEF 46%. Focal mid anterior wall infarct with possible viability. No evidence of infiltrative myocardial process. 2. Normal right ventricular systolic function with normal RV chamber size. RVEF 52%. Electronically Signed   By: Soyla Merck M.D.   On: 10/10/2023 15:50   MR CARDIAC VELOCITY FLOW MAP Result Date: 10/10/2023 CLINICAL DATA:  Heart failure, systolic, determine etiology COMPARISON: Echo 08/14/23 EXAM: MR CARDIA MORPHOLOGY WITHOUT AND WITH CONTRAST; MR CARDIAC VELOCITY FLOW MAPPING TECHNIQUE: The patient was scanned on a 1.5 Tesla Siemens magnet. A dedicated cardiac coil was used. Functional imaging was done using TrueFisp sequences. 2,3, and 4 chamber views were done to assess for RWMA's. Modified Simpson's  rule using a short axis stack was used to calculate an ejection fraction on a dedicated work [employer], [employer]. The patient received 10mL GADAVIST GADOBUTROL 1 MMOL/ML IV SOLN. After 10 minutes inversion recovery sequences were  used to assess for infiltration and scar tissue. Phase contrast velocity encoded images obtained x 2. This examination is tailored for evaluation cardiac anatomy and function and provides very limited assessment of noncardiac structures, which are accordingly not evaluated during interpretation. If there is clinical concern for extracardiac pathology, further evaluation with CT imaging should be considered. FINDINGS: LEFT VENTRICLE: Left ventricular chamber size: Normal. Left ventricular wall thickness: Mildly increased. Maximal wall thickness 12 mm. Left ventricular systolic function: Mildly reduced LVEF = 46% Mid anterior wall hypokinesis. No myocardial edema, T2 = 47 msec Normal first pass perfusion. There is post contrast delayed myocardial enhancement: Mid anterior wall subendocardial and midmyocardial delayed enhancement, ~50% of myocardial thickness. Focal in the mid ventricle. Suggestive of prior infarct. Normal T1 myocardial nulling kinetics suggest against a diagnosis of cardiac amyloidosis. ECV = 27% RIGHT VENTRICLE: Normal right ventricular chamber size. Normal right ventricular wall thickness. Normal right ventricular systolic function. RVEF = 52% There are no regional wall motion abnormalities. No post contrast delayed myocardial enhancement. ATRIA: Normal PERICARDIUM: Normal pericardium.  No pericardial effusion. OTHER: No significant extracardiac findings. MEASUREMENTS: Qp/Qs: 1.0 VALVES: Aortic valve regurgitation: Trivial, regurgitant fraction 2% Pulmonary valve regurgitation: Trivial, regurgitant fraction 2% Mitral valve regurgitation: None Tricuspid valve regurgitation: none Left ventricle: LV male LV EF: 46% (normal 49-79%) Absolute volumes: LV EDV: (Normal 95-215 mL) LV ESV: 75mL (Normal 25-85 mL) LV SV: 63mL (Normal 61-145 mL) CO: 3.6L/min (Normal 3.4-7.8 L/min) Indexed volumes: CI: 1.8L/min/sq-m (Normal 1.8-4.2 L/min/sq-m) LV EDV: 23mL/sq-m (Normal 50-108 mL/sq-m) LV ESV: 35mL/sq-m  (Normal 11-47 mL/sq-m) LV SV: 54mL/sq-m (Normal 33-72 mL/sq-m) Right ventricle: RV male RV EF:  52% (Normal 51-80%) Absolute volumes: RV EDV: (Normal 109-217 mL) RV ESV: 55mL (Normal 23-91 mL) RV SV: 60mL (Normal 71-141 mL) CO: 3.4L/min (Normal 2.8-8.8 L/min) Indexed volumes: CI: 1.7L/min/sq-m (Normal 1.7-4.2 L/min/sq-m) RV EDV: 73mL/sq-m (Normal 58-109 mL/sq-m) RV ESV: 66mL/sq-m (Normal 12-46 mL/sq-m) RV SV: 31mL/sq-m (Normal 38-71 mL/sq-m) IMPRESSION: 1. Mildly reduced left ventricular systolic function with normal left ventricular chamber size. LVEF 46%. Focal mid anterior wall infarct with possible viability. No evidence of infiltrative myocardial process. 2. Normal right ventricular systolic function with normal RV chamber size. RVEF 52%. Electronically Signed   By: Soyla Merck M.D.   On: 10/10/2023 15:50   MR CARDIAC VELOCITY FLOW MAP Result Date: 10/10/2023 CLINICAL DATA:  Heart failure, systolic, determine etiology COMPARISON: Echo 08/14/23 EXAM: MR CARDIA MORPHOLOGY WITHOUT AND WITH CONTRAST; MR CARDIAC VELOCITY FLOW MAPPING TECHNIQUE: The patient was scanned on a 1.5 Tesla Siemens magnet. A dedicated cardiac coil was used. Functional imaging was done using TrueFisp sequences. 2,3, and 4 chamber views were done to assess for RWMA's. Modified Simpson's rule using a short axis stack was used to calculate an ejection fraction on a dedicated work [employer] officer, [employer]. The patient received 10mL GADAVIST GADOBUTROL 1 MMOL/ML IV SOLN. After 10 minutes inversion recovery sequences were used to assess for infiltration and scar tissue. Phase contrast velocity encoded images obtained x 2. This examination is tailored for evaluation cardiac anatomy and function and provides very limited assessment of noncardiac structures, which are accordingly not evaluated during interpretation. If there is clinical concern for extracardiac pathology, further evaluation with CT imaging should be considered.  FINDINGS:  LEFT VENTRICLE: Left ventricular chamber size: Normal. Left ventricular wall thickness: Mildly increased. Maximal wall thickness 12 mm. Left ventricular systolic function: Mildly reduced LVEF = 46% Mid anterior wall hypokinesis. No myocardial edema, T2 = 47 msec Normal first pass perfusion. There is post contrast delayed myocardial enhancement: Mid anterior wall subendocardial and midmyocardial delayed enhancement, ~50% of myocardial thickness. Focal in the mid ventricle. Suggestive of prior infarct. Normal T1 myocardial nulling kinetics suggest against a diagnosis of cardiac amyloidosis. ECV = 27% RIGHT VENTRICLE: Normal right ventricular chamber size. Normal right ventricular wall thickness. Normal right ventricular systolic function. RVEF = 52% There are no regional wall motion abnormalities. No post contrast delayed myocardial enhancement. ATRIA: Normal PERICARDIUM: Normal pericardium.  No pericardial effusion. OTHER: No significant extracardiac findings. MEASUREMENTS: Qp/Qs: 1.0 VALVES: Aortic valve regurgitation: Trivial, regurgitant fraction 2% Pulmonary valve regurgitation: Trivial, regurgitant fraction 2% Mitral valve regurgitation: None Tricuspid valve regurgitation: none Left ventricle: LV male LV EF: 46% (normal 49-79%) Absolute volumes: LV EDV: (Normal 95-215 mL) LV ESV: 75mL (Normal 25-85 mL) LV SV: 63mL (Normal 61-145 mL) CO: 3.6L/min (Normal 3.4-7.8 L/min) Indexed volumes: CI: 1.8L/min/sq-m (Normal 1.8-4.2 L/min/sq-m) LV EDV: 61mL/sq-m (Normal 50-108 mL/sq-m) LV ESV: 45mL/sq-m (Normal 11-47 mL/sq-m) LV SV: 77mL/sq-m (Normal 33-72 mL/sq-m) Right ventricle: RV male RV EF:  52% (Normal 51-80%) Absolute volumes: RV EDV: (Normal 109-217 mL) RV ESV: 55mL (Normal 23-91 mL) RV SV: 60mL (Normal 71-141 mL) CO: 3.4L/min (Normal 2.8-8.8 L/min) Indexed volumes: CI: 1.7L/min/sq-m (Normal 1.7-4.2 L/min/sq-m) RV EDV: 80mL/sq-m (Normal 58-109 mL/sq-m) RV ESV: 20mL/sq-m (Normal 12-46 mL/sq-m) RV  SV: 70mL/sq-m (Normal 38-71 mL/sq-m) IMPRESSION: 1. Mildly reduced left ventricular systolic function with normal left ventricular chamber size. LVEF 46%. Focal mid anterior wall infarct with possible viability. No evidence of infiltrative myocardial process. 2. Normal right ventricular systolic function with normal RV chamber size. RVEF 52%. Electronically Signed   By: Soyla Merck M.D.   On: 10/10/2023 15:50     Radiology: CT ABDOMEN PELVIS W CONTRAST Result Date: 10/22/2023 CLINICAL DATA:  Polytrauma, blunt EXAM: CT ABDOMEN AND PELVIS WITH CONTRAST TECHNIQUE: Multidetector CT imaging of the abdomen and pelvis was performed using the standard protocol following bolus administration of intravenous contrast. RADIATION DOSE REDUCTION: This exam was performed according to the departmental dose-optimization program which includes automated exposure control, adjustment of the mA and/or kV according to patient size and/or use of iterative reconstruction technique. CONTRAST:  OMNIPAQUE  IOHEXOL  300 MG/ML  SOLN COMPARISON:  10/31/2019 FINDINGS: Lower chest: No acute findings Hepatobiliary: No focal liver abnormality is seen. Status post cholecystectomy. No biliary dilatation. Pancreas: No focal abnormality or ductal dilatation. Spleen: No focal abnormality.  Normal size. Adrenals/Urinary Tract: Adrenal glands normal. 2 cm cyst in the midpole of the left kidney. No follow-up imaging recommended. No stones or hydronephrosis. Urinary bladder unremarkable. Stomach/Bowel: Normal appendix. Left colonic diverticulosis. No active diverticulitis. Stomach and small bowel decompressed. Vascular/Lymphatic: Aortic atherosclerosis. No evidence of aneurysm or adenopathy. Reproductive: No visible focal abnormality. Other: No free fluid or free air. Musculoskeletal: No acute bony abnormality. IMPRESSION: 1. No acute findings in the abdomen or pelvis. 2. Aortic atherosclerosis. 3. Left colonic diverticulosis. Electronically  Signed   By: Franky Crease M.D.   On: 10/22/2023 23:21     Procedures   Medications Ordered in the ED  iohexol  (OMNIPAQUE ) 300 MG/ML solution 100 mL (100 mLs Intravenous Contrast Given 10/22/23 2312)  magnesium  sulfate IVPB 2 g 50 mL (0 g Intravenous Stopped 10/23/23 0102)  potassium chloride  SA (KLOR-CON  M) CR tablet 60 mEq (60 mEq Oral Given 10/23/23 0042)  ondansetron  (ZOFRAN ) injection 4 mg (4 mg Intravenous Given 10/23/23 0043)                                    Medical Decision Making Patient with emesis post treatment for UTi  Amount and/or Complexity of Data Reviewed External Data Reviewed: notes.    Details: Previous notes revieweed  Labs: ordered.    Details: Urine has protein but no signs of infection.  White count elevated 21, hemoglobin low 11.6, platelets 402 .  Normal sodium, potassium low 2.8, normal creatinine 1.12 Radiology: ordered and independent interpretation performed.    Details: Normal CT abdomen   Risk Prescription drug management. Risk Details: Patient treated for UTi.  Suspect emesis is a side effect of medication.  White count likely secondary to demargination from repeat emesis.  There are no signs of sepsis on vitals or exam.  CT scan is negative urine is now negative.  Will have patient follow up with PMD for recheck.  Continue all antibiotics.  Follow up with [facility] for proteinuria.  Stable for discharge.  Strict returns.      Final diagnoses:  Nausea and vomiting, unspecified vomiting type  Leukocytosis, unspecified type  Hypokalemia  Proteinuria, unspecified type    No signs of systemic illness or infection. The patient is nontoxic-appearing on exam and vital signs are within normal limits.  I have reviewed the triage vital signs and the nursing notes. Pertinent labs & imaging results that were available during my care of the patient were reviewed by me and considered in my medical decision making (see chart for details). After  history, exam, and medical workup I feel the patient has been appropriately medically screened and is safe for discharge home. Pertinent diagnoses were discussed with the patient. Patient was given return precautions.  ED Discharge Orders     ED Discharge Orders          Ordered    potassium chloride  SA (KLOR-CON  M) 20 MEQ tablet  2 times daily        10/23/23 0142               Glendal Cassaday, MD 10/23/23 0148

## 2023-10-24 ENCOUNTER — Emergency Department (HOSPITAL_COMMUNITY)

## 2023-10-24 ENCOUNTER — Observation Stay (HOSPITAL_COMMUNITY)
Admission: EM | Admit: 2023-10-24 | Discharge: 2023-10-26 | Disposition: A | Discharge status: Home / Self Care | Attending: Family Medicine | Admitting: Family Medicine

## 2023-10-24 ENCOUNTER — Ambulatory Visit (HOSPITAL_COMMUNITY)
Admission: EM | Admit: 2023-10-24 | Discharge: 2023-10-24 | Disposition: A | Discharge status: Home / Self Care | Attending: Family Medicine | Admitting: Family Medicine

## 2023-10-24 ENCOUNTER — Other Ambulatory Visit: Payer: Self-pay

## 2023-10-24 ENCOUNTER — Encounter (HOSPITAL_COMMUNITY): Payer: Self-pay | Admitting: Emergency Medicine

## 2023-10-24 ENCOUNTER — Encounter (HOSPITAL_COMMUNITY): Payer: Self-pay | Admitting: *Deleted

## 2023-10-24 DIAGNOSIS — R531 Weakness: Secondary | ICD-10-CM

## 2023-10-24 DIAGNOSIS — N12 Tubulo-interstitial nephritis, not specified as acute or chronic: Principal | ICD-10-CM | POA: Diagnosis present

## 2023-10-24 DIAGNOSIS — Z7982 Long term (current) use of aspirin: Secondary | ICD-10-CM | POA: Diagnosis not present

## 2023-10-24 DIAGNOSIS — D649 Anemia, unspecified: Secondary | ICD-10-CM | POA: Insufficient documentation

## 2023-10-24 DIAGNOSIS — I10 Essential (primary) hypertension: Secondary | ICD-10-CM | POA: Diagnosis present

## 2023-10-24 DIAGNOSIS — R112 Nausea with vomiting, unspecified: Secondary | ICD-10-CM | POA: Diagnosis not present

## 2023-10-24 DIAGNOSIS — E876 Hypokalemia: Secondary | ICD-10-CM | POA: Diagnosis not present

## 2023-10-24 DIAGNOSIS — F32A Depression, unspecified: Secondary | ICD-10-CM | POA: Diagnosis not present

## 2023-10-24 DIAGNOSIS — R1030 Lower abdominal pain, unspecified: Secondary | ICD-10-CM | POA: Diagnosis not present

## 2023-10-24 DIAGNOSIS — Z1624 Resistance to multiple antibiotics: Secondary | ICD-10-CM | POA: Diagnosis not present

## 2023-10-24 DIAGNOSIS — N10 Acute tubulo-interstitial nephritis: Secondary | ICD-10-CM | POA: Diagnosis not present

## 2023-10-24 DIAGNOSIS — D72829 Elevated white blood cell count, unspecified: Secondary | ICD-10-CM | POA: Diagnosis not present

## 2023-10-24 DIAGNOSIS — Z8673 Personal history of transient ischemic attack (TIA), and cerebral infarction without residual deficits: Secondary | ICD-10-CM

## 2023-10-24 DIAGNOSIS — A498 Other bacterial infections of unspecified site: Secondary | ICD-10-CM | POA: Diagnosis not present

## 2023-10-24 DIAGNOSIS — Z91010 Allergy to peanuts: Secondary | ICD-10-CM | POA: Insufficient documentation

## 2023-10-24 DIAGNOSIS — I5043 Acute on chronic combined systolic (congestive) and diastolic (congestive) heart failure: Secondary | ICD-10-CM | POA: Insufficient documentation

## 2023-10-24 DIAGNOSIS — I110 Hypertensive heart disease with heart failure: Secondary | ICD-10-CM | POA: Diagnosis not present

## 2023-10-24 DIAGNOSIS — I700 Atherosclerosis of aorta: Secondary | ICD-10-CM | POA: Insufficient documentation

## 2023-10-24 DIAGNOSIS — I5022 Chronic systolic (congestive) heart failure: Secondary | ICD-10-CM | POA: Diagnosis present

## 2023-10-24 DIAGNOSIS — F17210 Nicotine dependence, cigarettes, uncomplicated: Secondary | ICD-10-CM | POA: Insufficient documentation

## 2023-10-24 DIAGNOSIS — Z79899 Other long term (current) drug therapy: Secondary | ICD-10-CM | POA: Insufficient documentation

## 2023-10-24 DIAGNOSIS — R059 Cough, unspecified: Secondary | ICD-10-CM | POA: Diagnosis not present

## 2023-10-24 DIAGNOSIS — F1090 Alcohol use, unspecified, uncomplicated: Secondary | ICD-10-CM | POA: Insufficient documentation

## 2023-10-24 LAB — CBC WITH DIFFERENTIAL/PLATELET
Basophils Absolute: 0 K/uL (ref 0.0–0.1)
Basophils Relative: 0 %
Eosinophils Absolute: 0.2 K/uL (ref 0.0–0.5)
Eosinophils Relative: 1 %
HCT: 35.5 % — ABNORMAL LOW (ref 39.0–52.0)
Hemoglobin: 11.3 g/dL — ABNORMAL LOW (ref 13.0–17.0)
Lymphocytes Relative: 14 %
Lymphs Abs: 3.4 K/uL (ref 0.7–4.0)
MCH: 29.9 pg (ref 26.0–34.0)
MCHC: 31.8 g/dL (ref 30.0–36.0)
MCV: 93.9 fL (ref 80.0–100.0)
Monocytes Absolute: 1.7 K/uL — ABNORMAL HIGH (ref 0.1–1.0)
Monocytes Relative: 7 %
Neutro Abs: 19 K/uL — ABNORMAL HIGH (ref 1.7–7.7)
Neutrophils Relative %: 78 %
Platelets: 460 K/uL — ABNORMAL HIGH (ref 150–400)
RBC: 3.78 MIL/uL — ABNORMAL LOW (ref 4.22–5.81)
RDW: 13.4 % (ref 11.5–15.5)
WBC: 24.3 K/uL — ABNORMAL HIGH (ref 4.0–10.5)
nRBC: 0 % (ref 0.0–0.2)

## 2023-10-24 LAB — URINALYSIS, W/ REFLEX TO CULTURE (INFECTION SUSPECTED)
Bacteria, UA: NONE SEEN
Bilirubin Urine: NEGATIVE
Glucose, UA: NEGATIVE mg/dL
Ketones, ur: NEGATIVE mg/dL
Nitrite: NEGATIVE
Protein, ur: NEGATIVE mg/dL
Specific Gravity, Urine: 1.014 (ref 1.005–1.030)
pH: 5 (ref 5.0–8.0)

## 2023-10-24 LAB — I-STAT CHEM 8, ED
BUN: 6 mg/dL — ABNORMAL LOW (ref 8–23)
Calcium, Ion: 1.1 mmol/L — ABNORMAL LOW (ref 1.15–1.40)
Chloride: 98 mmol/L (ref 98–111)
Creatinine, Ser: 0.9 mg/dL (ref 0.61–1.24)
Glucose, Bld: 125 mg/dL — ABNORMAL HIGH (ref 70–99)
HCT: 36 % — ABNORMAL LOW (ref 39.0–52.0)
Hemoglobin: 12.2 g/dL — ABNORMAL LOW (ref 13.0–17.0)
Potassium: 3.1 mmol/L — ABNORMAL LOW (ref 3.5–5.1)
Sodium: 139 mmol/L (ref 135–145)
TCO2: 28 mmol/L (ref 22–32)

## 2023-10-24 LAB — RESP PANEL BY RT-PCR (RSV, FLU A&B, COVID)  RVPGX2
Influenza A by PCR: NEGATIVE
Influenza B by PCR: NEGATIVE
Resp Syncytial Virus by PCR: NEGATIVE
SARS Coronavirus 2 by RT PCR: NEGATIVE

## 2023-10-24 LAB — POCT URINALYSIS DIP (MANUAL ENTRY)
Bilirubin, UA: NEGATIVE
Glucose, UA: NEGATIVE mg/dL
Ketones, POC UA: NEGATIVE mg/dL
Leukocytes, UA: NEGATIVE
Nitrite, UA: NEGATIVE
Protein Ur, POC: NEGATIVE mg/dL
Spec Grav, UA: 1.015 (ref 1.010–1.025)
Urobilinogen, UA: 0.2 U/dL
pH, UA: 5.5 (ref 5.0–8.0)

## 2023-10-24 LAB — LIPASE, BLOOD: Lipase: 24 U/L (ref 11–51)

## 2023-10-24 LAB — URINE CULTURE: Culture: NO GROWTH

## 2023-10-24 LAB — COMPREHENSIVE METABOLIC PANEL WITH GFR
ALT: 55 U/L — ABNORMAL HIGH (ref 0–44)
AST: 34 U/L (ref 15–41)
Albumin: 2.7 g/dL — ABNORMAL LOW (ref 3.5–5.0)
Alkaline Phosphatase: 137 U/L — ABNORMAL HIGH (ref 38–126)
Anion gap: 13 (ref 5–15)
BUN: 6 mg/dL — ABNORMAL LOW (ref 8–23)
CO2: 24 mmol/L (ref 22–32)
Calcium: 8.5 mg/dL — ABNORMAL LOW (ref 8.9–10.3)
Chloride: 99 mmol/L (ref 98–111)
Creatinine, Ser: 0.95 mg/dL (ref 0.61–1.24)
GFR, Estimated: >60 mL/min (ref >60)
Glucose, Bld: 126 mg/dL — ABNORMAL HIGH (ref 70–99)
Potassium: 3.2 mmol/L — ABNORMAL LOW (ref 3.5–5.1)
Sodium: 136 mmol/L (ref 135–145)
Total Bilirubin: 0.5 mg/dL (ref 0.0–1.2)
Total Protein: 6.8 g/dL (ref 6.5–8.1)

## 2023-10-24 LAB — I-STAT CG4 LACTIC ACID, ED: Lactic Acid, Venous: 1.7 mmol/L (ref 0.5–1.9)

## 2023-10-24 LAB — MAGNESIUM: Magnesium: 1.8 mg/dL (ref 1.7–2.4)

## 2023-10-24 MED ORDER — POTASSIUM CHLORIDE CRYS ER 20 MEQ PO TBCR
40.0000 meq | EXTENDED_RELEASE_TABLET | Freq: Once | ORAL | Status: AC
  Administered 2023-10-25: 40 meq via ORAL
  Filled 2023-10-24: qty 2

## 2023-10-24 MED ORDER — VANCOMYCIN HCL 1500 MG/300ML IV SOLN
1500.0000 mg | Freq: Once | INTRAVENOUS | Status: AC
  Administered 2023-10-25: 1500 mg via INTRAVENOUS
  Filled 2023-10-24: qty 300

## 2023-10-24 MED ORDER — SODIUM CHLORIDE 0.9 % IV BOLUS
1000.0000 mL | Freq: Once | INTRAVENOUS | Status: AC
  Administered 2023-10-24: 1000 mL via INTRAVENOUS

## 2023-10-24 MED ORDER — IOHEXOL 350 MG/ML SOLN
75.0000 mL | Freq: Once | INTRAVENOUS | Status: AC | PRN
  Administered 2023-10-24: 75 mL via INTRAVENOUS

## 2023-10-24 MED ORDER — SODIUM CHLORIDE 0.9 % IV SOLN
2.0000 g | Freq: Once | INTRAVENOUS | Status: DC
  Filled 2023-10-24: qty 20

## 2023-10-24 MED ORDER — LEVOFLOXACIN IN D5W 750 MG/150ML IV SOLN
750.0000 mg | Freq: Once | INTRAVENOUS | Status: AC
  Administered 2023-10-25: 750 mg via INTRAVENOUS
  Filled 2023-10-24: qty 150

## 2023-10-24 NOTE — ED Triage Notes (Signed)
 Pt was seen at [facility] today, complaints of weakness, was seen on 10/13 reported low potassium and high WBC, sent to ED for higher level of care.

## 2023-10-24 NOTE — Progress Notes (Signed)
 ED Pharmacy Antibiotic Sign Off An antibiotic consult was received from an ED provider for vancomycin per pharmacy dosing for sepsis. A chart review was completed to assess appropriateness.  The following one time order(s) were placed per pharmacy consult:  vancomycin 1500 mg x 1 dose  Further antibiotic and/or antibiotic pharmacy consults should be ordered by the admitting provider if indicated.   Thank you for allowing pharmacy to be a part of this patient's care.   Dorn Buttner, PharmD, BCPS 10/24/2023 11:04 PM ED Clinical Pharmacist -  [phone]

## 2023-10-24 NOTE — ED Provider Notes (Addendum)
 [place] EMERGENCY DEPARTMENT AT [facility] Provider Note  CSN: [id number] Arrival date & time: 10/24/23 1934  Chief Complaint(s) No chief complaint on file.  HPI Henry Schmitt. is a 63 y.o. male history of CHF, prior stroke, left bundle branch block, hypertension presenting to the emergency department with weakness.  Patient reports generalized weakness and fatigue, has been going on for the past week or so.  Was recently treated for UTI.  Was having dysuria that resolved.  Also having nausea and vomiting.  Was seen at outside ER 2 days ago, labs were notable for leukocytosis, testing is otherwise with mild hypokalemia which was repleted.  Urine testing was not concerning for UTI, urine culture was sent which had no growth.  Patient reports that he has had continuing fatigue and weakness.  Does also report some cough which is nonproductive.  Has not noticed any fevers or chills but having lots of malaise.  Was having some lower abdominal pain earlier as well as some diarrhea, diarrhea has resolved.  No headaches.  Initially saw urgent care today who advised patient should return to ER for further evaluation.   Past Medical History Past Medical History:  Diagnosis Date   Hypertension    Patient Active Problem List   Diagnosis Date Noted   Acute combined systolic and diastolic heart failure (HCC) 08/24/2023   Chronic systolic heart failure (HCC) 08/24/2023   Acute CVA (cerebrovascular accident) (HCC) 08/14/2023   LBBB (left bundle branch block) 08/14/2023   Smoker 02/12/2023   Influenza A 02/03/2016   Hypokalemia 02/03/2016   Bronchitis 02/03/2016   Anemia 02/03/2016   Aortitis 04/18/2015   Abdominal pain 04/18/2015   Essential hypertension, benign 04/18/2015   Leukocytosis 05/07/2014   Hypogonadotropic hypogonadism (HCC) 04/06/2014   Gynecomastia, male 03/18/2014   Home Medication(s) Prior to Admission medications   Medication Sig Start Date End Date Taking?  Authorizing Provider  aspirin  EC 81 MG tablet Take 1 tablet (81 mg total) by mouth daily. Swallow whole. 08/24/23   Milford, Harlene HERO, FNP  atorvastatin  (LIPITOR) 40 MG tablet Take 1 tablet (40 mg total) by mouth daily. 08/24/23   Glena Harlene HERO, FNP  carvedilol  (COREG ) 12.5 MG tablet Take 1 tablet (12.5 mg total) by mouth 2 (two) times daily with a meal. 08/24/23   Milford, Harlene HERO, FNP  dapagliflozin  propanediol (FARXIGA ) 10 MG TABS tablet Take 1 tablet (10 mg total) by mouth daily. 08/24/23   Milford, Harlene HERO, FNP  digoxin  (LANOXIN ) 0.125 MG tablet Take 1 tablet (0.125 mg total) by mouth daily. 09/11/23   Glena Harlene HERO, FNP  eplerenone  (INSPRA ) 25 MG tablet Take 1 tablet (25 mg total) by mouth daily. 08/24/23   Milford, Harlene HERO, FNP  furosemide  (LASIX ) 40 MG tablet Take 1 tablet (40 mg total) by mouth daily. 08/24/23 08/23/24  Glena Harlene HERO, FNP  ibuprofen  (ADVIL ) 200 MG tablet Take 200-600 mg by mouth every 6 (six) hours as needed.    [provider]  losartan  (COZAAR ) 25 MG tablet Take 1 tablet (25 mg total) by mouth daily. 08/24/23 08/23/24  Glena Harlene HERO, FNP  potassium chloride  SA (KLOR-CON  M) 20 MEQ tablet Take 1 tablet (20 mEq total) by mouth 2 (two) times daily. 10/23/23   Palumbo, April, MD  zolpidem (AMBIEN) 10 MG tablet Take 10 mg by mouth at bedtime as needed.    [provider]  albuterol  (PROVENTIL  HFA;VENTOLIN  HFA) 108 (90 Base) MCG/ACT inhaler Inhale 2 puffs into  the lungs every 4 (four) hours as needed for wheezing or shortness of breath. Patient not taking: Reported on 09/17/2018 12/27/17 10/31/19  Maranda Jamee Jacob, MD                                                                                                                                    Past Surgical History Past Surgical History:  Procedure Laterality Date   CHOLECYSTECTOMY     RIGHT/LEFT HEART CATH AND CORONARY ANGIOGRAPHY N/A 08/17/2023   Procedure: RIGHT/LEFT HEART CATH AND  CORONARY ANGIOGRAPHY;  Surgeon: Swaziland, Peter M, MD;  Location: [facility] INVASIVE CV LAB;  Service: Cardiovascular;  Laterality: N/A;   Family History Family History  Problem Relation Age of Onset   Heart failure Mother    Hypertension Mother    Diabetes Other     Social History Social History   Tobacco Use   Smoking status: Every Day    Current packs/day: 0.50    Types: Cigarettes   Smokeless tobacco: Never  Vaping Use   Vaping status: Never Used  Substance Use Topics   Alcohol use: Yes    Alcohol/week: 6.0 standard drinks of alcohol    Types: 6 Shots of liquor per week    Comment: 6 times per week   Drug use: Not Currently   Allergies Peanut-containing drug products; Antihistamines, chlorpheniramine-type; and Lisinopril  Review of Systems Review of Systems  All other systems reviewed and are negative.   Physical Exam Vital Signs  I have reviewed the triage vital signs BP (!) 143/68 (BP Location: Right Arm)   Pulse 85   Temp 97.8 F (36.6 C) (Oral)   Resp 18   Ht 5' 6 (1.676 m)   Wt 77.6 kg   SpO2 96%   BMI 27.61 kg/m  Physical Exam Vitals and nursing note reviewed.  Constitutional:      General: He is not in acute distress.    Appearance: Normal appearance.  HENT:     Mouth/Throat:     Mouth: Mucous membranes are moist.  Eyes:     General: No scleral icterus.    Conjunctiva/sclera: Conjunctivae normal.  Cardiovascular:     Rate and Rhythm: Normal rate and regular rhythm.  Pulmonary:     Effort: Pulmonary effort is normal. No respiratory distress.     Breath sounds: Normal breath sounds.  Abdominal:     General: Abdomen is flat.     Palpations: Abdomen is soft.     Tenderness: There is abdominal tenderness (mild lower abdomen no r/g). There is no right CVA tenderness or left CVA tenderness.  Musculoskeletal:     Cervical back: Neck supple.     Right lower leg: No edema.     Left lower leg: No edema.  Skin:    General: Skin is warm and dry.      Capillary Refill: Capillary refill takes less than 2 seconds.  Neurological:  Mental Status: He is alert and oriented to person, place, and time. Mental status is at baseline.  Psychiatric:        Mood and Affect: Mood normal.        Behavior: Behavior normal.     ED Results and Treatments Labs (all labs ordered are listed, but only abnormal results are displayed) Labs Reviewed  COMPREHENSIVE METABOLIC PANEL WITH GFR - Abnormal; Notable for the following components:      Result Value   Potassium 3.2 (*)    Glucose, Bld 126 (*)    BUN 6 (*)    Calcium  8.5 (*)    Albumin 2.7 (*)    ALT 55 (*)    Alkaline Phosphatase 137 (*)    All other components within normal limits  CBC WITH DIFFERENTIAL/PLATELET - Abnormal; Notable for the following components:   WBC 24.3 (*)    RBC 3.78 (*)    Hemoglobin 11.3 (*)    HCT 35.5 (*)    Platelets 460 (*)    Neutro Abs 19.0 (*)    Monocytes Absolute 1.7 (*)    All other components within normal limits  URINALYSIS, W/ REFLEX TO CULTURE (INFECTION SUSPECTED) - Abnormal; Notable for the following components:   APPearance HAZY (*)    Hgb urine dipstick MODERATE (*)    Leukocytes,Ua TRACE (*)    All other components within normal limits  I-STAT CHEM 8, ED - Abnormal; Notable for the following components:   Potassium 3.1 (*)    BUN 6 (*)    Glucose, Bld 125 (*)    Calcium , Ion 1.10 (*)    Hemoglobin 12.2 (*)    HCT 36.0 (*)    All other components within normal limits  RESP PANEL BY RT-PCR (RSV, FLU A&B, COVID)  RVPGX2  URINE CULTURE  CULTURE, BLOOD (ROUTINE X 2)  CULTURE, BLOOD (ROUTINE X 2)  LIPASE, BLOOD  MAGNESIUM   I-STAT CG4 LACTIC ACID, ED  I-STAT CG4 LACTIC ACID, ED                                                                                                                          Radiology CT ABDOMEN PELVIS W CONTRAST Addendum Date: 10/24/2023 ADDENDUM REPORT: 10/24/2023 22:36 ADDENDUM: Delayed images hand images on the  chest CT show areas of decreased perfusion within the upper pole and possibly lower pole of the left kidney compatible with pyelonephritis. IMPRESSION: Left pyelonephritis. Electronically Signed   By: Franky Crease M.D.   On: 10/24/2023 22:36   Result Date: 10/24/2023 CLINICAL DATA:  Abdominal pain EXAM: CT ABDOMEN AND PELVIS WITH CONTRAST TECHNIQUE: Multidetector CT imaging of the abdomen and pelvis was performed using the standard protocol following bolus administration of intravenous contrast. RADIATION DOSE REDUCTION: This exam was performed according to the departmental dose-optimization program which includes automated exposure control, adjustment of the mA and/or kV according to patient size and/or use of iterative reconstruction technique. CONTRAST:  75mL OMNIPAQUE  IOHEXOL   350 MG/ML SOLN COMPARISON:  10/22/2023 FINDINGS: Lower chest: No acute findings Hepatobiliary: No focal liver abnormality is seen. Status post cholecystectomy. No biliary dilatation. Pancreas: No focal abnormality or ductal dilatation. Spleen: No focal abnormality.  Normal size. Adrenals/Urinary Tract: Stable left renal cysts. No follow-up imaging recommended. No stones or hydronephrosis. Adrenal glands and urinary bladder unremarkable. Stomach/Bowel: Scattered colonic diverticulosis. No active diverticulitis. Stomach and small bowel decompressed. No bowel obstruction or inflammatory process. Vascular/Lymphatic: Aortoiliac atherosclerosis. No evidence of aneurysm or adenopathy. Reproductive: No visible focal abnormality. Other: No free fluid or free air. Musculoskeletal: No acute bony abnormality. IMPRESSION: 1. No acute findings in the abdomen or pelvis. 2. Aortic atherosclerosis. 3. Colonic diverticulosis. Electronically Signed: By: Franky Crease M.D. On: 10/24/2023 22:22   CT Chest Wo Contrast Result Date: 10/24/2023 EXAM: CT CHEST WITHOUT CONTRAST 10/24/2023 10:19:04 PM TECHNIQUE: CT of the chest was performed without the  administration of intravenous contrast. Multiplanar reformatted images are provided for review. Automated exposure control, iterative reconstruction, and/or weight based adjustment of the mA/kV was utilized to reduce the radiation dose to as low as reasonably achievable. COMPARISON: Chest radiograph dated 10/24/2023. CLINICAL HISTORY: Respiratory illness, nondiagnostic xray. Dr called to add chest after abd scan while pt was still on table- may be residual contrast. FINDINGS: MEDIASTINUM: Heart and pericardium are unremarkable. Moderate 3-vessel coronary atherosclerosis. Thoracic aortic atherosclerosis. The central airways are clear. LYMPH NODES: No mediastinal, hilar or axillary lymphadenopathy. LUNGS AND PLEURA: No focal consolidation or pulmonary edema. No pleural effusion or pneumothorax. SOFT TISSUES/BONES: No acute abnormality of the bones or soft tissues. UPPER ABDOMEN: Limited images of the upper abdomen demonstrate cholecystectomy. Heterogeneous enhancement of the left kidney, suggesting pyelonephritis. IMPRESSION: 1. No acute findings in the chest. 2. Heterogeneous enhancement of the left kidney, suggesting pyelonephritis. Electronically signed by: Pinkie Pebbles MD 10/24/2023 10:33 PM EDT RP Workstation: [id number]   DG Chest Portable 1 View Result Date: 10/24/2023 EXAM: 1 VIEW(S) XRAY OF THE CHEST 10/24/2023 09:19:16 PM COMPARISON: cxr 08/14/23 cta head and neck 08/14/23 CLINICAL HISTORY: shob. Per chart - Pt been sick for 9 days. Reports having vomiting and diarrhea. Was seen at PCP Monday and blood work was abnormal so sent to ED for further work up. Reports WBC high and potassium low. Reports no one can tell why WBC count going up. Ct scan ; in ED the other night didn't show anything abnormal. FINDINGS: LUNGS AND PLEURA: Chronic coarsened interstitial markings. No focal pulmonary opacity. No overt pulmonary edema. No pleural effusion. No pneumothorax. HEART AND MEDIASTINUM: Atherosclerotic plaque.  Unchanged  cardiac and mediastinal silhouettes. BONES AND SOFT TISSUES: No acute osseous abnormality. IMPRESSION: 1. No acute cardiopulmonary process. 2. Emphysema and atherosclerotic plaque. Electronically signed by: Morgane Naveau MD 10/24/2023 09:27 PM EDT RP Workstation: [id number]    Pertinent labs & imaging results that were available during my care of the patient were reviewed by me and considered in my medical decision making (see MDM for details).  Medications Ordered in ED Medications  vancomycin (VANCOREADY) IVPB 1500 mg/300 mL (has no administration in time range)  levofloxacin  (LEVAQUIN ) IVPB 750 mg (has no administration in time range)  potassium chloride  SA (KLOR-CON  M) CR tablet 40 mEq (has no administration in time range)  sodium chloride  0.9 % bolus 1,000 mL (1,000 mLs Intravenous New Bag/Given 10/24/23 2141)  iohexol  (OMNIPAQUE ) 350 MG/ML injection 75 mL (75 mLs Intravenous Contrast Given 10/24/23 2217)  Procedures Procedures  (including critical care time)  Medical Decision Making / ED Course   MDM:  63 year old presenting to the emergency department with weakness, fatigue.    Patient no acute distress.  Examination is overall reassuring.  Given persistent symptoms, suspect some type of occult process.  Obtained laboratory testing notable for leukocytosis which is worsening.  Labs also with mild hypokalemia.  UA with some leukocytes but no bacteria.  Complicated this patient has recently been on oral antibiotics for UTI.  Chest x-ray is obtained without evidence of pneumonia.  CT abdomen was obtained given lower abdominal pain and tenderness, without evidence of underlying cause.  Given cough and normal chest x-ray CT chest was obtained, was obtained after CT abdomen and delayed contrast phase did show evidence of pyelonephritis which per  radiology was more evident on repeat evaluation of the initial CT abdomen.  No CVA tenderness but given recent UTI treatment this would be a potential cause.  Reviewed outside culture report which shows Enterobacter Cloacae complex. Component 9 d ago Comments  Urine Culture Final report Abnormal    URINE CULTURE RESULT Comment Abnormal  Enterobacter cloacae complex Multi-Drug Resistant Organism Greater than 100,000 colony forming units per mL  Antimicrobial Susceptibility Comment       ** S = Susceptible; I = Intermediate; R = Resistant **                    P = Positive; N = Negative             MICS are expressed in micrograms per mL    Antibiotic                 RSLT#1    RSLT#2    RSLT#3    RSLT#4 Amoxicillin /Clavulanic Acid    R Cefepime                       I Cefoxitin                      R Cefpodoxime                    R Ertapenem                      I Gentamicin                     S Levofloxacin                    S Nitrofurantoin                 R Tetracycline                   S Tobramycin                     S Trimethoprim/Sulfa             S    Does seem sensitive to levofloxacin  so we will start with this.  Intermediate to resistant for most other common antibiotics including ertapenem.  May need ID consult.  Will consult hospitalist for admission given persistent symptoms  Clinical Course as of 10/24/23 2331  Wed Oct 24, 2023  2327 Signed out to Dr. Franky for admit [WS]    Clinical Course User Index [WS] Francesca Elsie CROME, MD     Additional history obtained: -  Additional history obtained from family -External records from outside source obtained and reviewed including: Chart review including previous notes, labs, imaging, consultation notes including prior ER , UC visit. Outside culture data   Lab Tests: -I ordered, reviewed, and interpreted labs.   The pertinent results include:   Labs Reviewed  COMPREHENSIVE METABOLIC PANEL WITH GFR - Abnormal;  Notable for the following components:      Result Value   Potassium 3.2 (*)    Glucose, Bld 126 (*)    BUN 6 (*)    Calcium  8.5 (*)    Albumin 2.7 (*)    ALT 55 (*)    Alkaline Phosphatase 137 (*)    All other components within normal limits  CBC WITH DIFFERENTIAL/PLATELET - Abnormal; Notable for the following components:   WBC 24.3 (*)    RBC 3.78 (*)    Hemoglobin 11.3 (*)    HCT 35.5 (*)    Platelets 460 (*)    Neutro Abs 19.0 (*)    Monocytes Absolute 1.7 (*)    All other components within normal limits  URINALYSIS, W/ REFLEX TO CULTURE (INFECTION SUSPECTED) - Abnormal; Notable for the following components:   APPearance HAZY (*)    Hgb urine dipstick MODERATE (*)    Leukocytes,Ua TRACE (*)    All other components within normal limits  I-STAT CHEM 8, ED - Abnormal; Notable for the following components:   Potassium 3.1 (*)    BUN 6 (*)    Glucose, Bld 125 (*)    Calcium , Ion 1.10 (*)    Hemoglobin 12.2 (*)    HCT 36.0 (*)    All other components within normal limits  RESP PANEL BY RT-PCR (RSV, FLU A&B, COVID)  RVPGX2  URINE CULTURE  CULTURE, BLOOD (ROUTINE X 2)  CULTURE, BLOOD (ROUTINE X 2)  LIPASE, BLOOD  MAGNESIUM   I-STAT CG4 LACTIC ACID, ED  I-STAT CG4 LACTIC ACID, ED    Notable for mild hypokalemia, leukocytosis   EKG   EKG Interpretation Date/Time:  Wednesday October 24 2023 22:31:37 EDT Ventricular Rate:  80 PR Interval:  180 QRS Duration:  106 QT Interval:  382 QTC Calculation: 441 R Axis:   83  Text Interpretation: Sinus rhythm Left atrial enlargement Borderline right axis deviation Confirmed by Francesca Fallow ([id number]) on 10/24/2023 11:28:25 PM         Imaging Studies ordered: I ordered imaging studies including CT chest, CT abdomen On my interpretation imaging demonstrates e/o pyelonephritis  I independently visualized and interpreted imaging. I agree with the radiologist interpretation   Medicines ordered and prescription drug  management: Meds ordered this encounter  Medications   sodium chloride  0.9 % bolus 1,000 mL   iohexol  (OMNIPAQUE ) 350 MG/ML injection 75 mL   DISCONTD: cefTRIAXone (ROCEPHIN) 2 g in sodium chloride  0.9 % 100 mL IVPB    Antibiotic Indication::   UTI   vancomycin (VANCOREADY) IVPB 1500 mg/300 mL    Indication::   Sepsis   levofloxacin  (LEVAQUIN ) IVPB 750 mg    Antibiotic Indication::   UTI   potassium chloride  SA (KLOR-CON  M) CR tablet 40 mEq    -I have reviewed the patients home medicines and have made adjustments as needed   Consultations Obtained: I requested consultation with the hospitalist,  and discussed lab and imaging findings as well as pertinent plan - they recommend: admission  Reevaluation: After the interventions noted above, I reevaluated the patient and found that their symptoms have improved  Co morbidities  that complicate the patient evaluation  Past Medical History:  Diagnosis Date   Hypertension       Dispostion: Disposition decision including need for hospitalization was considered, and patient admitted to the hospital.    Final Clinical Impression(s) / ED Diagnoses Final diagnoses:  Pyelonephritis  Infection due to multidrug-resistant Enterobacter cloacae     This chart was dictated using voice recognition software.  Despite best efforts to proofread,  errors can occur which can change the documentation meaning.    Francesca Elsie CROME, MD 10/24/23 2328    Francesca Elsie CROME, MD 10/24/23 [phone]

## 2023-10-24 NOTE — ED Provider Notes (Signed)
 [facility] CARE CENTER   [id number] 10/24/23 Arrival Time: 1835  ASSESSMENT & PLAN:  1. Feeling weak   2. Leukocytosis, unspecified type   3. Hypokalemia   4. Lower abdominal pain    Labs reviewed from 10/22/23. Reports that he is feeling worse. Unclear etiology of his current symptoms. Abd CT unremarkable. Looks unwell. Feel he needs repeat labs this evening. I am not able to get these in a timely manner this evening. Feel ED evaluation would be best for him.  BP 115/70 (BP Location: Left Arm)   Pulse 88   Temp 97.9 F (36.6 C) (Oral)   Resp 18   SpO2 97%   To ED via POV; stable upon discharge.   Follow-up Information     Go to  [facility] Emergency Department at [facility].   Specialty: Emergency Medicine Contact information: [address] [place]  [address] [phone]                Reviewed expectations re: course of current medical issues. Questions answered. Outlined signs and symptoms indicating need for more acute intervention. Patient verbalized understanding. After Visit Summary given.   SUBJECTIVE: History from: patient. Henry Schmitt. is a 63 y.o. male who presents with complaint of not feeling well for the past 1.5 weeks. Seen in ED on 10/22/23; note reviewed. With leukocytosis and hypokalemia. Unremarkable abd CT. Still having lower abdominal discomfort; worse when supine; affecting sleep. Bowel movements have been loose and non-bloody. Normal urination. Decreased appetite. Has not been able to see PCP since ED visit.  Past Surgical History:  Procedure Laterality Date   CHOLECYSTECTOMY     RIGHT/LEFT HEART CATH AND CORONARY ANGIOGRAPHY N/A 08/17/2023   Procedure: RIGHT/LEFT HEART CATH AND CORONARY ANGIOGRAPHY;  Surgeon: Swaziland, Peter M, MD;  Location: [facility] INVASIVE CV LAB;  Service: Cardiovascular;  Laterality: N/A;    OBJECTIVE:  Vitals:   10/24/23 1858  BP: 115/70  Pulse: 88  Resp: 18  Temp: 97.9  F (36.6 C)  TempSrc: Oral  SpO2: 97%    General appearance: alert, oriented, no acute distress but appears unwell HEENT: [place]; AT; oropharynx moist Lungs: unlabored respirations Abdomen: soft; without distention; mild  and poorly localized tenderness to palpation over lower abdomen; hypoactive bowel sounds; without masses or organomegaly; without guarding or rebound tenderness Back: without CVA tenderness; FROM at waist Extremities: without LE edema; symmetrical; without gross deformities Skin: warm and dry Neurologic: normal gait Psychological: alert and cooperative; normal mood and affect  Labs: Results for orders placed or performed during the hospital encounter of 10/24/23  POC urinalysis dipstick   Collection Time: 10/24/23  7:07 PM  Result Value Ref Range   Color, UA yellow yellow   Clarity, UA clear clear   Glucose, UA negative negative mg/dL   Bilirubin, UA negative negative   Ketones, POC UA negative negative mg/dL   Spec Grav, UA 8.984 8.989 - 1.025   Blood, UA small (A) negative   pH, UA 5.5 5.0 - 8.0   Protein Ur, POC negative negative mg/dL   Urobilinogen, UA 0.2 0.2 or 1.0 E.U./dL   Nitrite, UA Negative Negative   Leukocytes, UA Negative Negative   Labs Reviewed  POCT URINALYSIS DIP (MANUAL ENTRY) - Abnormal; Notable for the following components:      Result Value   Blood, UA small (*)    All other components within normal limits    Imaging: No results found.   Allergies  Allergen  Reactions   Peanut-Containing Drug Products Swelling    ALL TREE NUTS   Antihistamines, Chlorpheniramine-Type Hives and Swelling   Lisinopril Swelling                                               Past Medical History:  Diagnosis Date   Hypertension     Social History   Socioeconomic History   Marital status: Single    Spouse name: Not on file   Number of children: Not on file   Years of education: Not on file   Highest education level: Not on file  Occupational  History   Not on file  Tobacco Use   Smoking status: Every Day    Current packs/day: 0.50    Types: Cigarettes   Smokeless tobacco: Never  Vaping Use   Vaping status: Never Used  Substance and Sexual Activity   Alcohol use: Yes    Alcohol/week: 6.0 standard drinks of alcohol    Types: 6 Shots of liquor per week    Comment: 6 times per week   Drug use: Not Currently   Sexual activity: Never  Other Topics Concern   Not on file  Social History Narrative   Not on file   Social Drivers of Health   Financial Resource Strain: Low Risk  (02/12/2023)   Received from [facility]   Overall Financial Resource Strain (CARDIA)    Difficulty of Paying Living Expenses: Not very hard  Food Insecurity: No Food Insecurity (08/15/2023)   Hunger Vital Sign    Worried About Running Out of Food in the Last Year: Never true    Ran Out of Food in the Last Year: Never true  Transportation Needs: No Transportation Needs (08/15/2023)   PRAPARE - Administrator, Civil Service (Medical): No    Lack of Transportation (Non-Medical): No  Physical Activity: Unknown (02/12/2023)   Received from [facility]   Exercise Vital Sign    On average, how many days per week do you engage in moderate to strenuous exercise (like a brisk walk)?: 0 days    Minutes of Exercise per Session: Not on file  Stress: No Stress Concern Present (02/12/2023)   Received from [facility] - Occupational Stress Questionnaire    Feeling of Stress : Only a little  Social Connections: Moderately Integrated (02/12/2023)   Received from [facility]   Social Network    How would you rate your social network (family, work, friends)?: Adequate participation with social networks  Intimate Partner Violence: Not At Risk (08/15/2023)   Humiliation, Afraid, Rape, and Kick questionnaire    Fear of Current or Ex-Partner: No    Emotionally Abused: No    Physically Abused: No    Sexually Abused:  No    Family History  Problem Relation Age of Onset   Heart failure Mother    Hypertension Mother    Diabetes Other      Rolinda Rogue, MD 10/24/23 [phone]

## 2023-10-24 NOTE — ED Notes (Signed)
 Patient is being discharged from the Urgent Care and sent to the Emergency Department via private vehicle . Per Dr. Rolinda, patient is in need of higher level of care due to weakness and abnormal labs. Patient is aware and verbalizes understanding of plan of care.  Vitals:   10/24/23 1858  BP: 115/70  Pulse: 88  Resp: 18  Temp: 97.9 F (36.6 C)  SpO2: 97%

## 2023-10-24 NOTE — ED Triage Notes (Signed)
 Pt been sick for 9 days. Reports having vomiting and diarrhea. Was seen at PCP Monday and blood work was abnormal so sent to ED for further work up. Reports WBC high and potassium low. Reports no one can tell why WBC count going up. Ct scan in ED the other night didn't show anything abnormal.  C/o abd pain only when lays down

## 2023-10-25 ENCOUNTER — Ambulatory Visit

## 2023-10-25 ENCOUNTER — Encounter (HOSPITAL_COMMUNITY): Payer: Self-pay | Admitting: Internal Medicine

## 2023-10-25 DIAGNOSIS — Z8673 Personal history of transient ischemic attack (TIA), and cerebral infarction without residual deficits: Secondary | ICD-10-CM

## 2023-10-25 DIAGNOSIS — I10 Essential (primary) hypertension: Secondary | ICD-10-CM | POA: Diagnosis not present

## 2023-10-25 DIAGNOSIS — D72829 Elevated white blood cell count, unspecified: Secondary | ICD-10-CM | POA: Diagnosis not present

## 2023-10-25 DIAGNOSIS — N12 Tubulo-interstitial nephritis, not specified as acute or chronic: Principal | ICD-10-CM

## 2023-10-25 DIAGNOSIS — E876 Hypokalemia: Secondary | ICD-10-CM | POA: Diagnosis not present

## 2023-10-25 LAB — URINE CULTURE: Culture: NO GROWTH

## 2023-10-25 LAB — BASIC METABOLIC PANEL WITH GFR
Anion gap: 10 (ref 5–15)
BUN: 6 mg/dL — ABNORMAL LOW (ref 8–23)
CO2: 24 mmol/L (ref 22–32)
Calcium: 8 mg/dL — ABNORMAL LOW (ref 8.9–10.3)
Chloride: 103 mmol/L (ref 98–111)
Creatinine, Ser: 0.87 mg/dL (ref 0.61–1.24)
GFR, Estimated: >60 mL/min (ref >60)
Glucose, Bld: 130 mg/dL — ABNORMAL HIGH (ref 70–99)
Potassium: 3.4 mmol/L — ABNORMAL LOW (ref 3.5–5.1)
Sodium: 137 mmol/L (ref 135–145)

## 2023-10-25 LAB — CBC
HCT: 31 % — ABNORMAL LOW (ref 39.0–52.0)
Hemoglobin: 9.9 g/dL — ABNORMAL LOW (ref 13.0–17.0)
MCH: 30.1 pg (ref 26.0–34.0)
MCHC: 31.9 g/dL (ref 30.0–36.0)
MCV: 94.2 fL (ref 80.0–100.0)
Platelets: 407 K/uL — ABNORMAL HIGH (ref 150–400)
RBC: 3.29 MIL/uL — ABNORMAL LOW (ref 4.22–5.81)
RDW: 13.4 % (ref 11.5–15.5)
WBC: 21.9 K/uL — ABNORMAL HIGH (ref 4.0–10.5)
nRBC: 0 % (ref 0.0–0.2)

## 2023-10-25 LAB — DIGOXIN LEVEL: Digoxin Level: 0.8 ng/mL (ref 0.8–2.0)

## 2023-10-25 MED ORDER — LORAZEPAM 2 MG/ML IJ SOLN: 1.0000 mg | INTRAMUSCULAR | Status: DC | PRN

## 2023-10-25 MED ORDER — ADULT MULTIVITAMIN W/MINERALS CH
1.0000 | ORAL_TABLET | Freq: Every day | ORAL | Status: DC
  Administered 2023-10-25 – 2023-10-26 (×2): 1 via ORAL
  Filled 2023-10-25 (×2): qty 1

## 2023-10-25 MED ORDER — POTASSIUM CHLORIDE CRYS ER 20 MEQ PO TBCR
20.0000 meq | EXTENDED_RELEASE_TABLET | Freq: Two times a day (BID) | ORAL | Status: AC
Start: 2023-10-25 — End: ?
  Administered 2023-10-25 – 2023-10-26 (×2): 20 meq via ORAL
  Filled 2023-10-25 (×2): qty 1

## 2023-10-25 MED ORDER — ACETAMINOPHEN 325 MG PO TABS
650.0000 mg | ORAL_TABLET | Freq: Four times a day (QID) | ORAL | Status: DC | PRN
  Administered 2023-10-25 (×2): 650 mg via ORAL
  Filled 2023-10-25 (×2): qty 2

## 2023-10-25 MED ORDER — CARVEDILOL 12.5 MG PO TABS
12.5000 mg | ORAL_TABLET | Freq: Two times a day (BID) | ORAL | Status: DC
  Administered 2023-10-25 – 2023-10-26 (×3): 12.5 mg via ORAL
  Filled 2023-10-25 (×3): qty 1

## 2023-10-25 MED ORDER — ATORVASTATIN CALCIUM 40 MG PO TABS
40.0000 mg | ORAL_TABLET | Freq: Every day | ORAL | Status: DC
  Administered 2023-10-25 – 2023-10-26 (×2): 40 mg via ORAL
  Filled 2023-10-25 (×2): qty 1

## 2023-10-25 MED ORDER — ENOXAPARIN SODIUM 40 MG/0.4ML IJ SOSY
40.0000 mg | PREFILLED_SYRINGE | Freq: Every day | INTRAMUSCULAR | Status: DC
  Administered 2023-10-25 – 2023-10-26 (×2): 40 mg via SUBCUTANEOUS
  Filled 2023-10-25 (×2): qty 0.4

## 2023-10-25 MED ORDER — LEVOFLOXACIN IN D5W 750 MG/150ML IV SOLN
750.0000 mg | INTRAVENOUS | Status: DC
  Administered 2023-10-25: 750 mg via INTRAVENOUS
  Filled 2023-10-25: qty 150

## 2023-10-25 MED ORDER — LORAZEPAM 1 MG PO TABS: 1.0000 mg | ORAL_TABLET | ORAL | Status: DC | PRN

## 2023-10-25 MED ORDER — DAPAGLIFLOZIN PROPANEDIOL 10 MG PO TABS: 10.0000 mg | ORAL_TABLET | Freq: Every day | ORAL | Status: DC

## 2023-10-25 MED ORDER — FOLIC ACID 1 MG PO TABS
1.0000 mg | ORAL_TABLET | Freq: Every day | ORAL | Status: DC
  Administered 2023-10-25 – 2023-10-26 (×2): 1 mg via ORAL
  Filled 2023-10-25 (×2): qty 1

## 2023-10-25 MED ORDER — LOSARTAN POTASSIUM 25 MG PO TABS
25.0000 mg | ORAL_TABLET | Freq: Every day | ORAL | Status: DC
  Administered 2023-10-25 – 2023-10-26 (×2): 25 mg via ORAL
  Filled 2023-10-25 (×2): qty 1

## 2023-10-25 MED ORDER — ENSURE PLUS HIGH PROTEIN PO LIQD
237.0000 mL | Freq: Two times a day (BID) | ORAL | Status: DC
  Administered 2023-10-26: 237 mL via ORAL

## 2023-10-25 MED ORDER — THIAMINE MONONITRATE 100 MG PO TABS
100.0000 mg | ORAL_TABLET | Freq: Every day | ORAL | Status: DC
  Administered 2023-10-25 – 2023-10-26 (×2): 100 mg via ORAL
  Filled 2023-10-25 (×2): qty 1

## 2023-10-25 MED ORDER — EPLERENONE 25 MG PO TABS
25.0000 mg | ORAL_TABLET | Freq: Every day | ORAL | Status: DC
  Administered 2023-10-25 – 2023-10-26 (×2): 25 mg via ORAL
  Filled 2023-10-25 (×2): qty 1

## 2023-10-25 MED ORDER — THIAMINE HCL 100 MG/ML IJ SOLN
100.0000 mg | Freq: Every day | INTRAMUSCULAR | Status: DC
  Filled 2023-10-25 (×2): qty 2

## 2023-10-25 MED ORDER — DIGOXIN 125 MCG PO TABS
0.1250 mg | ORAL_TABLET | Freq: Every day | ORAL | Status: AC
Start: 2023-10-25 — End: ?
  Administered 2023-10-25 – 2023-10-26 (×2): 0.125 mg via ORAL
  Filled 2023-10-25 (×2): qty 1

## 2023-10-25 MED ORDER — FUROSEMIDE 40 MG PO TABS
40.0000 mg | ORAL_TABLET | Freq: Every day | ORAL | Status: DC
  Administered 2023-10-25 – 2023-10-26 (×2): 40 mg via ORAL
  Filled 2023-10-25: qty 1
  Filled 2023-10-25: qty 2

## 2023-10-25 MED ORDER — ASPIRIN 81 MG PO TBEC
81.0000 mg | DELAYED_RELEASE_TABLET | Freq: Every day | ORAL | Status: DC
  Administered 2023-10-25 – 2023-10-26 (×2): 81 mg via ORAL
  Filled 2023-10-25 (×2): qty 1

## 2023-10-25 NOTE — H&P (Signed)
 History and Physical    Henry Schmitt Henry Schmitt. FMW:[id number] DOB: Oct 05, 1960 DOA: 10/24/2023  Patient coming from: Home.  Chief Complaint: Generalized weakness.  HPI: Henry Schmitt. is a 63 y.o. male with history of recent admission for acute pontine stroke in August 2025 at that time patient was diagnosed with new HFrEF has been having some dysuria about 10 days ago with abdominal discomfort and nausea vomiting was prescribed ciprofloxacin by patient's primary care physician at that time lab also was found to have leukocytosis of 21,000.  Patient has had prior leukocytosis for around 15-17,000 in August 2025.  Had recently gone to the ER two days ago for nausea vomiting was observed in the ER.  Patient was replaced.  Patient comes to the ER because of increasing weakness and abdominal discomfort.    Patient had recent urine cultures done on October 15, 2022 I which grew Enterobacter cloacae she was multidrug-resistant but was sensitive to Levaquin .  ED Course: In the ER patient is afebrile.  Labs show WBC of 24.3 hemoglobin 11.3 creatinine 0.9 albumin 2.7 ALT 55.  Potassium 3.2 for which patient was given replacement.  UA shows WBC of 21-50 trace leukocyte esterase.  CT chest abdomen pelvis was done which shows features concerning for left-sided pyelonephritis.  Cultures were drawn and started on Levaquin .  At the time of my exam patient is able to tolerate diet.  Review of Systems: As per HPI, rest all negative.   Past Medical History:  Diagnosis Date   Hypertension     Past Surgical History:  Procedure Laterality Date   CHOLECYSTECTOMY     RIGHT/LEFT HEART CATH AND CORONARY ANGIOGRAPHY N/A 08/17/2023   Procedure: RIGHT/LEFT HEART CATH AND CORONARY ANGIOGRAPHY;  Surgeon: Swaziland, Peter M, MD;  Location: [facility] INVASIVE CV LAB;  Service: Cardiovascular;  Laterality: N/A;     reports that he has been smoking cigarettes. He has never used smokeless tobacco. He reports current alcohol use  of about 6.0 standard drinks of alcohol per week. He reports that he does not currently use drugs.  Allergies  Allergen Reactions   Peanut-Containing Drug Products Swelling    ALL TREE NUTS   Antihistamines, Chlorpheniramine-Type Hives and Swelling   Lisinopril Swelling    Family History  Problem Relation Age of Onset   Heart failure Mother    Hypertension Mother    Diabetes Other     Prior to Admission medications   Medication Sig Start Date End Date Taking? Authorizing Provider  aspirin  EC 81 MG tablet Take 1 tablet (81 mg total) by mouth daily. Swallow whole. 08/24/23   Milford, Harlene HERO, FNP  atorvastatin  (LIPITOR) 40 MG tablet Take 1 tablet (40 mg total) by mouth daily. 08/24/23   Glena Harlene HERO, FNP  carvedilol  (COREG ) 12.5 MG tablet Take 1 tablet (12.5 mg total) by mouth 2 (two) times daily with a meal. 08/24/23   Milford, Harlene HERO, FNP  dapagliflozin  propanediol (FARXIGA ) 10 MG TABS tablet Take 1 tablet (10 mg total) by mouth daily. 08/24/23   Milford, Harlene HERO, FNP  digoxin  (LANOXIN ) 0.125 MG tablet Take 1 tablet (0.125 mg total) by mouth daily. 09/11/23   Milford, Harlene HERO, FNP  eplerenone  (INSPRA ) 25 MG tablet Take 1 tablet (25 mg total) by mouth daily. 08/24/23   Milford, Harlene HERO, FNP  furosemide  (LASIX ) 40 MG tablet Take 1 tablet (40 mg total) by mouth daily. 08/24/23 08/23/24  Glena Harlene HERO, FNP  ibuprofen  (ADVIL ) 200 MG tablet Take  200-600 mg by mouth every 6 (six) hours as needed.    [provider]  losartan  (COZAAR ) 25 MG tablet Take 1 tablet (25 mg total) by mouth daily. 08/24/23 08/23/24  Glena Harlene HERO, FNP  potassium chloride  SA (KLOR-CON  M) 20 MEQ tablet Take 1 tablet (20 mEq total) by mouth 2 (two) times daily. 10/23/23   Palumbo, April, MD  zolpidem (AMBIEN) 10 MG tablet Take 10 mg by mouth at bedtime as needed.    [provider]  albuterol  (PROVENTIL  HFA;VENTOLIN  HFA) 108 (90 Base) MCG/ACT inhaler Inhale 2 puffs into the lungs every 4  (four) hours as needed for wheezing or shortness of breath. Patient not taking: Reported on 09/17/2018 12/27/17 10/31/19  Maranda Jamee Jacob, MD    Physical Exam: Constitutional: Moderately built and nourished. Vitals:   10/24/23 2236 10/25/23 0015 10/25/23 0035 10/25/23 0043  BP: (!) 143/68 117/78  (!) 144/61  Pulse: 85 72    Resp: 18 (!) 21  18  Temp: 97.8 F (36.6 C)  97.7 F (36.5 C) 97.7 F (36.5 C)  TempSrc: Oral  Oral Oral  SpO2: 96% 100%  100%  Weight:      Height:       Eyes: Anicteric no pallor. ENMT: No discharge from the ears eyes nose or mouth. Neck: No mass felt.  No neck rigidity. Respiratory: No rhonchi or crepitations. Cardiovascular: S1-S2 heard. Abdomen: Soft nontender bowel sound present. Musculoskeletal: No edema. Skin: No rash. Neurologic: Alert awake oriented to time place and person.  Moves all extremities. Psychiatric: Appears normal.  Normal affect.   Labs on Admission: I have personally reviewed following labs and imaging studies  CBC: Recent Labs  Lab 10/22/23 1915 10/24/23 2134 10/24/23 2150  WBC 21.0* 24.3*  --   NEUTROABS 15.0* 19.0*  --   HGB 11.6* 11.3* 12.2*  HCT 34.7* 35.5* 36.0*  MCV 90.4 93.9  --   PLT 402* 460*  --    Basic Metabolic Panel: Recent Labs  Lab 10/22/23 1915 10/24/23 2134 10/24/23 2150  NA 136 136 139  K 2.8* 3.2* 3.1*  CL 96* 99 98  CO2 26 24  --   GLUCOSE 190* 126* 125*  BUN 12 6* 6*  CREATININE 1.12 0.95 0.90  CALCIUM  9.8 8.5*  --   MG  --  1.8  --    GFR: Estimated Creatinine Clearance: 82.3 mL/min (by C-G formula based on SCr of 0.9 mg/dL). Liver Function Tests: Recent Labs  Lab 10/22/23 1915 10/24/23 2134  AST 37 34  ALT 45* 55*  ALKPHOS 149* 137*  BILITOT 0.5 0.5  PROT 7.7 6.8  ALBUMIN 3.7 2.7*   Recent Labs  Lab 10/24/23 2134  LIPASE 24   No results for input(s): AMMONIA in the last 168 hours. Coagulation Profile: No results for input(s): INR, PROTIME in the last 168  hours. Cardiac Enzymes: No results for input(s): CKTOTAL, CKMB, CKMBINDEX, TROPONINI in the last 168 hours. BNP (last 3 results) No results for input(s): PROBNP in the last 8760 hours. HbA1C: No results for input(s): HGBA1C in the last 72 hours. CBG: No results for input(s): GLUCAP in the last 168 hours. Lipid Profile: No results for input(s): CHOL, HDL, LDLCALC, TRIG, CHOLHDL, LDLDIRECT in the last 72 hours. Thyroid Function Tests: No results for input(s): TSH, T4TOTAL, FREET4, T3FREE, THYROIDAB in the last 72 hours. Anemia Panel: No results for input(s): VITAMINB12, FOLATE, FERRITIN, TIBC, IRON, RETICCTPCT in the last 72 hours. Urine analysis:  Component Value Date/Time   COLORURINE YELLOW 10/24/2023 2145   APPEARANCEUR HAZY (A) 10/24/2023 2145   LABSPEC 1.014 10/24/2023 2145   PHURINE 5.0 10/24/2023 2145   GLUCOSEU NEGATIVE 10/24/2023 2145   HGBUR MODERATE (A) 10/24/2023 2145   BILIRUBINUR NEGATIVE 10/24/2023 2145   BILIRUBINUR negative 10/24/2023 1907   KETONESUR NEGATIVE 10/24/2023 2145   KETONESUR negative 10/24/2023 1907   PROTEINUR NEGATIVE 10/24/2023 2145   PROTEINUR negative 10/24/2023 1907   UROBILINOGEN 0.2 10/24/2023 1907   UROBILINOGEN 0.2 02/10/2008 0229   NITRITE NEGATIVE 10/24/2023 2145   NITRITE Negative 10/24/2023 1907   LEUKOCYTESUR TRACE (A) 10/24/2023 2145   LEUKOCYTESUR Negative 10/24/2023 1907   Sepsis Labs: @LABRCNTIP (procalcitonin:4,lacticidven:4) ) Recent Results (from the past 240 hours)  Urine Culture     Status: None   Collection Time: 10/22/23  9:15 PM   Specimen: Urine, Random  Result Value Ref Range Status   Specimen Description   Final    URINE, RANDOM Performed at [facility], [address], [place], [place] [address]    Special Requests   Final    NONE Reflexed from [id number] Performed at [facility], [address] [place], [place]  [address]    Culture   Final    NO GROWTH Performed at [facility], [address], [place], [place] [address]    Report Status 10/24/2023 FINAL  Final  Resp panel by RT-PCR (RSV, Flu A&B, Covid) Anterior Nasal Swab     Status: None   Collection Time: 10/24/23  9:39 PM   Specimen: Anterior Nasal Swab  Result Value Ref Range Status   SARS Coronavirus 2 by RT PCR NEGATIVE NEGATIVE Final   Influenza A by PCR NEGATIVE NEGATIVE Final   Influenza B by PCR NEGATIVE NEGATIVE Final    Comment: (NOTE) The Xpert Xpress SARS-CoV-2/FLU/RSV plus assay is intended as an aid in the diagnosis of influenza from Nasopharyngeal swab specimens and should not be used as a sole basis for treatment. Nasal washings and aspirates are unacceptable for Xpert Xpress SARS-CoV-2/FLU/RSV testing.  Fact Sheet for Patients: BloggerCourse.com  Fact Sheet for Healthcare Providers: SeriousBroker.it  This test is not yet approved or cleared by the [place]  FDA and has been authorized for detection and/or diagnosis of SARS-CoV-2 by FDA under an Emergency Use Authorization (EUA). This EUA will remain in effect (meaning this test can be used) for the duration of the COVID-19 declaration under Section 564(b)(1) of the Act, 21 U.S.C. section 360bbb-3(b)(1), unless the authorization is terminated or revoked.     Resp Syncytial Virus by PCR NEGATIVE NEGATIVE Final    Comment: (NOTE) Fact Sheet for Patients: BloggerCourse.com  Fact Sheet for Healthcare Providers: SeriousBroker.it  This test is not yet approved or cleared by the [place]  FDA and has been authorized for detection and/or diagnosis of SARS-CoV-2 by FDA under an Emergency Use Authorization (EUA). This EUA will remain in effect (meaning this test can be used) for the duration of the COVID-19 declaration under Section 564(b)(1) of the Act, 21  U.S.C. section 360bbb-3(b)(1), unless the authorization is terminated or revoked.  Performed at [facility], [address] [place], [place] [address]   Urine Culture     Status: None (Preliminary result)   Collection Time: 10/24/23  9:45 PM   Specimen: Urine, Clean Catch  Result Value Ref Range Status   Specimen Description URINE, CLEAN CATCH  Final   Special Requests   Final    NONE  Reflexed from [id number] Performed at [facility], [address] [place], [place] [address]    Culture PENDING  Incomplete   Report Status PENDING  Incomplete     Radiological Exams on Admission: CT ABDOMEN PELVIS W CONTRAST Addendum Date: 10/24/2023 ADDENDUM REPORT: 10/24/2023 22:36 ADDENDUM: Delayed images hand images on the chest CT show areas of decreased perfusion within the upper pole and possibly lower pole of the left kidney compatible with pyelonephritis. IMPRESSION: Left pyelonephritis. Electronically Signed   By: Franky Crease M.D.   On: 10/24/2023 22:36   Result Date: 10/24/2023 CLINICAL DATA:  Abdominal pain EXAM: CT ABDOMEN AND PELVIS WITH CONTRAST TECHNIQUE: Multidetector CT imaging of the abdomen and pelvis was performed using the standard protocol following bolus administration of intravenous contrast. RADIATION DOSE REDUCTION: This exam was performed according to the departmental dose-optimization program which includes automated exposure control, adjustment of the mA and/or kV according to patient size and/or use of iterative reconstruction technique. CONTRAST:  75mL OMNIPAQUE  IOHEXOL  350 MG/ML SOLN COMPARISON:  10/22/2023 FINDINGS: Lower chest: No acute findings Hepatobiliary: No focal liver abnormality is seen. Status post cholecystectomy. No biliary dilatation. Pancreas: No focal abnormality or ductal dilatation. Spleen: No focal abnormality.  Normal size. Adrenals/Urinary Tract: Stable left renal cysts. No follow-up imaging recommended. No stones or hydronephrosis. Adrenal  glands and urinary bladder unremarkable. Stomach/Bowel: Scattered colonic diverticulosis. No active diverticulitis. Stomach and small bowel decompressed. No bowel obstruction or inflammatory process. Vascular/Lymphatic: Aortoiliac atherosclerosis. No evidence of aneurysm or adenopathy. Reproductive: No visible focal abnormality. Other: No free fluid or free air. Musculoskeletal: No acute bony abnormality. IMPRESSION: 1. No acute findings in the abdomen or pelvis. 2. Aortic atherosclerosis. 3. Colonic diverticulosis. Electronically Signed: By: Franky Crease M.D. On: 10/24/2023 22:22   CT Chest Wo Contrast Result Date: 10/24/2023 EXAM: CT CHEST WITHOUT CONTRAST 10/24/2023 10:19:04 PM TECHNIQUE: CT of the chest was performed without the administration of intravenous contrast. Multiplanar reformatted images are provided for review. Automated exposure control, iterative reconstruction, and/or weight based adjustment of the mA/kV was utilized to reduce the radiation dose to as low as reasonably achievable. COMPARISON: Chest radiograph dated 10/24/2023. CLINICAL HISTORY: Respiratory illness, nondiagnostic xray. Dr called to add chest after abd scan while pt was still on table- may be residual contrast. FINDINGS: MEDIASTINUM: Heart and pericardium are unremarkable. Moderate 3-vessel coronary atherosclerosis. Thoracic aortic atherosclerosis. The central airways are clear. LYMPH NODES: No mediastinal, hilar or axillary lymphadenopathy. LUNGS AND PLEURA: No focal consolidation or pulmonary edema. No pleural effusion or pneumothorax. SOFT TISSUES/BONES: No acute abnormality of the bones or soft tissues. UPPER ABDOMEN: Limited images of the upper abdomen demonstrate cholecystectomy. Heterogeneous enhancement of the left kidney, suggesting pyelonephritis. IMPRESSION: 1. No acute findings in the chest. 2. Heterogeneous enhancement of the left kidney, suggesting pyelonephritis. Electronically signed by: Pinkie Pebbles MD  10/24/2023 10:33 PM EDT RP Workstation: [id number]   DG Chest Portable 1 View Result Date: 10/24/2023 EXAM: 1 VIEW(S) XRAY OF THE CHEST 10/24/2023 09:19:16 PM COMPARISON: cxr 08/14/23 cta head and neck 08/14/23 CLINICAL HISTORY: shob. Per chart - Pt been sick for 9 days. Reports having vomiting and diarrhea. Was seen at PCP Monday and blood work was abnormal so sent to ED for further work up. Reports WBC high and potassium low. Reports no one can tell why WBC count going up. Ct scan ; in ED the other night didn't show anything abnormal. FINDINGS: LUNGS AND PLEURA: Chronic coarsened interstitial markings. No focal pulmonary opacity. No overt  pulmonary edema. No pleural effusion. No pneumothorax. HEART AND MEDIASTINUM: Atherosclerotic plaque. Unchanged  cardiac and mediastinal silhouettes. BONES AND SOFT TISSUES: No acute osseous abnormality. IMPRESSION: 1. No acute cardiopulmonary process. 2. Emphysema and atherosclerotic plaque. Electronically signed by: Morgane Naveau MD 10/24/2023 09:27 PM EDT RP Workstation: [id number]    EKG: Independently reviewed.  Normal sinus rhythm.  Assessment/Plan Principal Problem:   Pyelonephritis Active Problems:   Leucocytosis   Essential hypertension, benign   Hypokalemia   Chronic systolic heart failure (HCC)    Pyelonephritis -    cultures on October 15, 2023 done as outpatient results available in Care Everywhere grew Enterobacter cloacae which was multidrug-resistant but sensitive to Levaquin  and has been started.  Follow repeat cultures. Leukocytosis  -   patient does have chronic leukocytosis but is acutely worsening likely from #1.  Follow cultures.  Continue Levaquin . Nausea vomiting likely from #1 which is improving able to tolerate diet in the ER. Chronic HFrEF appears compensated.  Last EF measured on August 2025 showed EF of 30 to 35%.  Patient is on Lasix  and eplerenone  digoxin  ARB and SGLT2 inhibitor.  Potassium is on the lower side I have replaced.  If  potassium is still low we may have to hold Lasix . History of recent stroke in August 2025.  On antiplatelet agents and statins. Hypokalemia replace and recheck. Hypertension will continue patient's GMDT medications as listed on #3. Alcohol use patient drinks 3 or 4 drinks a week.  Usually brandy.  Last drink today.  Closely monitor for any withdrawals.  On CIWA.  Mildly elevated LFT follow LFTs. Anemia hemoglobin in September 2025 was around 12.2.  Follow CBC.  May check anemia panel with next blood draw.  Since patient has significant leukocytosis with pyelonephritis will follow cultures and need further workup and more than 2 midnight stay.   DVT prophylaxis: Lovenox . Code Status: Full code. Family Communication: Discussed with patient. Disposition Plan: Monitored bed. Consults called: None. Admission status: Observation.

## 2023-10-25 NOTE — ED Notes (Signed)
 Pt amb to RR independently

## 2023-10-25 NOTE — Hospital Course (Signed)
 63 yo M with recent stroke, new HFrEF and recent diagnosis of UTI presented with persistent weakness found to have pyelonephritis.

## 2023-10-25 NOTE — Progress Notes (Signed)
    Patient: Henry Schmitt. FMW:[id number] DOB: [birth year]      Brief hospital course: 63 yo M with recent stroke, new HFrEF and recent diagnosis of UTI presented with persistent weakness found to have pyelonephritis.     This is a no charge note, for further details, please see the H&P by my partner, Dr. Franky from earlier today.   Principal Problem:   Pyelonephritis Active Problems:   Leucocytosis   Essential hypertension, benign   Hypokalemia   Chronic systolic heart failure (HCC)   History of ischemic stroke    Continue Levaquin  Follow WBC and urine culture       Physical Exam: BP 130/62 (BP Location: Left Arm)   Pulse 60   Temp 98.2 F (36.8 C) (Oral)   Resp 18   Ht 5' 6 (1.676 m)   Wt 77.6 kg   SpO2 96%   BMI 27.61 kg/m   Patient seen and examined.             Author: Lonni SHAUNNA Dalton, MD 10/25/2023 6:32 PM

## 2023-10-26 ENCOUNTER — Other Ambulatory Visit (HOSPITAL_COMMUNITY): Payer: Self-pay

## 2023-10-26 DIAGNOSIS — N12 Tubulo-interstitial nephritis, not specified as acute or chronic: Secondary | ICD-10-CM | POA: Diagnosis not present

## 2023-10-26 LAB — BASIC METABOLIC PANEL WITH GFR
Anion gap: 10 (ref 5–15)
BUN: 9 mg/dL (ref 8–23)
CO2: 24 mmol/L (ref 22–32)
Calcium: 8.3 mg/dL — ABNORMAL LOW (ref 8.9–10.3)
Chloride: 104 mmol/L (ref 98–111)
Creatinine, Ser: 0.97 mg/dL (ref 0.61–1.24)
GFR, Estimated: >60 mL/min (ref >60)
Glucose, Bld: 146 mg/dL — ABNORMAL HIGH (ref 70–99)
Potassium: 3.8 mmol/L (ref 3.5–5.1)
Sodium: 138 mmol/L (ref 135–145)

## 2023-10-26 LAB — CBC
HCT: 31.4 % — ABNORMAL LOW (ref 39.0–52.0)
Hemoglobin: 10.1 g/dL — ABNORMAL LOW (ref 13.0–17.0)
MCH: 30.1 pg (ref 26.0–34.0)
MCHC: 32.2 g/dL (ref 30.0–36.0)
MCV: 93.7 fL (ref 80.0–100.0)
Platelets: 453 K/uL — ABNORMAL HIGH (ref 150–400)
RBC: 3.35 MIL/uL — ABNORMAL LOW (ref 4.22–5.81)
RDW: 13.4 % (ref 11.5–15.5)
WBC: 17.1 K/uL — ABNORMAL HIGH (ref 4.0–10.5)
nRBC: 0 % (ref 0.0–0.2)

## 2023-10-26 MED ORDER — LEVOFLOXACIN 750 MG PO TABS
750.0000 mg | ORAL_TABLET | Freq: Every day | ORAL | 0 refills | Status: AC
  Filled 2023-10-26: qty 8, 8d supply, fill #0

## 2023-10-26 NOTE — TOC Initial Note (Addendum)
 Transition of Care (TOC) - Initial/Assessment Note    Patient Details  Name: Henry Schmitt. MRN: [id number] Date of Birth: [birth year]  Transition of Care [facility]) CM/SW Contact:    Lendia Dais, LCSWA Phone Number: 10/26/2023, 9:34 AM  Clinical Narrative:  Pt is from home alone and uses no DME at baseline. Pt is independent with ADL's.  Has seen PCP Hoy Miu in the past year and takes medications as prescribed. Pt reports a hx of depression and states that he receives meds for depression but does not have an outpatient provider. CSW offered resources for MH and pt declined. Pt mentioned a hx of alcohol and SU but declined any current struggles with alcohol/substance use.  Pt states they drove themselves to hospital and with drive themselves home.  No further TOC/Inpatient Case Management (ICM) needs at this time. Please place [facility] consult for needs.   Expected Discharge Plan: Home/Self Care Barriers to Discharge: Continued Medical Work up   Patient Goals and CMS Choice Patient states their goals for this hospitalization and ongoing recovery are:: Return home   Choice offered to / list presented to : NA      Expected Discharge Plan and Services In-house Referral: Clinical Social Work     Living arrangements for the past 2 months: Single Family Home                                      Prior Living Arrangements/Services Living arrangements for the past 2 months: Single Family Home Lives with:: Self Patient language and need for interpreter reviewed:: Yes Do you feel safe going back to the place where you live?: Yes      Need for Family Participation in Patient Care: No (Comment) Care giver support system in place?: No (comment)   Criminal Activity/Legal Involvement Pertinent to Current Situation/Hospitalization: No - Comment as needed  Activities of Daily Living   ADL Screening (condition at time of admission) Independently performs ADLs?: Yes  (appropriate for developmental age) Is the patient deaf or have difficulty hearing?: No Does the patient have difficulty seeing, even when wearing glasses/contacts?: No Does the patient have difficulty concentrating, remembering, or making decisions?: No  Permission Sought/Granted Permission sought to share information with : Family Supports Permission granted to share information with : Yes, Verbal Permission Granted  Share Information with NAME: Arland Lesches     Permission granted to share info w Relationship: Sister  Permission granted to share info w Contact Information: [phone]  Emotional Assessment Appearance:: Appears stated age Attitude/Demeanor/Rapport: Engaged Affect (typically observed): Appropriate, Pleasant Orientation: : Oriented to Situation, Oriented to Self, Oriented to Place, Oriented to  Time Alcohol / Substance Use: Alcohol Use, Illicit Drugs Psych Involvement: No (comment)  Admission diagnosis:  Pyelonephritis [N12] Infection due to multidrug-resistant Enterobacter cloacae [A49.8, Z16.24] Patient Active Problem List   Diagnosis Date Noted   Pyelonephritis 10/25/2023   History of ischemic stroke 10/25/2023   Acute combined systolic and diastolic heart failure (HCC) 08/24/2023   Chronic systolic heart failure (HCC) 08/24/2023   Acute CVA (cerebrovascular accident) (HCC) 08/14/2023   LBBB (left bundle branch block) 08/14/2023   Smoker 02/12/2023   Influenza A 02/03/2016   Hypokalemia 02/03/2016   Bronchitis 02/03/2016   Anemia 02/03/2016   Aortitis 04/18/2015   Abdominal pain 04/18/2015   Essential hypertension, benign 04/18/2015   Leucocytosis 05/07/2014   Hypogonadotropic hypogonadism (HCC) 04/06/2014  Gynecomastia, male 03/18/2014   PCP:  [name], PA-C Pharmacy:   [facility] [address] [place] [address] Phone: [phone] Fax: [phone]  [id number] [place], [place] - [address] AT [facility] [address] [place] [address] Phone: [phone] Fax: [phone]     Social Drivers of Health (SDOH) Social History: SDOH Screenings   Food Insecurity: No Food Insecurity (10/25/2023)  Housing: Unknown (10/25/2023)  Transportation Needs: No Transportation Needs (10/25/2023)  Utilities: Not At Risk (10/25/2023)  Financial Resource Strain: Low Risk  (02/12/2023)   Received from [facility]  Physical Activity: Unknown (02/12/2023)   [facility]  Social Connections: Moderately Integrated (02/12/2023)   Received from [facility]  Stress: No Stress Concern Present (02/12/2023)   Received from [facility]  Tobacco Use: High Risk (10/25/2023)   SDOH Interventions:     Readmission Risk Interventions     No data to display

## 2023-10-26 NOTE — TOC CAGE-AID Note (Addendum)
 Transition of Care [facility]) - CAGE-AID Screening   Patient Details  Name: Henry Schmitt. MRN: [id number] Date of Birth: [birth year]  Transition of Care [facility]) CM/SW Contact:    Lendia Dais, LCSWA Phone Number: 10/26/2023, 9:39 AM   Clinical Narrative: CSW introduced self and role. CSW informed the pt of the consult for assessment and ask permission to do the assessment. Pt was agreeable. Pt mentioned that they have a hx of alcohol/drug use but it is not a current struggle.  Pt answered no to all assessment questions and explained if a patient answers yes to two or more questions, then resources would be offered. Pt stated understanding.  No resources offered.    CAGE-AID Screening:    Have You Ever Felt You Ought to Cut Down on Your Drinking or Drug Use?: No Have People Annoyed You By Critizing Your Drinking Or Drug Use?: No Have You Felt Bad Or Guilty About Your Drinking Or Drug Use?: No Have You Ever Had a Drink or Used Drugs First Thing In The Morning to Steady Your Nerves or to Get Rid of a Hangover?: No CAGE-AID Score: 0  Substance Abuse Education Offered: No

## 2023-10-26 NOTE — TOC Progression Note (Signed)
 Transition of Care (TOC) - Progression Note    Patient Details  Name: Henry Schmitt. MRN: [id number] Date of Birth: [birth year]  Transition of Care [facility]) CM/SW Contact  Lendia Dais, [place] Phone Number: 10/26/2023, 12:16 PM  Clinical Narrative:  Pt will discharge home with self care. Pt stated they will drive themselves home.  No further TOC needs.    Expected Discharge Plan: Home/Self Care Barriers to Discharge: Barriers Resolved               Expected Discharge Plan and Services In-house Referral: Clinical Social Work     Living arrangements for the past 2 months: Single Family Home Expected Discharge Date: 10/26/23                                     Social Drivers of Health (SDOH) Interventions SDOH Screenings   Food Insecurity: No Food Insecurity (10/25/2023)  Housing: Unknown (10/25/2023)  Transportation Needs: No Transportation Needs (10/25/2023)  Utilities: Not At Risk (10/25/2023)  Financial Resource Strain: Low Risk  (02/12/2023)   Received from [facility]  Physical Activity: Unknown (02/12/2023)   [facility] from [facility]  Social Connections: Moderately Integrated (02/12/2023)   [facility] from [facility]  Stress: No Stress Concern Present (02/12/2023)   Received from [facility]  Tobacco Use: High Risk (10/25/2023)    Readmission Risk Interventions     No data to display

## 2023-10-26 NOTE — Discharge Summary (Signed)
 Physician Discharge Summary   Patient: Henry Schmitt. MRN: [id number] DOB: [birth year]  Admit date:     10/24/2023  Discharge date: 10/26/23  Discharge Physician: Henry Schmitt   PCP: Henry Hoy CROME, PA-C     Recommendations at discharge:  Follow up with PCP Hoy Henry in 1 week for pyelonephritis Hoy Henry: Consider stopping Farxiga  in setting of hospitalization for UTI Please check CBC and basic metabolic panel in 1 week      Hospital Course: 63 yo M with recent stroke, new HFrEF and recent diagnosis of UTI presented with persistent weakness found to have pyelonephritis.     Pyelonephritis Patient had recently been diagnosed with UTI, Enterobacter cloacae, treated with 5 days of Levaquin  and symptoms improved.  He then stopped Levaquin , his malaise and fatigue worsened, so he came to the ER where he was found to have white count 21K, imaging findings consistent with pyelonephritis.  He was started on IV Levaquin , for 48 hours, and had clinical improvement.  His white count was improving, and still elevated however he was clinically feeling extremely well, eating well, asymptomatic, and requested discharge.  He has close follow-up with PCP, repeat urine culture here was negative, and we know his previous culture was susceptible to Levaquin  which is excellent bioavailability.  Charged to complete 10 days Levaquin , provided with antibiotics from the hospital   Chronic systolic congestive heart failure Cerebrovascular disease Hypertension Blood pressure stable on home regimen of carvedilol , digoxin , eplerenone , Lasix , losartan .  Aspirin  and Lipitor continued at discharge.  Farxiga  held given complicated UTI, recommend reconsideration of risk-benefit.  Depression QTc 440, sertraline continued with abx.  Hypokalemia Supplemented and resolved  Normocytic anemia Follow up with PCP          The [place]  Controlled Substances  Registry was reviewed for this patient prior to discharge.   Consultants: None needed Procedures performed: The abdomen and pelvis Disposition: Home Diet recommendation:  Discharge Diet Orders (From admission, onward)     Start     Ordered   10/26/23 0000  Diet - low sodium heart healthy        10/26/23 1025             DISCHARGE MEDICATION: Allergies as of 10/26/2023       Reactions   Antihistamines, Chlorpheniramine-type Hives, Swelling   Lisinopril Shortness Of Breath, Swelling   Peanut-containing Drug Products Swelling   ALL TREE NUTS        Medication List     PAUSE taking these medications    dapagliflozin  propanediol 10 MG Tabs tablet Wait to take this until your doctor or other care provider tells you to start again. Commonly known as: FARXIGA  Take 1 tablet (10 mg total) by mouth daily.       TAKE these medications    acetaminophen  500 MG tablet Commonly known as: TYLENOL  Take 1,000 mg by mouth every 6 (six) hours as needed for mild pain (pain score 1-3).   aspirin  EC 81 MG tablet Take 1 tablet (81 mg total) by mouth daily. Swallow whole.   atorvastatin  40 MG tablet Commonly known as: LIPITOR Take 1 tablet (40 mg total) by mouth daily.   carvedilol  12.5 MG tablet Commonly known as: COREG  Take 1 tablet (12.5 mg total) by mouth 2 (two) times daily with a meal.   digoxin  0.125 MG tablet Commonly known as: LANOXIN  Take 1 tablet (0.125 mg total) by mouth daily.   eplerenone  25 MG tablet Commonly  known as: INSPRA  Take 1 tablet (25 mg total) by mouth daily.   furosemide  40 MG tablet Commonly known as: Lasix  Take 1 tablet (40 mg total) by mouth daily.   levofloxacin  750 MG tablet Commonly known as: Levaquin  Take 1 tablet (750 mg total) by mouth at bedtime for 8 days.   losartan  25 MG tablet Commonly known as: Cozaar  Take 1 tablet (25 mg total) by mouth daily.   potassium chloride  SA 20 MEQ tablet Commonly known as: KLOR-CON  M Take 1  tablet (20 mEq total) by mouth 2 (two) times daily.   sertraline 50 MG tablet Commonly known as: ZOLOFT Take 50 mg by mouth daily.   varenicline 0.5 MG tablet Commonly known as: CHANTIX Take 0.5 mg by mouth daily.   zolpidem 10 MG tablet Commonly known as: AMBIEN Take 10 mg by mouth at bedtime as needed.        Follow-up Information     Henry Hoy CROME, PA-C. Schedule an appointment as soon as possible for a visit in 1 week(s).   Specialty: Physician Assistant Contact information: [address] [place] [address] [phone]                 Discharge Instructions     Diet - low sodium heart healthy   Complete by: As directed    Discharge instructions   Complete by: As directed    **IMPORTANT DISCHARGE INSTRUCTIONS**   From Dr. Jonel: You were admitted for pyelonephritis (That means, infection in the kidney)  We suspect it is from the same Enterobacter bacteria that you had on your culture recently.  Here, we repeated the urine culture, and it wasn't growing anything, but when we did a CT imaging, it showed inflammation of the kidney consistent with pyelonephritis  Take levofloxacin /Levaquin  750 mg nightly for the next 8 days, starting tonight  Do NOT take your dapagliflozin /Farxiga  for now but you may resume all your other home medicines Farxiga  raises sugar levels in the urine, which fosters bacterial growth in theory.  There is controversy about whether this raises the risk of urinary tract infections, but for now, while treating an infeciton, do not take it and discuss with your doctor before resuming  While taking Levaquin , if you dvelop any new joint pains, discuss with your doctor   Return for worsening pain, vomting, or fever that won't go away.   Increase activity slowly   Complete by: As directed        Discharge Exam: Filed Weights   10/24/23 2004  Weight: 77.6 kg    General: Pt is alert, awake, not in acute  distress Cardiovascular: RRR, nl S1-S2, no murmurs appreciated.   No LE edema.   Respiratory: Normal respiratory rate and rhythm.  CTAB without rales or wheezes. Abdominal: Abdomen soft and non-tender.  No distension or HSM.  No CVA tenderness. Neuro/Psych: Strength symmetric in upper and lower extremities.  Judgment and insight appear normal.   Condition at discharge: good  The results of significant diagnostics from this hospitalization (including imaging, microbiology, ancillary and laboratory) are listed below for reference.   Imaging Studies: CT ABDOMEN PELVIS W CONTRAST Addendum Date: 10/24/2023 ADDENDUM REPORT: 10/24/2023 22:36 ADDENDUM: Delayed images hand images on the chest CT show areas of decreased perfusion within the upper pole and possibly lower pole of the left kidney compatible with pyelonephritis. IMPRESSION: Left pyelonephritis. Electronically Signed   By: Franky Crease M.D.   On: 10/24/2023 22:36   Result  Date: 10/24/2023 CLINICAL DATA:  Abdominal pain EXAM: CT ABDOMEN AND PELVIS WITH CONTRAST TECHNIQUE: Multidetector CT imaging of the abdomen and pelvis was performed using the standard protocol following bolus administration of intravenous contrast. RADIATION DOSE REDUCTION: This exam was performed according to the departmental dose-optimization program which includes automated exposure control, adjustment of the mA and/or kV according to patient size and/or use of iterative reconstruction technique. CONTRAST:  75mL OMNIPAQUE  IOHEXOL  350 MG/ML SOLN COMPARISON:  10/22/2023 FINDINGS: Lower chest: No acute findings Hepatobiliary: No focal liver abnormality is seen. Status post cholecystectomy. No biliary dilatation. Pancreas: No focal abnormality or ductal dilatation. Spleen: No focal abnormality.  Normal size. Adrenals/Urinary Tract: Stable left renal cysts. No follow-up imaging recommended. No stones or hydronephrosis. Adrenal glands and urinary bladder unremarkable.  Stomach/Bowel: Scattered colonic diverticulosis. No active diverticulitis. Stomach and small bowel decompressed. No bowel obstruction or inflammatory process. Vascular/Lymphatic: Aortoiliac atherosclerosis. No evidence of aneurysm or adenopathy. Reproductive: No visible focal abnormality. Other: No free fluid or free air. Musculoskeletal: No acute bony abnormality. IMPRESSION: 1. No acute findings in the abdomen or pelvis. 2. Aortic atherosclerosis. 3. Colonic diverticulosis. Electronically Signed: By: Franky Crease M.D. On: 10/24/2023 22:22   CT Chest Wo Contrast Result Date: 10/24/2023 EXAM: CT CHEST WITHOUT CONTRAST 10/24/2023 10:19:04 PM TECHNIQUE: CT of the chest was performed without the administration of intravenous contrast. Multiplanar reformatted images are provided for review. Automated exposure control, iterative reconstruction, and/or weight based adjustment of the mA/kV was utilized to reduce the radiation dose to as low as reasonably achievable. COMPARISON: Chest radiograph dated 10/24/2023. CLINICAL HISTORY: Respiratory illness, nondiagnostic xray. Dr called to add chest after abd scan while pt was still on table- may be residual contrast. FINDINGS: MEDIASTINUM: Heart and pericardium are unremarkable. Moderate 3-vessel coronary atherosclerosis. Thoracic aortic atherosclerosis. The central airways are clear. LYMPH NODES: No mediastinal, hilar or axillary lymphadenopathy. LUNGS AND PLEURA: No focal consolidation or pulmonary edema. No pleural effusion or pneumothorax. SOFT TISSUES/BONES: No acute abnormality of the bones or soft tissues. UPPER ABDOMEN: Limited images of the upper abdomen demonstrate cholecystectomy. Heterogeneous enhancement of the left kidney, suggesting pyelonephritis. IMPRESSION: 1. No acute findings in the chest. 2. Heterogeneous enhancement of the left kidney, suggesting pyelonephritis. Electronically signed by: Pinkie Pebbles MD 10/24/2023 10:33 PM EDT RP Workstation:  [id number]   DG Chest Portable 1 View Result Date: 10/24/2023 EXAM: 1 VIEW(S) XRAY OF THE CHEST 10/24/2023 09:19:16 PM COMPARISON: cxr 08/14/23 cta head and neck 08/14/23 CLINICAL HISTORY: shob. Per chart - Pt been sick for 9 days. Reports having vomiting and diarrhea. Was seen at PCP Monday and blood work was abnormal so sent to ED for further work up. Reports WBC high and potassium low. Reports no one can tell why WBC count going up. Ct scan ; in ED the other night didn't show anything abnormal. FINDINGS: LUNGS AND PLEURA: Chronic coarsened interstitial markings. No focal pulmonary opacity. No overt pulmonary edema. No pleural effusion. No pneumothorax. HEART AND MEDIASTINUM: Atherosclerotic plaque. Unchanged  cardiac and mediastinal silhouettes. BONES AND SOFT TISSUES: No acute osseous abnormality. IMPRESSION: 1. No acute cardiopulmonary process. 2. Emphysema and atherosclerotic plaque. Electronically signed by: Kate Plummer MD 10/24/2023 09:27 PM EDT RP Workstation: [id number]   CT ABDOMEN PELVIS W CONTRAST Result Date: 10/22/2023 CLINICAL DATA:  Polytrauma, blunt EXAM: CT ABDOMEN AND PELVIS WITH CONTRAST TECHNIQUE: Multidetector CT imaging of the abdomen and pelvis was performed using the standard protocol following bolus administration of intravenous contrast. RADIATION DOSE REDUCTION: This exam was  performed according to the departmental dose-optimization program which includes automated exposure control, adjustment of the mA and/or kV according to patient size and/or use of iterative reconstruction technique. CONTRAST:  OMNIPAQUE  IOHEXOL  300 MG/ML  SOLN COMPARISON:  10/31/2019 FINDINGS: Lower chest: No acute findings Hepatobiliary: No focal liver abnormality is seen. Status post cholecystectomy. No biliary dilatation. Pancreas: No focal abnormality or ductal dilatation. Spleen: No focal abnormality.  Normal size. Adrenals/Urinary Tract: Adrenal glands normal. 2 cm cyst in the midpole of the left  kidney. No follow-up imaging recommended. No stones or hydronephrosis. Urinary bladder unremarkable. Stomach/Bowel: Normal appendix. Left colonic diverticulosis. No active diverticulitis. Stomach and small bowel decompressed. Vascular/Lymphatic: Aortic atherosclerosis. No evidence of aneurysm or adenopathy. Reproductive: No visible focal abnormality. Other: No free fluid or free air. Musculoskeletal: No acute bony abnormality. IMPRESSION: 1. No acute findings in the abdomen or pelvis. 2. Aortic atherosclerosis. 3. Left colonic diverticulosis. Electronically Signed   By: Franky Crease M.D.   On: 10/22/2023 23:21   MR CARDIAC MORPHOLOGY W WO CONTRAST Result Date: 10/10/2023 CLINICAL DATA:  Heart failure, systolic, determine etiology COMPARISON: Echo 08/14/23 EXAM: MR CARDIA MORPHOLOGY WITHOUT AND WITH CONTRAST; MR CARDIAC VELOCITY FLOW MAPPING TECHNIQUE: The patient was scanned on a 1.5 Tesla Siemens magnet. A dedicated cardiac coil was used. Functional imaging was done using TrueFisp sequences. 2,3, and 4 chamber views were done to assess for RWMA's. Modified Simpson's rule using a short axis stack was used to calculate an ejection fraction on a dedicated work [employer]. The patient received 10mL GADAVIST GADOBUTROL 1 MMOL/ML IV SOLN. After 10 minutes inversion recovery sequences were used to assess for infiltration and scar tissue. Phase contrast velocity encoded images obtained x 2. This examination is tailored for evaluation cardiac anatomy and function and provides very limited assessment of noncardiac structures, which are accordingly not evaluated during interpretation. If there is clinical concern for extracardiac pathology, further evaluation with CT imaging should be considered. FINDINGS: LEFT VENTRICLE: Left ventricular chamber size: Normal. Left ventricular wall thickness: Mildly increased. Maximal wall thickness 12 mm. Left ventricular systolic function: Mildly reduced LVEF = 46% Mid  anterior wall hypokinesis. No myocardial edema, T2 = 47 msec Normal first pass perfusion. There is post contrast delayed myocardial enhancement: Mid anterior wall subendocardial and midmyocardial delayed enhancement, ~50% of myocardial thickness. Focal in the mid ventricle. Suggestive of prior infarct. Normal T1 myocardial nulling kinetics suggest against a diagnosis of cardiac amyloidosis. ECV = 27% RIGHT VENTRICLE: Normal right ventricular chamber size. Normal right ventricular wall thickness. Normal right ventricular systolic function. RVEF = 52% There are no regional wall motion abnormalities. No post contrast delayed myocardial enhancement. ATRIA: Normal PERICARDIUM: Normal pericardium.  No pericardial effusion. OTHER: No significant extracardiac findings. MEASUREMENTS: Qp/Qs: 1.0 VALVES: Aortic valve regurgitation: Trivial, regurgitant fraction 2% Pulmonary valve regurgitation: Trivial, regurgitant fraction 2% Mitral valve regurgitation: None Tricuspid valve regurgitation: none Left ventricle: LV male LV EF: 46% (normal 49-79%) Absolute volumes: LV EDV: (Normal 95-215 mL) LV ESV: 75mL (Normal 25-85 mL) LV SV: 63mL (Normal 61-145 mL) CO: 3.6L/min (Normal 3.4-7.8 L/min) Indexed volumes: CI: 1.8L/min/sq-m (Normal 1.8-4.2 L/min/sq-m) LV EDV: 106mL/sq-m (Normal 50-108 mL/sq-m) LV ESV: 38mL/sq-m (Normal 11-47 mL/sq-m) LV SV: 75mL/sq-m (Normal 33-72 mL/sq-m) Right ventricle: RV male RV EF:  52% (Normal 51-80%) Absolute volumes: RV EDV: (Normal 109-217 mL) RV ESV: 55mL (Normal 23-91 mL) RV SV: 60mL (Normal 71-141 mL) CO: 3.4L/min (Normal 2.8-8.8 L/min) Indexed volumes: CI: 1.7L/min/sq-m (Normal 1.7-4.2 L/min/sq-m) RV  EDV: 40mL/sq-m (Normal 58-109 mL/sq-m) RV ESV: 31mL/sq-m (Normal 12-46 mL/sq-m) RV SV: 49mL/sq-m (Normal 38-71 mL/sq-m) IMPRESSION: 1. Mildly reduced left ventricular systolic function with normal left ventricular chamber size. LVEF 46%. Focal mid anterior wall infarct with possible viability.  No evidence of infiltrative myocardial process. 2. Normal right ventricular systolic function with normal RV chamber size. RVEF 52%. Electronically Signed   By: Soyla Merck M.D.   On: 10/10/2023 15:50   MR CARDIAC VELOCITY FLOW MAP Result Date: 10/10/2023 CLINICAL DATA:  Heart failure, systolic, determine etiology COMPARISON: Echo 08/14/23 EXAM: MR CARDIA MORPHOLOGY WITHOUT AND WITH CONTRAST; MR CARDIAC VELOCITY FLOW MAPPING TECHNIQUE: The patient was scanned on a 1.5 Tesla Siemens magnet. A dedicated cardiac coil was used. Functional imaging was done using TrueFisp sequences. 2,3, and 4 chamber views were done to assess for RWMA's. Modified Simpson's rule using a short axis stack was used to calculate an ejection fraction on a dedicated work [employer], [employer]. The patient received 10mL GADAVIST GADOBUTROL 1 MMOL/ML IV SOLN. After 10 minutes inversion recovery sequences were used to assess for infiltration and scar tissue. Phase contrast velocity encoded images obtained x 2. This examination is tailored for evaluation cardiac anatomy and function and provides very limited assessment of noncardiac structures, which are accordingly not evaluated during interpretation. If there is clinical concern for extracardiac pathology, further evaluation with CT imaging should be considered. FINDINGS: LEFT VENTRICLE: Left ventricular chamber size: Normal. Left ventricular wall thickness: Mildly increased. Maximal wall thickness 12 mm. Left ventricular systolic function: Mildly reduced LVEF = 46% Mid anterior wall hypokinesis. No myocardial edema, T2 = 47 msec Normal first pass perfusion. There is post contrast delayed myocardial enhancement: Mid anterior wall subendocardial and midmyocardial delayed enhancement, ~50% of myocardial thickness. Focal in the mid ventricle. Suggestive of prior infarct. Normal T1 myocardial nulling kinetics suggest against a diagnosis of cardiac amyloidosis. ECV = 27% RIGHT VENTRICLE:  Normal right ventricular chamber size. Normal right ventricular wall thickness. Normal right ventricular systolic function. RVEF = 52% There are no regional wall motion abnormalities. No post contrast delayed myocardial enhancement. ATRIA: Normal PERICARDIUM: Normal pericardium.  No pericardial effusion. OTHER: No significant extracardiac findings. MEASUREMENTS: Qp/Qs: 1.0 VALVES: Aortic valve regurgitation: Trivial, regurgitant fraction 2% Pulmonary valve regurgitation: Trivial, regurgitant fraction 2% Mitral valve regurgitation: None Tricuspid valve regurgitation: none Left ventricle: LV male LV EF: 46% (normal 49-79%) Absolute volumes: LV EDV: (Normal 95-215 mL) LV ESV: 75mL (Normal 25-85 mL) LV SV: 63mL (Normal 61-145 mL) CO: 3.6L/min (Normal 3.4-7.8 L/min) Indexed volumes: CI: 1.8L/min/sq-m (Normal 1.8-4.2 L/min/sq-m) LV EDV: 51mL/sq-m (Normal 50-108 mL/sq-m) LV ESV: 78mL/sq-m (Normal 11-47 mL/sq-m) LV SV: 72mL/sq-m (Normal 33-72 mL/sq-m) Right ventricle: RV male RV EF:  52% (Normal 51-80%) Absolute volumes: RV EDV: (Normal 109-217 mL) RV ESV: 55mL (Normal 23-91 mL) RV SV: 60mL (Normal 71-141 mL) CO: 3.4L/min (Normal 2.8-8.8 L/min) Indexed volumes: CI: 1.7L/min/sq-m (Normal 1.7-4.2 L/min/sq-m) RV EDV: 86mL/sq-m (Normal 58-109 mL/sq-m) RV ESV: 51mL/sq-m (Normal 12-46 mL/sq-m) RV SV: 27mL/sq-m (Normal 38-71 mL/sq-m) IMPRESSION: 1. Mildly reduced left ventricular systolic function with normal left ventricular chamber size. LVEF 46%. Focal mid anterior wall infarct with possible viability. No evidence of infiltrative myocardial process. 2. Normal right ventricular systolic function with normal RV chamber size. RVEF 52%. Electronically Signed   By: Soyla Merck M.D.   On: 10/10/2023 15:50   MR CARDIAC VELOCITY FLOW MAP Result Date: 10/10/2023 CLINICAL DATA:  Heart failure, systolic, determine etiology COMPARISON: Echo 08/14/23 EXAM: MR  CARDIA MORPHOLOGY WITHOUT AND WITH CONTRAST; MR CARDIAC VELOCITY  FLOW MAPPING TECHNIQUE: The patient was scanned on a 1.5 Tesla Siemens magnet. A dedicated cardiac coil was used. Functional imaging was done using TrueFisp sequences. 2,3, and 4 chamber views were done to assess for RWMA's. Modified Simpson's rule using a short axis stack was used to calculate an ejection fraction on a dedicated work [employer], [employer]. The patient received 10mL GADAVIST GADOBUTROL 1 MMOL/ML IV SOLN. After 10 minutes inversion recovery sequences were used to assess for infiltration and scar tissue. Phase contrast velocity encoded images obtained x 2. This examination is tailored for evaluation cardiac anatomy and function and provides very limited assessment of noncardiac structures, which are accordingly not evaluated during interpretation. If there is clinical concern for extracardiac pathology, further evaluation with CT imaging should be considered. FINDINGS: LEFT VENTRICLE: Left ventricular chamber size: Normal. Left ventricular wall thickness: Mildly increased. Maximal wall thickness 12 mm. Left ventricular systolic function: Mildly reduced LVEF = 46% Mid anterior wall hypokinesis. No myocardial edema, T2 = 47 msec Normal first pass perfusion. There is post contrast delayed myocardial enhancement: Mid anterior wall subendocardial and midmyocardial delayed enhancement, ~50% of myocardial thickness. Focal in the mid ventricle. Suggestive of prior infarct. Normal T1 myocardial nulling kinetics suggest against a diagnosis of cardiac amyloidosis. ECV = 27% RIGHT VENTRICLE: Normal right ventricular chamber size. Normal right ventricular wall thickness. Normal right ventricular systolic function. RVEF = 52% There are no regional wall motion abnormalities. No post contrast delayed myocardial enhancement. ATRIA: Normal PERICARDIUM: Normal pericardium.  No pericardial effusion. OTHER: No significant extracardiac findings. MEASUREMENTS: Qp/Qs: 1.0 VALVES: Aortic valve regurgitation: Trivial,  regurgitant fraction 2% Pulmonary valve regurgitation: Trivial, regurgitant fraction 2% Mitral valve regurgitation: None Tricuspid valve regurgitation: none Left ventricle: LV male LV EF: 46% (normal 49-79%) Absolute volumes: LV EDV: (Normal 95-215 mL) LV ESV: 75mL (Normal 25-85 mL) LV SV: 63mL (Normal 61-145 mL) CO: 3.6L/min (Normal 3.4-7.8 L/min) Indexed volumes: CI: 1.8L/min/sq-m (Normal 1.8-4.2 L/min/sq-m) LV EDV: 55mL/sq-m (Normal 50-108 mL/sq-m) LV ESV: 54mL/sq-m (Normal 11-47 mL/sq-m) LV SV: 12mL/sq-m (Normal 33-72 mL/sq-m) Right ventricle: RV male RV EF:  52% (Normal 51-80%) Absolute volumes: RV EDV: (Normal 109-217 mL) RV ESV: 55mL (Normal 23-91 mL) RV SV: 60mL (Normal 71-141 mL) CO: 3.4L/min (Normal 2.8-8.8 L/min) Indexed volumes: CI: 1.7L/min/sq-m (Normal 1.7-4.2 L/min/sq-m) RV EDV: 63mL/sq-m (Normal 58-109 mL/sq-m) RV ESV: 75mL/sq-m (Normal 12-46 mL/sq-m) RV SV: 71mL/sq-m (Normal 38-71 mL/sq-m) IMPRESSION: 1. Mildly reduced left ventricular systolic function with normal left ventricular chamber size. LVEF 46%. Focal mid anterior wall infarct with possible viability. No evidence of infiltrative myocardial process. 2. Normal right ventricular systolic function with normal RV chamber size. RVEF 52%. Electronically Signed   By: Soyla Merck M.D.   On: 10/10/2023 15:50    Microbiology: Results for orders placed or performed during the hospital encounter of 10/24/23  Culture, blood (routine x 2)     Status: None (Preliminary result)   Collection Time: 10/24/23  9:34 PM   Specimen: BLOOD RIGHT HAND  Result Value Ref Range Status   Specimen Description BLOOD RIGHT HAND  Final   Special Requests   Final    BOTTLES DRAWN AEROBIC AND ANAEROBIC Blood Culture results may not be optimal due to an inadequate volume of blood received in culture bottles   Culture   Final    NO GROWTH 2 DAYS Performed at [facility], [address], [place], [place] [address]  Report Status PENDING   Incomplete  Culture, blood (routine x 2)     Status: None (Preliminary result)   Collection Time: 10/24/23  9:39 PM   Specimen: BLOOD RIGHT ARM  Result Value Ref Range Status   Specimen Description BLOOD RIGHT ARM  Final   Special Requests   Final    BOTTLES DRAWN AEROBIC AND ANAEROBIC Blood Culture results may not be optimal due to an inadequate volume of blood received in culture bottles   Culture   Final    NO GROWTH 2 DAYS Performed at [facility], [address], [place], [place] [address]    Report Status PENDING  Incomplete  Resp panel by RT-PCR (RSV, Flu A&B, Covid) Anterior Nasal Swab     Status: None   Collection Time: 10/24/23  9:39 PM   Specimen: Anterior Nasal Swab  Result Value Ref Range Status   SARS Coronavirus 2 by RT PCR NEGATIVE NEGATIVE Final   Influenza A by PCR NEGATIVE NEGATIVE Final   Influenza B by PCR NEGATIVE NEGATIVE Final    Comment: (NOTE) The Xpert Xpress SARS-CoV-2/FLU/RSV plus assay is intended as an aid in the diagnosis of influenza from Nasopharyngeal swab specimens and should not be used as a sole basis for treatment. Nasal washings and aspirates are unacceptable for Xpert Xpress SARS-CoV-2/FLU/RSV testing.  Fact Sheet for Patients: BloggerCourse.com  Fact Sheet for Healthcare Providers: SeriousBroker.it  This test is not yet approved or cleared by the [place]  FDA and has been authorized for detection and/or diagnosis of SARS-CoV-2 by FDA under an Emergency Use Authorization (EUA). This EUA will remain in effect (meaning this test can be used) for the duration of the COVID-19 declaration under Section 564(b)(1) of the Act, 21 U.S.C. section 360bbb-3(b)(1), unless the authorization is terminated or revoked.     Resp Syncytial Virus by PCR NEGATIVE NEGATIVE Final    Comment: (NOTE) Fact Sheet for Patients: BloggerCourse.com  Fact Sheet for Healthcare  Providers: SeriousBroker.it  This test is not yet approved or cleared by the [place]  FDA and has been authorized for detection and/or diagnosis of SARS-CoV-2 by FDA under an Emergency Use Authorization (EUA). This EUA will remain in effect (meaning this test can be used) for the duration of the COVID-19 declaration under Section 564(b)(1) of the Act, 21 U.S.C. section 360bbb-3(b)(1), unless the authorization is terminated or revoked.  Performed at [facility], [address], [place], [place] [address]   Urine Culture     Status: None   Collection Time: 10/24/23  9:45 PM   Specimen: Urine, Clean Catch  Result Value Ref Range Status   Specimen Description URINE, CLEAN CATCH  Final   Special Requests NONE Reflexed from [id number]  Final   Culture   Final    NO GROWTH Performed at [facility], [address] [place], [place] [address]    Report Status 10/25/2023 FINAL  Final    Labs: CBC: Recent Labs  Lab 10/22/23 1915 10/24/23 2134 10/24/23 2150 10/25/23 0451 10/26/23 0611  WBC 21.0* 24.3*  --  21.9* 17.1*  NEUTROABS 15.0* 19.0*  --   --   --   HGB 11.6* 11.3* 12.2* 9.9* 10.1*  HCT 34.7* 35.5* 36.0* 31.0* 31.4*  MCV 90.4 93.9  --  94.2 93.7  PLT 402* 460*  --  407* 453*   Basic Metabolic Panel: Recent Labs  Lab 10/22/23 1915 10/24/23 2134 10/24/23 2150 10/25/23 0451 10/26/23 0611  NA 136  136 139 137 138  K 2.8* 3.2* 3.1* 3.4* 3.8  CL 96* 99 98 103 104  CO2 26 24  --  24 24  GLUCOSE 190* 126* 125* 130* 146*  BUN 12 6* 6* 6* 9  CREATININE 1.12 0.95 0.90 0.87 0.97  CALCIUM  9.8 8.5*  --  8.0* 8.3*  MG  --  1.8  --   --   --    Liver Function Tests: Recent Labs  Lab 10/22/23 1915 10/24/23 2134  AST 37 34  ALT 45* 55*  ALKPHOS 149* 137*  BILITOT 0.5 0.5  PROT 7.7 6.8  ALBUMIN 3.7 2.7*   CBG: No results for input(s): GLUCAP in the last 168 hours.  Discharge time spent: approximately 45 minutes spent on  discharge counseling, evaluation of patient on day of discharge, and coordination of discharge planning with nursing, social work, pharmacy and case management  Signed: Lonni SHAUNNA Dalton, MD [facility] 10/26/2023

## 2023-10-29 ENCOUNTER — Telehealth (HOSPITAL_COMMUNITY): Payer: Self-pay

## 2023-10-29 LAB — CULTURE, BLOOD (ROUTINE X 2)
Culture: NO GROWTH
Culture: NO GROWTH

## 2023-10-29 NOTE — Telephone Encounter (Signed)
 Patient called and wanted to cancel Cardiac Rehab.

## 2023-10-30 ENCOUNTER — Encounter (HOSPITAL_COMMUNITY)

## 2023-11-05 ENCOUNTER — Other Ambulatory Visit (HOSPITAL_COMMUNITY): Payer: Self-pay

## 2023-11-07 ENCOUNTER — Encounter (HOSPITAL_COMMUNITY)

## 2023-11-09 ENCOUNTER — Encounter (HOSPITAL_COMMUNITY)

## 2023-11-12 ENCOUNTER — Encounter (HOSPITAL_COMMUNITY)

## 2023-11-14 ENCOUNTER — Encounter (HOSPITAL_COMMUNITY)

## 2023-11-16 ENCOUNTER — Encounter (HOSPITAL_COMMUNITY)

## 2023-11-19 ENCOUNTER — Encounter (HOSPITAL_COMMUNITY)

## 2023-11-21 ENCOUNTER — Encounter (HOSPITAL_COMMUNITY)

## 2023-11-23 ENCOUNTER — Encounter (HOSPITAL_COMMUNITY)

## 2023-11-26 ENCOUNTER — Encounter (HOSPITAL_COMMUNITY)

## 2023-11-28 ENCOUNTER — Encounter (HOSPITAL_COMMUNITY)

## 2023-11-30 ENCOUNTER — Encounter (HOSPITAL_COMMUNITY)

## 2023-12-03 ENCOUNTER — Encounter (HOSPITAL_COMMUNITY)

## 2023-12-05 ENCOUNTER — Encounter (HOSPITAL_COMMUNITY)

## 2023-12-07 ENCOUNTER — Encounter (HOSPITAL_COMMUNITY)

## 2023-12-10 ENCOUNTER — Encounter (HOSPITAL_COMMUNITY)

## 2023-12-12 ENCOUNTER — Encounter (HOSPITAL_COMMUNITY)

## 2023-12-14 ENCOUNTER — Encounter (HOSPITAL_COMMUNITY)

## 2023-12-17 ENCOUNTER — Encounter (HOSPITAL_COMMUNITY)

## 2023-12-19 ENCOUNTER — Encounter (HOSPITAL_COMMUNITY)

## 2023-12-21 ENCOUNTER — Encounter (HOSPITAL_COMMUNITY)

## 2023-12-24 ENCOUNTER — Encounter (HOSPITAL_COMMUNITY)

## 2023-12-26 ENCOUNTER — Encounter (HOSPITAL_COMMUNITY)

## 2023-12-27 DIAGNOSIS — I6523 Occlusion and stenosis of bilateral carotid arteries: Secondary | ICD-10-CM

## 2023-12-28 ENCOUNTER — Encounter (HOSPITAL_COMMUNITY)

## 2023-12-31 ENCOUNTER — Encounter (HOSPITAL_COMMUNITY)

## 2024-01-02 ENCOUNTER — Encounter (HOSPITAL_COMMUNITY)

## 2024-01-04 ENCOUNTER — Encounter (HOSPITAL_COMMUNITY)

## 2024-01-07 ENCOUNTER — Encounter (HOSPITAL_COMMUNITY)

## 2024-01-09 ENCOUNTER — Encounter (HOSPITAL_COMMUNITY)

## 2024-01-11 ENCOUNTER — Encounter (HOSPITAL_COMMUNITY)

## 2024-01-11 ENCOUNTER — Ambulatory Visit (HOSPITAL_COMMUNITY): Admitting: Cardiology

## 2024-01-14 ENCOUNTER — Encounter (HOSPITAL_COMMUNITY)

## 2024-01-16 ENCOUNTER — Ambulatory Visit (HOSPITAL_COMMUNITY)
Admission: RE | Admit: 2024-01-16 | Discharge: 2024-01-16 | Disposition: A | Discharge status: Home / Self Care | Source: Ambulatory Visit | Attending: Adult Health | Admitting: Adult Health

## 2024-01-16 ENCOUNTER — Encounter (HOSPITAL_COMMUNITY)

## 2024-01-16 DIAGNOSIS — I6523 Occlusion and stenosis of bilateral carotid arteries: Secondary | ICD-10-CM | POA: Diagnosis present

## 2024-01-16 NOTE — Progress Notes (Signed)
 Carotid artery doppler has been completed.  Results can be found in chart review under CV Proc.  01/16/2024 2:13 PM  Lurlean Kernen Elden Appl, RVT.

## 2024-01-17 ENCOUNTER — Ambulatory Visit: Payer: Self-pay | Admitting: Adult Health

## 2024-01-18 ENCOUNTER — Encounter (HOSPITAL_COMMUNITY)

## 2024-01-21 ENCOUNTER — Encounter (HOSPITAL_COMMUNITY)

## 2024-01-23 ENCOUNTER — Encounter (HOSPITAL_COMMUNITY)

## 2024-01-23 ENCOUNTER — Other Ambulatory Visit (HOSPITAL_COMMUNITY): Payer: Self-pay | Admitting: Family Medicine

## 2024-01-25 ENCOUNTER — Encounter (HOSPITAL_COMMUNITY)

## 2024-01-27 ENCOUNTER — Emergency Department (HOSPITAL_BASED_OUTPATIENT_CLINIC_OR_DEPARTMENT_OTHER): Admission: EM | Admit: 2024-01-27 | Discharge: 2024-01-27 | Disposition: A | Discharge status: Home / Self Care

## 2024-01-27 ENCOUNTER — Other Ambulatory Visit: Payer: Self-pay

## 2024-01-27 ENCOUNTER — Encounter (HOSPITAL_BASED_OUTPATIENT_CLINIC_OR_DEPARTMENT_OTHER): Payer: Self-pay

## 2024-01-27 DIAGNOSIS — Z91010 Allergy to peanuts: Secondary | ICD-10-CM | POA: Diagnosis not present

## 2024-01-27 DIAGNOSIS — Z79899 Other long term (current) drug therapy: Secondary | ICD-10-CM | POA: Insufficient documentation

## 2024-01-27 DIAGNOSIS — Z7982 Long term (current) use of aspirin: Secondary | ICD-10-CM | POA: Insufficient documentation

## 2024-01-27 DIAGNOSIS — I10 Essential (primary) hypertension: Secondary | ICD-10-CM | POA: Insufficient documentation

## 2024-01-27 DIAGNOSIS — R519 Headache, unspecified: Secondary | ICD-10-CM | POA: Diagnosis present

## 2024-01-27 LAB — COMPREHENSIVE METABOLIC PANEL WITH GFR
ALT: 20 U/L (ref 0–44)
AST: 22 U/L (ref 15–41)
Albumin: 4 g/dL (ref 3.5–5.0)
Alkaline Phosphatase: 149 U/L — ABNORMAL HIGH (ref 38–126)
Anion gap: 15 (ref 5–15)
BUN: 12 mg/dL (ref 8–23)
CO2: 24 mmol/L (ref 22–32)
Calcium: 9.1 mg/dL (ref 8.9–10.3)
Chloride: 104 mmol/L (ref 98–111)
Creatinine, Ser: 0.73 mg/dL (ref 0.61–1.24)
GFR, Estimated: >60 mL/min
Glucose, Bld: 136 mg/dL — ABNORMAL HIGH (ref 70–99)
Potassium: 2.8 mmol/L — ABNORMAL LOW (ref 3.5–5.1)
Sodium: 143 mmol/L (ref 135–145)
Total Bilirubin: 0.3 mg/dL (ref 0.0–1.2)
Total Protein: 6.9 g/dL (ref 6.5–8.1)

## 2024-01-27 LAB — CBC
HCT: 33.9 % — ABNORMAL LOW (ref 39.0–52.0)
Hemoglobin: 11.3 g/dL — ABNORMAL LOW (ref 13.0–17.0)
MCH: 30.5 pg (ref 26.0–34.0)
MCHC: 33.3 g/dL (ref 30.0–36.0)
MCV: 91.4 fL (ref 80.0–100.0)
Platelets: 287 K/uL (ref 150–400)
RBC: 3.71 MIL/uL — ABNORMAL LOW (ref 4.22–5.81)
RDW: 14.4 % (ref 11.5–15.5)
WBC: 11.4 K/uL — ABNORMAL HIGH (ref 4.0–10.5)
nRBC: 0 % (ref 0.0–0.2)

## 2024-01-27 MED ORDER — POTASSIUM CHLORIDE CRYS ER 20 MEQ PO TBCR
40.0000 meq | EXTENDED_RELEASE_TABLET | Freq: Once | ORAL | Status: AC
  Administered 2024-01-27: 40 meq via ORAL
  Filled 2024-01-27: qty 2

## 2024-01-27 NOTE — ED Triage Notes (Signed)
 Pt reports headache x1 day. Pt reports elevated BP and has hx of same. Pt also endorses blurred vision x2 days.

## 2024-01-27 NOTE — ED Provider Notes (Signed)
 " [name]   CSN: [id number] Arrival date & time: 01/27/24  1349     Patient presents with: Headache and Hypertension   Henry Schmitt. is a 64 y.o. male patient who presents to the emergency department today for further evaluation of elevated blood pressure.  Patient states that that her blood pressure was elevated last night.  They were having a headache and some blurred vision prior to this which prompted them to check their blood pressure.  Patient is currently now asymptomatic.  She denies any focal weakness or numbness. No chest pain or shortness of breath.     Headache Hypertension Associated symptoms include headaches.       Prior to Admission medications  Medication Sig Start Date End Date Taking? Authorizing Provider  acetaminophen  (TYLENOL ) 500 MG tablet Take 1,000 mg by mouth every 6 (six) hours as needed for mild pain (pain score 1-3).    [provider]  aspirin  EC 81 MG tablet Take 1 tablet (81 mg total) by mouth daily. Swallow whole. 08/24/23   Milford, Harlene HERO, FNP  atorvastatin  (LIPITOR) 40 MG tablet Take 1 tablet (40 mg total) by mouth daily. 08/24/23   Glena Harlene HERO, FNP  carvedilol  (COREG ) 12.5 MG tablet TAKE 1 TABLET BY MOUTH TWICE DAILY WITH A MEAL 01/24/24   Milford, [place], FNP  [Paused] dapagliflozin  propanediol (FARXIGA ) 10 MG TABS tablet Take 1 tablet (10 mg total) by mouth daily. Wait to take this until your doctor or other care provider tells you to start again. 08/24/23   Milford, Harlene HERO, FNP  digoxin  (LANOXIN ) 0.125 MG tablet Take 1 tablet (0.125 mg total) by mouth daily. 09/11/23   Milford, Harlene HERO, FNP  eplerenone  (INSPRA ) 25 MG tablet Take 1 tablet (25 mg total) by mouth daily. 08/24/23   Milford, Harlene HERO, FNP  furosemide  (LASIX ) 40 MG tablet Take 1 tablet (40 mg total) by mouth daily. 08/24/23 08/23/24  Glena Harlene HERO, FNP  losartan  (COZAAR ) 25 MG tablet Take 1  tablet (25 mg total) by mouth daily. 08/24/23 08/23/24  Glena Harlene HERO, FNP  potassium chloride  SA (KLOR-CON  M) 20 MEQ tablet Take 1 tablet (20 mEq total) by mouth 2 (two) times daily. 10/23/23   Palumbo, April, MD  sertraline (ZOLOFT) 50 MG tablet Take 50 mg by mouth daily. 10/15/23   [provider]  varenicline (CHANTIX) 0.5 MG tablet Take 0.5 mg by mouth daily. 08/29/23   [provider]  zolpidem (AMBIEN) 10 MG tablet Take 10 mg by mouth at bedtime as needed.    [provider]  albuterol  (PROVENTIL  HFA;VENTOLIN  HFA) 108 (90 Base) MCG/ACT inhaler Inhale 2 puffs into the lungs every 4 (four) hours as needed for wheezing or shortness of breath. Patient not taking: Reported on 09/17/2018 12/27/17 10/31/19  Maranda Jamee Jacob, MD    Allergies: Antihistamines, chlorpheniramine-type; Lisinopril; and Peanut-containing drug products    Review of Systems  Neurological:  Positive for headaches.  All other systems reviewed and are negative.   Updated Vital Signs BP (!) 143/89   Pulse 60   Temp 98.3 F (36.8 C)   Resp 20   Ht 5' 6 (1.676 m)   Wt 79.4 kg   SpO2 99%   BMI 28.25 kg/m   Physical Exam Vitals and nursing note reviewed.  Constitutional:      General: He is not in acute distress.    Appearance: Normal appearance.  HENT:  Head: Normocephalic and atraumatic.  Eyes:     General:        Right eye: No discharge.        Left eye: No discharge.  Cardiovascular:     Comments: Regular rate and rhythm.  S1/S2 are distinct without any evidence of murmur, rubs, or gallops.  Radial pulses are 2+ bilaterally.  Dorsalis pedis pulses are 2+ bilaterally.  No evidence of pedal edema. Pulmonary:     Comments: Clear to auscultation bilaterally.  Normal effort.  No respiratory distress.  No evidence of wheezes, rales, or rhonchi heard throughout. Abdominal:     General: Abdomen is flat. Bowel sounds are normal. There is no distension.     Tenderness: There is no  abdominal tenderness. There is no guarding or rebound.  Musculoskeletal:        General: Normal range of motion.     Cervical back: Neck supple.  Skin:    General: Skin is warm and dry.     Findings: No rash.  Neurological:     General: No focal deficit present.     Mental Status: He is alert.     Comments: Cranial nerves II through XII are intact.  5/5 strength to the upper and lower extremities.  Normal sensation to the upper and lower extremities.  Extraocular movements are intact.  No dysmetria with finger-nose.  Psychiatric:        Mood and Affect: Mood normal.        Behavior: Behavior normal.     (all labs ordered are listed, but only abnormal results are displayed) Labs Reviewed  COMPREHENSIVE METABOLIC PANEL WITH GFR - Abnormal; Notable for the following components:      Result Value   Potassium 2.8 (*)    Glucose, Bld 136 (*)    Alkaline Phosphatase 149 (*)    All other components within normal limits  CBC - Abnormal; Notable for the following components:   WBC 11.4 (*)    RBC 3.71 (*)    Hemoglobin 11.3 (*)    HCT 33.9 (*)    All other components within normal limits  URINALYSIS, ROUTINE W REFLEX MICROSCOPIC  CBG MONITORING, ED    EKG: EKG Interpretation Date/Time:  Sunday January 27 2024 14:00:52 EST Ventricular Rate:  65 PR Interval:  168 QRS Duration:  104 QT Interval:  436 QTC Calculation: 453 R Axis:   90  Text Interpretation: Normal sinus rhythm Rightward axis Incomplete right bundle branch block Borderline ECG Confirmed by Ula Barter [phone]) on 01/27/2024 2:04:39 PM  Radiology: No results found.   Procedures   Medications Ordered in the ED  potassium chloride  SA (KLOR-CON  M) CR tablet 40 mEq (40 mEq Oral Given 01/27/24 1605)     Medical Decision Making Henry Schmitt. is a 64 y.o. patient who presents to the emergency department today for further evaluation of elevated blood pressure.  Patient is currently asymptomatic.  Labs are all  reassuring.  She does have some evidence of hypokalemia which I am going to replete with Klor-Con  here.  Patient's blood pressure currently is 143/89 without any medications administered here in the ED.  Will plan to have them follow-up in the outpatient setting with her primary care doctor.  Strict ER precautions were discussed.  They are safe for discharge at this time.   Amount and/or Complexity of Data Reviewed Labs: ordered.  Risk Prescription drug management.     Final diagnoses:  Hypertension, unspecified type  ED Discharge Orders     None          Theotis Cameron HERO, [place] 01/27/24 1609    Bari Roxie HERO, DO 01/27/24 1701  "

## 2024-01-27 NOTE — Discharge Instructions (Addendum)
 Please follow-up with your primary care doctor for further evaluation.  He may return to the emergency department for any worsening symptoms.

## 2024-01-29 ENCOUNTER — Telehealth (HOSPITAL_COMMUNITY): Payer: Self-pay

## 2024-01-29 NOTE — Telephone Encounter (Signed)
 Called to confirm/remind patient of their appointment at the [facility] on 01/29/24.   Appointment:   [] Confirmed  [x] Left mess   [] No answer/No voice mail  [] VM Full/unable to leave message  [] Phone not in service  Patient reminded to bring all medications and/or complete list.  Confirmed patient has transportation. Gave directions, instructed to utilize valet parking.

## 2024-01-30 ENCOUNTER — Ambulatory Visit (HOSPITAL_COMMUNITY): Payer: Self-pay | Admitting: Physician Assistant

## 2024-01-30 ENCOUNTER — Ambulatory Visit (HOSPITAL_COMMUNITY)
Admission: RE | Admit: 2024-01-30 | Discharge: 2024-01-30 | Disposition: A | Discharge status: Home / Self Care | Source: Ambulatory Visit | Attending: Family Medicine | Admitting: Family Medicine

## 2024-01-30 ENCOUNTER — Ambulatory Visit (HOSPITAL_COMMUNITY)
Admission: RE | Admit: 2024-01-30 | Discharge: 2024-01-30 | Disposition: A | Source: Ambulatory Visit | Attending: Family Medicine | Admitting: Family Medicine

## 2024-01-30 ENCOUNTER — Encounter (HOSPITAL_COMMUNITY): Payer: Self-pay

## 2024-01-30 VITALS — BP 170/84 | HR 68 | Ht 66.0 in | Wt 181.4 lb

## 2024-01-30 DIAGNOSIS — G4710 Hypersomnia, unspecified: Secondary | ICD-10-CM | POA: Diagnosis not present

## 2024-01-30 DIAGNOSIS — I10 Essential (primary) hypertension: Secondary | ICD-10-CM

## 2024-01-30 DIAGNOSIS — I5022 Chronic systolic (congestive) heart failure: Secondary | ICD-10-CM | POA: Insufficient documentation

## 2024-01-30 DIAGNOSIS — I110 Hypertensive heart disease with heart failure: Secondary | ICD-10-CM | POA: Insufficient documentation

## 2024-01-30 DIAGNOSIS — Z79899 Other long term (current) drug therapy: Secondary | ICD-10-CM | POA: Insufficient documentation

## 2024-01-30 DIAGNOSIS — I2510 Atherosclerotic heart disease of native coronary artery without angina pectoris: Secondary | ICD-10-CM | POA: Diagnosis not present

## 2024-01-30 DIAGNOSIS — Z720 Tobacco use: Secondary | ICD-10-CM

## 2024-01-30 DIAGNOSIS — Z7982 Long term (current) use of aspirin: Secondary | ICD-10-CM | POA: Insufficient documentation

## 2024-01-30 DIAGNOSIS — Z8673 Personal history of transient ischemic attack (TIA), and cerebral infarction without residual deficits: Secondary | ICD-10-CM | POA: Insufficient documentation

## 2024-01-30 DIAGNOSIS — F17210 Nicotine dependence, cigarettes, uncomplicated: Secondary | ICD-10-CM | POA: Diagnosis not present

## 2024-01-30 DIAGNOSIS — I6523 Occlusion and stenosis of bilateral carotid arteries: Secondary | ICD-10-CM | POA: Insufficient documentation

## 2024-01-30 DIAGNOSIS — R5382 Chronic fatigue, unspecified: Secondary | ICD-10-CM

## 2024-01-30 DIAGNOSIS — R0602 Shortness of breath: Secondary | ICD-10-CM | POA: Diagnosis present

## 2024-01-30 LAB — BASIC METABOLIC PANEL WITH GFR
Anion gap: 10 (ref 5–15)
BUN: 14 mg/dL (ref 8–23)
CO2: 30 mmol/L (ref 22–32)
Calcium: 9.7 mg/dL (ref 8.9–10.3)
Chloride: 101 mmol/L (ref 98–111)
Creatinine, Ser: 0.87 mg/dL (ref 0.61–1.24)
GFR, Estimated: >60 mL/min
Glucose, Bld: 123 mg/dL — ABNORMAL HIGH (ref 70–99)
Potassium: 3.3 mmol/L — ABNORMAL LOW (ref 3.5–5.1)
Sodium: 141 mmol/L (ref 135–145)

## 2024-01-30 LAB — ECHOCARDIOGRAM COMPLETE
AR max vel: 3.12 cm2
AV Area VTI: 3.08 cm2
AV Area mean vel: 2.28 cm2
AV Mean grad: 5 mmHg
AV Peak grad: 7.4 mmHg
Ao pk vel: 1.36 m/s
Area-P 1/2: 3.1 cm2
Calc EF: 57.1 %
S' Lateral: 3.1 cm
Single Plane A2C EF: 55.2 %
Single Plane A4C EF: 58.4 %

## 2024-01-30 MED ORDER — EPLERENONE 50 MG PO TABS: 50.0000 mg | ORAL_TABLET | Freq: Every day | ORAL | 3 refills | Status: AC

## 2024-01-30 MED ORDER — POTASSIUM 99 MG PO TABS: ORAL_TABLET | ORAL | Status: AC

## 2024-01-30 NOTE — Progress Notes (Addendum)
 "  ADVANCED HF CLINIC CONSULT NOTE   Primary Care: [name] Primary Cardiologist: Jerel Balding, MD HF Cardiologist: Assign to Dr. Rolan  HPI: Henry Schmitt. is a 64 y.o. male w/ longstanding uncontrolled HTN, tobacco use, hx CVA and HFrEF.   Admitted 8/25 with CVA and hypertensive emergency. ECG showed new LBBB. MRI brain showed acute right pontine infarct, CTA head/neck showed no LVO, 40% stenosis at the origin of the right cervical ICA, 50% stenosis origin of left cervical ICA, moderate stenosis left cavernous ICA.  Echo EF 30-35%, G1DD. R/LHC showed 85% D1, 90% OM1, 80% m RCA (med management recommended), normal filling pressures and preserved CI. CM out of proportion to his CAD. GDMT titrated.   Seen for [facility] visit 8/25 post discharge and referred to AHF clinic.  Seen in ED 9/25 with perirectal abscess.  Discharged with PO abx and referred to surgery. Later admitted 10/25 with pyelonephritis.  cMRI 10/25: LVEF 46%, focal mid anterior wall infarct with possible viability, no evidence of infiltrative disease, RVEF 52%  Seen in ED a few days ago with headache, HTN and hypokalemia. K 2.8 and given po supplementation. No changes made to antihypertensive medications.  Here today for CHF follow-up. Reports chronic shortness of breath with exertion such as walking long distances, climbing a flight of stairs, walking up in incline. Lives on 2nd floor, has to stop and take a break after climbing stairs to his apartment. No orthopnea, PND or lower extremity edema. Home weight has been stable around 175 lb. Notes poor sleep and daytime fatigue, has not been tested for sleep apnea.  Has cut back smoking but is having trouble with complete cessation. Using chantix.    ECG (personally reviewed): None today   ReDs reading: Not done today   Labs (8/25): K 3.5, creatinine 0.93, LDL 93 Labs (1/26): K 2.8, creatinine 0.73   Cardiac Testing  - cMRI 10/25: LVEF 46%, focal mid  anterior wall infarct with possible viability, no evidence of infiltrative disease, RVEF 52%  - R/LHC (8/25): non-obs CAD, RA 3, PA 25/9 (15), PCWP 6, CO/CI (Fick) 4.29/2.29   - Echo (8/25): EF 30-355, mild LVH, G1DD, normal RV   - Echo (4/17): EF 60-65%, mild LVH    Social Hx: 7 ETOH drinks/week, 1/2 ppd x 40 years, no drugs use but past cocaine x 15 years (quit 25 years ago) Family Hx: mother deceased with CHF   Past Medical History:  Diagnosis Date   Hypertension    Current Outpatient Medications  Medication Sig Dispense Refill   aspirin  EC 81 MG tablet Take 1 tablet (81 mg total) by mouth daily. Swallow whole. 30 tablet 12   atorvastatin  (LIPITOR) 40 MG tablet Take 1 tablet (40 mg total) by mouth daily. 90 tablet 2   carvedilol  (COREG ) 12.5 MG tablet TAKE 1 TABLET BY MOUTH TWICE DAILY WITH A MEAL 90 tablet 0   digoxin  (LANOXIN ) 0.125 MG tablet Take 1 tablet (0.125 mg total) by mouth daily. 60 tablet 11   furosemide  (LASIX ) 40 MG tablet Take 1 tablet (40 mg total) by mouth daily. 90 tablet 2   losartan  (COZAAR ) 25 MG tablet Take 1 tablet (25 mg total) by mouth daily. 90 tablet 1   Potassium 99 MG TABS Over the counter supplement     sertraline (ZOLOFT) 50 MG tablet Take 50 mg by mouth daily.     varenicline (CHANTIX) 0.5 MG tablet Take 0.5 mg by mouth daily.  zolpidem (AMBIEN) 10 MG tablet Take 10 mg by mouth at bedtime as needed.     acetaminophen  (TYLENOL ) 500 MG tablet Take 1,000 mg by mouth every 6 (six) hours as needed for mild pain (pain score 1-3).     eplerenone  (INSPRA ) 50 MG tablet Take 1 tablet (50 mg total) by mouth daily. 90 tablet 3   No current facility-administered medications for this encounter.   Allergies  Allergen Reactions   Antihistamines, Chlorpheniramine-Type Hives and Swelling   Lisinopril Shortness Of Breath and Swelling   Peanut-Containing Drug Products Swelling    ALL TREE NUTS   Social History   Socioeconomic History   Marital status:  Single    Spouse name: Not on file   Number of children: Not on file   Years of education: Not on file   Highest education level: Not on file  Occupational History   Not on file  Tobacco Use   Smoking status: Every Day    Current packs/day: 0.50    Types: Cigarettes   Smokeless tobacco: Never  Vaping Use   Vaping status: Never Used  Substance and Sexual Activity   Alcohol use: Yes    Alcohol/week: 6.0 standard drinks of alcohol    Types: 6 Shots of liquor per week    Comment: 6 times per week   Drug use: Not Currently   Sexual activity: Never  Other Topics Concern   Not on file  Social History Narrative   Not on file   Social Drivers of Health   Tobacco Use: High Risk (01/30/2024)   Patient History    Smoking Tobacco Use: Every Day    Smokeless Tobacco Use: Never    Passive Exposure: Not on file  Financial Resource Strain: Low Risk (02/12/2023)   Received from [facility]   Overall Financial Resource Strain (CARDIA)    Difficulty of Paying Living Expenses: Not very hard  Food Insecurity: No Food Insecurity (10/25/2023)   Epic    Worried About Radiation Protection Practitioner of Food in the Last Year: Never true    Ran Out of Food in the Last Year: Never true  Transportation Needs: No Transportation Needs (10/25/2023)   Epic    Lack of Transportation (Medical): No    Lack of Transportation (Non-Medical): No  Physical Activity: Unknown (02/12/2023)   Received from [facility]   Exercise Vital Sign    On average, how many days per week do you engage in moderate to strenuous exercise (like a brisk walk)?: 0 days    Minutes of Exercise per Session: Not on file  Stress: No Stress Concern Present (02/12/2023)   Received from [facility] of Occupational [facility] - Occupational Stress Questionnaire    Feeling of Stress : Only a little  Social Connections: Moderately Integrated (02/12/2023)   Received from [facility]   Social Network    How would you rate your social  network (family, work, friends)?: Adequate participation with social networks  Intimate Partner Violence: Not At Risk (10/25/2023)   Epic    Fear of Current or Ex-Partner: No    Emotionally Abused: No    Physically Abused: No    Sexually Abused: No  Depression (PHQ2-9): Not on file  Alcohol Screen: Not on file  Housing: Unknown (10/25/2023)   Epic    Unable to Pay for Housing in the Last Year: No    Number of Times Moved in the Last Year: Not on file    Homeless  in the Last Year: No  Utilities: Not At Risk (10/25/2023)   Epic    Threatened with loss of utilities: No  Health Literacy: Not on file   Family History  Problem Relation Age of Onset   Heart failure Mother    Hypertension Mother    Diabetes Other    Wt Readings from Last 3 Encounters:  01/30/24 82.3 kg (181 lb 6.4 oz)  01/27/24 79.4 kg (175 lb)  10/24/23 77.6 kg (171 lb 1.2 oz)   BP (!) 170/84   Pulse 68   Ht 5' 6 (1.676 m)   Wt 82.3 kg (181 lb 6.4 oz)   SpO2 97%   BMI 29.28 kg/m   PHYSICAL EXAM: General:  Well appearing. Ambulated into clinic. Neck: no JVD. Cor: Regular rate & rhythm. No murmurs. Lungs: diminished Abdomen: soft, nontender, nondistended. Extremities: no edema Neuro: alert & orientedx3. Affect pleasant   ASSESSMENT & PLAN: 1. Chronic Systolic Heart Failure: Newly diagnosed in setting of CVA and HTN urgency. Echo showed EF 30-35%, HK in inferior wall, G1DD, normal RV. R/LHC showed 85% D1, 90% OM1, 80% m RCA (med management recommended), normal filling pressures and preserved CI. CM felt to be out or proportion to CAD. Family history of CHF, remote cocaine (>25 years ago), does have longstanding uncontrolled HTN. Also had new LBBB during admission, however subsequent ECG showed iRBBB. cMRI 10/25: LVEF 46%, focal mid anterior wall infarct with possible viability, no evidence of infiltrative disease, RVEF 52% - NYHA II/early III. With longstanding tobacco use wonder if he has underlying COPD  contributing to his chronic dyspnea. Consider PFTs at f/u. - Volume okay on exam. Continue lasix  40 mg daily - Continue losartan  25 mg daily (angioedema with ACEi's so no Entresto) - Continue Coreg  12.5 mg bid. - Keep off farxiga  with recurrent infections, recent perirectal abscess and pyelo - Increase eplerenone  to 50 mg daily (hx gynecomastia) - He has been referred to Cardiac Rehab. - BMET/BNP today, BMET 1 week - F/u in Pharmacy clinic in 2 weeks for medication titration - consider increasing losartan  next - Echo today pending 2. CAD: LHC showed 40 % pRCA, 80% mid RCA, 1st marginal 90%, 1st diagonal 85%. Although coronary stenoses are present, they involve smaller or secondary branches and revascularization not recommended. No chest pain. Continue medical management. - Needs to stop smoking - using chantix but having trouble with complete cessation - Continue ASA 81 mg daily. - Continue atorvastatin  40 mg daily. 3. HTN: Longstanding. Previously treated with amlodipine  and hydrochlorothiazide . LVH on ECG. BP uncontrolled - Continue GDMT as above - Increase eplerenone  as above which will also help with hypokalemia - Refer for sleep study 4. CVA: Right pontine infarct on MRI brain. No residual deficits. - Continue ASA, now off Plavix  per Neurology. - He has neurology follow up soon. 5. Carotid stenosis: CTA head/neck 8/25 showed RICA 40% stenosis, LICA 50% stenosis, moderate stenosis left cavernous ICA.  - Continue ASA and statin. 6. Tobacco use: smoked 1/2 ppd x 40 years. Discussed cessation as above - Now on chantix 7. Daytime sleepiness: Referred to Pulmonary for sleep evaluation - Missed last appointment d/t admission   Follow up 2 weeks w/ PharmD for med titration, 2 months with Dr. Rolan Manuelita Dutch, PA-C 01/30/24   "

## 2024-01-30 NOTE — Patient Instructions (Signed)
 Stop Farxiga . Increase eplerenone  to 50 mg daily - updated Rx sent. Labs today - will call you if abnormal. Repeat labs in one week - see below. Referral re-sent to Pulmonary for your day time fatigue. You may contact them at number below. Return to Heart Failure Pharmacy Clinic in 2 weeks - see below. Return to see Dr. Rolan in Heart Failure Clinic in 2 months - see below. Please call us  at [phone] if any questions or concerns prior to your next appointment.

## 2024-02-01 ENCOUNTER — Telehealth (HOSPITAL_COMMUNITY): Payer: Self-pay | Admitting: *Deleted

## 2024-02-01 NOTE — Telephone Encounter (Signed)
 Called patient per Henry Gainer, NP with following:  Henry wants you to know that your EF ( heart pumping Function) has improved and she would like you to stop your Digoxin .  Pt verbalized understanding of same.

## 2024-02-06 ENCOUNTER — Ambulatory Visit (HOSPITAL_COMMUNITY)

## 2024-02-08 ENCOUNTER — Ambulatory Visit (HOSPITAL_COMMUNITY)
Admission: RE | Admit: 2024-02-08 | Discharge: 2024-02-08 | Disposition: A | Source: Ambulatory Visit | Attending: Internal Medicine

## 2024-02-08 DIAGNOSIS — I5022 Chronic systolic (congestive) heart failure: Secondary | ICD-10-CM | POA: Diagnosis not present

## 2024-02-12 NOTE — Progress Notes (Incomplete)
 ***In Progress***    Advanced Heart Failure Clinic Note   Primary Care: [name] Primary Cardiologist: Jerel Balding, MD HF Cardiologist: Assign to Dr. Rolan  HPI:  Henry Schmitt. is a 64 y.o. male w/ longstanding uncontrolled HTN, tobacco use, hx CVA and HFrEF.   Admitted 08/2023 with CVA and hypertensive emergency. ECG showed new LBBB. MRI brain showed acute right pontine infarct, CTA head/neck showed no LVO, 40% stenosis at the origin of the right cervical ICA, 50% stenosis origin of left cervical ICA, moderate stenosis left cavernous ICA.  Echo EF 30-35%, G1DD. R/LHC showed 85% D1, 90% OM1, 80% m RCA (med management recommended), normal filling pressures and preserved CI. CM out of proportion to his CAD. GDMT titrated.    cMRI 10/2023: LVEF 46%, focal mid anterior wall infarct with possible viability, no evidence of infiltrative disease, RVEF 52%  Last seen in AHF Clinic with PA-C on 01/30/24. Reported chronic shortness of breath with exertion such as walking long distances, climbing a flight of stairs, walking up in incline. Patient had to stop and take a break after climbing stairs to his apartment on the 2nd floor. No orthopnea, PND or lower extremity edema. Home weight was stable ~175 lb. Patient reported poor sleep and daytime fatigue, had not been tested for sleep apnea. Had cut back smoking but is having trouble with complete cessation. Patient was using chantix.   Today he returns to HF clinic for pharmacist medication titration. At last visit with PA-C, eplerenone  was increased to 50 mg daily and digoxin  was discontinued. Noted had an ECHO last visit, LVEF improved to 55-60%. ***  Shortness of breath/dyspnea on exertion? {YES NO:22349}  Orthopnea/PND? {YES NO:22349} Edema? {YES NO:22349} Lightheadedness/dizziness? {YES NO:22349} Daily weights at home? {YES NO:22349} Blood pressure/heart rate monitoring at home? {YES [id number] Following  low-sodium/fluid-restricted diet? {YES NO:22349}  HF Medications: Carvedilol  12.5 mg BID Losartan  25 mg daily Eplerenone  50 mg daily Furosemide  40 mg daily  Has the patient been experiencing any side effects to the medications prescribed?  {YES NO:22349}  Does the patient have any problems obtaining medications due to transportation or finances?   No, patient has [place] Medicaid  Understanding of regimen: {excellent/good/fair/poor:19665} Understanding of indications: {excellent/good/fair/poor:19665} Potential of compliance: {excellent/good/fair/poor:19665} Patient understands to avoid NSAIDs. Patient understands to avoid decongestants.    Pertinent Lab Values: (01/30/24) Serum creatinine 0.87 mg/dL, BUN 14 mg/dL, Potassium 3.3 mmol/L, Sodium 141 mmol/L  Vital Signs: Weight: *** (last clinic weight: 181 lbs) Blood pressure: *** (last clinic BP: 170/84 mmHg) Heart rate: *** (last clinic HR: 68 bpm) ***  Assessment/Plan: 1. Chronic Systolic Heart Failure: Diagnosed in setting of CVA and HTN urgency. Echo showed EF 30-35%, HK in inferior wall, G1DD, normal RV. R/LHC showed 85% D1, 90% OM1, 80% m RCA (med management recommended). CM felt to be out or proportion to CAD. Family history of CHF, remote cocaine (>25 years ago), does have longstanding uncontrolled HTN. Also had new LBBB during admission, however subsequent ECG showed iRBBB. cMRI 10/2023: LVEF 46%, focal mid anterior wall infarct with possible viability, no evidence of infiltrative disease, RVEF 52% - Most recent ECHO 01/30/24: LVEF improved to 55-60% - NYHA II/early III. With longstanding tobacco use wonder if he has underlying COPD contributing to his chronic dyspnea. Consider PFTs at f/u. - Euvolemic on exam. Continue furosemide  40 mg daily *** - Continue carvedilol  12.5 mg BID *** - Continue losartan  25 mg daily (angioedema with ACEi's so no Entresto) *** -  Continue eplerenone  to 50 mg daily (hx gynecomastia) *** - Keep off  farxiga  with recurrent infections, recent perirectal abscess and pyelo - He has been referred to Cardiac Rehab. - BMET/BNP today, BMET 1 week ***  2. CAD: LHC showed 40 % pRCA, 80% mid RCA, 1st marginal 90%, 1st diagonal 85%. Although coronary stenoses are present, they involve smaller or secondary branches and revascularization not recommended. No chest pain. Continue medical management. - Needs to stop smoking - using chantix but having trouble with complete cessation *** - Continue aspirin  81 mg daily. - Continue atorvastatin  40 mg daily.  3. HTN: Longstanding. Previously treated with amlodipine  and hydrochlorothiazide . LVH on ECG. BP uncontrolled - Continue GDMT as above *** - Referred for sleep study by PA-C  4. CVA: Right pontine infarct on MRI brain. No residual deficits. - Continue aspirin , now off clopidogrel  per Neurology. *** - He has neurology follow up soon.  5. Carotid stenosis: CTA head/neck 08/2023 showed RICA 40% stenosis, LICA 50% stenosis, moderate stenosis left cavernous ICA.  - Continue aspirin  and statin.  6. Tobacco use: smoked 1/2 ppd x 40 years. Discussed cessation as above - Now on chantix ***  7. Daytime sleepiness: Referred to Pulmonary for sleep evaluation - Missed last appointment d/t admission ***  Follow up ***  Henry Schmitt, PharmD, BCPS PGY2 Cardiology Pharmacy Resident  Henry Schmitt, PharmD, BCPS, [facility], CPP Heart Failure Clinic Pharmacist [phone]

## 2024-02-13 ENCOUNTER — Ambulatory Visit (HOSPITAL_COMMUNITY)
Admission: RE | Admit: 2024-02-13 | Discharge: 2024-02-13 | Disposition: A | Source: Ambulatory Visit | Attending: Cardiology

## 2024-02-13 ENCOUNTER — Ambulatory Visit (HOSPITAL_COMMUNITY): Payer: Self-pay | Admitting: Cardiology

## 2024-02-13 VITALS — BP 216/112 | HR 80 | Wt 182.8 lb

## 2024-02-13 DIAGNOSIS — F17210 Nicotine dependence, cigarettes, uncomplicated: Secondary | ICD-10-CM | POA: Insufficient documentation

## 2024-02-13 DIAGNOSIS — I110 Hypertensive heart disease with heart failure: Secondary | ICD-10-CM | POA: Insufficient documentation

## 2024-02-13 DIAGNOSIS — I447 Left bundle-branch block, unspecified: Secondary | ICD-10-CM | POA: Insufficient documentation

## 2024-02-13 DIAGNOSIS — I2510 Atherosclerotic heart disease of native coronary artery without angina pectoris: Secondary | ICD-10-CM | POA: Insufficient documentation

## 2024-02-13 DIAGNOSIS — I6523 Occlusion and stenosis of bilateral carotid arteries: Secondary | ICD-10-CM | POA: Insufficient documentation

## 2024-02-13 DIAGNOSIS — I5022 Chronic systolic (congestive) heart failure: Secondary | ICD-10-CM | POA: Insufficient documentation

## 2024-02-13 DIAGNOSIS — Z7982 Long term (current) use of aspirin: Secondary | ICD-10-CM | POA: Insufficient documentation

## 2024-02-13 DIAGNOSIS — Z79899 Other long term (current) drug therapy: Secondary | ICD-10-CM | POA: Insufficient documentation

## 2024-02-13 DIAGNOSIS — Z8673 Personal history of transient ischemic attack (TIA), and cerebral infarction without residual deficits: Secondary | ICD-10-CM | POA: Insufficient documentation

## 2024-02-13 LAB — BASIC METABOLIC PANEL WITH GFR
Anion gap: 10 (ref 5–15)
BUN: 13 mg/dL (ref 8–23)
CO2: 29 mmol/L (ref 22–32)
Calcium: 9.5 mg/dL (ref 8.9–10.3)
Chloride: 101 mmol/L (ref 98–111)
Creatinine, Ser: 0.77 mg/dL (ref 0.61–1.24)
GFR, Estimated: >60 mL/min
Glucose, Bld: 123 mg/dL — ABNORMAL HIGH (ref 70–99)
Potassium: 3.4 mmol/L — ABNORMAL LOW (ref 3.5–5.1)
Sodium: 139 mmol/L (ref 135–145)

## 2024-02-13 LAB — PRO BRAIN NATRIURETIC PEPTIDE: Pro Brain Natriuretic Peptide: 1079 pg/mL — ABNORMAL HIGH

## 2024-02-13 MED ORDER — FUROSEMIDE 40 MG PO TABS: 40.0000 mg | ORAL_TABLET | Freq: Every day | ORAL | 2 refills | Status: AC | PRN

## 2024-02-13 MED ORDER — AMLODIPINE BESYLATE 10 MG PO TABS: 10.0000 mg | ORAL_TABLET | Freq: Every day | ORAL | 3 refills | Status: AC

## 2024-02-13 MED ORDER — HYDRALAZINE HCL 25 MG PO TABS
25.0000 mg | ORAL_TABLET | Freq: Once | ORAL | Status: AC
  Administered 2024-02-13: 25 mg via ORAL
  Filled 2024-02-13: qty 1

## 2024-02-13 MED ORDER — VALSARTAN 80 MG PO TABS: 80.0000 mg | ORAL_TABLET | Freq: Two times a day (BID) | ORAL | 11 refills | Status: AC

## 2024-02-13 NOTE — Progress Notes (Signed)
 "   Advanced Heart Failure Clinic Note   Primary Care: [name] Primary Cardiologist: Jerel Balding, MD HF Cardiologist: Assign to Dr. Rolan  HPI:  Henry Schmitt. is a 64 y.o. male w/ longstanding uncontrolled HTN, tobacco use, hx CVA and HFrEF.   Admitted 08/2023 with CVA and hypertensive emergency. ECG showed new LBBB. MRI brain showed acute right pontine infarct, CTA head/neck showed no LVO, 40% stenosis at the origin of the right cervical ICA, 50% stenosis origin of left cervical ICA, moderate stenosis left cavernous ICA.  Echo EF 30-35%, G1DD. R/LHC showed 85% D1, 90% OM1, 80% m RCA (med management recommended), normal filling pressures and preserved CI. CM out of proportion to his CAD. GDMT titrated.    cMRI 10/2023: LVEF 46%, focal mid anterior wall infarct with possible viability, no evidence of infiltrative disease, RVEF 52%  Last seen in AHF Clinic with PA-C on 01/30/24. Reported chronic shortness of breath with exertion such as walking long distances, climbing a flight of stairs, walking up in incline. Patient had to stop and take a break after climbing stairs to his apartment on the 2nd floor. No orthopnea, PND or lower extremity edema. Home weight was stable ~175 lb. Patient reported poor sleep and daytime fatigue, had not been tested for sleep apnea. Had cut back smoking but is having trouble with complete cessation. Patient was using chantix.   Today he returns to HF clinic for pharmacist medication titration. At last visit with PA-C, eplerenone  was increased to 50 mg daily and digoxin  was discontinued. Noted patient had an ECHO last visit and LVEF improved to 55-60%. Today, patient overall feeling tired. Notably, BP markedly elevated to 216/112 mmHg in clinic today. Patient reports checking BP occasionally at home, reports SBP typically ~180 mmHg. Denied headaches or blurred vision. Discussed BP findings with Dr. Zenaida, administered hydralazine  25 mg x1 in  clinic and BP trended down to 208/112 mmHg after 10 minutes. Reports lightheadedness that worsens when bending down to pick up his dog, denies dizziness. Denies CP or palpitations. Reports SOB is limiting his ability to take dog on walks, but states it has remained constant for past 4-5 months. Checks weight ~1x per week, denies recent rapid weight gain. No LEE noted. Describes episodes of PND occurring ~1x per month, none in the past week. Denies orthopnea. Patient states he frequently has to use the restroom throughout the day and overnight. Reports adherence to all of his medications. States he was unsure if he still needed to take clopidogrel , had found an old pill bottle and restarted medication a few days ago. Patient also attempting to decrease smoking, reports taking Chantix 5x per week. Encouraged patient to continue smoking cessation efforts.   HF Medications: Carvedilol  12.5 mg BID Losartan  25 mg daily Eplerenone  50 mg daily Furosemide  40 mg daily  Has the patient been experiencing any side effects to the medications prescribed?  None reported by patient.  Does the patient have any problems obtaining medications due to transportation or finances?   No, patient has [place] Medicaid  Understanding of regimen: fair Understanding of indications: fair Potential of compliance: fair Patient understands to avoid NSAIDs. Patient understands to avoid decongestants.    Pertinent Lab Values: (01/30/24) Serum creatinine 0.87 mg/dL, BUN 14 mg/dL, Potassium 3.3 mmol/L, Sodium 141 mmol/L  Vital Signs: Weight: 182 lbs (last clinic weight: 181 lbs) Blood pressure: 216/112 mmHg (last clinic BP: 170/84 mmHg) Heart rate: 80 bpm  Assessment/Plan: 1. Chronic Systolic Heart  Failure: Diagnosed in setting of CVA and HTN urgency. Echo showed EF 30-35%, HK in inferior wall, G1DD, normal RV. R/LHC showed 85% D1, 90% OM1, 80% m RCA (med management recommended). CM felt to be out or proportion to CAD. Family  history of CHF, remote cocaine (>25 years ago), does have longstanding uncontrolled HTN. Also had new LBBB during admission, however subsequent ECG showed iRBBB. cMRI 10/2023: LVEF 46%, focal mid anterior wall infarct with possible viability, no evidence of infiltrative disease, RVEF 52% - Most recent ECHO 01/30/24: LVEF improved to 55-60% - NYHA II/early III. With longstanding tobacco use possibly has underlying COPD contributing to his chronic dyspnea. Consider PFTs at f/u per PA-C. - Euvolemic on exam.  - Decrease furosemide  to 40 mg PRN rapid weight gain or SOB. Encouraged patient to check weight daily. Patient reporting frequent urination throughout the day and night. - Continue carvedilol  12.5 mg BID - Stop losartan  and start valsartan  80 mg BID (angioedema with ACEi's so no Entresto). BP uncontrolled in clinic today, up to 216/112 mmHg. - Continue eplerenone  50 mg daily (hx gynecomastia) - Keep off farxiga  with recurrent infections, recent perirectal abscess and pyelo - He has been referred to Cardiac Rehab. - Checked BMET in clinic, K 3.4 mmol/L, suspect potassium will continue to increase with decreasing furosemide  and increasing losartan , repeat BMET in 1 week  2. HTN: Longstanding. Previously treated with amlodipine  and hydrochlorothiazide . LVH on ECG. BP uncontrolled - Markedly elevated BP today, up to 216/112 mmHg, discussed BP findings with Dr. Zenaida - Hydralazine  25 mg x1 administered in clinic, BP trending down to 208/112 mmHg - Stop losartan  and start valsartan  80 mg BID - Restart amlodipine  10 mg daily - Close follow-up in pharmacy clinic in 1 week for further BP management - Continue all other GDMT as above - Referred for sleep study by PA-C  3. CAD: LHC showed 40 % pRCA, 80% mid RCA, 1st marginal 90%, 1st diagonal 85%. Although coronary stenoses are present, they involve smaller or secondary branches and revascularization not recommended. No chest pain. Continue medical  management. - Needs to stop smoking - using chantix but having trouble with complete cessation  - Continue aspirin  81 mg daily. - Continue atorvastatin  40 mg daily.  4. CVA: Right pontine infarct on MRI brain. No residual deficits. - Continue aspirin , now off clopidogrel  per Neurology.  - Patient self-restarted clopidogrel  a few days ago, educated patient that this had been discontinued.  - He has neurology follow up soon.  5. Carotid stenosis: CTA head/neck 08/2023 showed RICA 40% stenosis, LICA 50% stenosis, moderate stenosis left cavernous ICA.  - Continue aspirin  and statin.  6. Tobacco use: smoked 1/2 ppd x 40 years. Discussed cessation as above - Reports still smoking 1/2 ppd, primarily after waking up and after meals - Now on chantix taking ~5x per week  7. Daytime sleepiness: Referred to Pulmonary for sleep evaluation - Missed last appointment d/t admission  Follow up in 1 week with pharmacy clinic Follow up in 1 month with MD  Henry Schmitt, PharmD, BCPS PGY2 Cardiology Pharmacy Resident  Tinnie Redman, PharmD, BCPS, [facility], CPP Heart Failure Clinic Pharmacist [phone]   "

## 2024-02-13 NOTE — Patient Instructions (Signed)
 It was a pleasure seeing you today!  MEDICATIONS: -We are changing your medications today -STOP losartan  -START valsartan  80 mg twice daily tomorrow -START amlodipine  10 mg daily today -DECREASE furosemide  (Lasix ) to 40 mg as needed for weight gain greater than 3 lbs overnight or 5 lbs in a week OR shortness of breath OR lower leg swelling -Call if you have questions about your medications.  LABS: -We will call you if your labs need attention.  NEXT APPOINTMENT: Return to clinic in 1 week with pharmacy clinic.  In general, to take care of your heart failure: -Limit your fluid intake to 2 Liters (half-gallon) per day.   -Limit your salt intake to ideally 2-3 grams (2000-3000 mg) per day. -Weigh yourself daily and record, and bring that weight diary to your next appointment.  (Weight gain of 2-3 pounds in 1 day typically means fluid weight.) -The medications for your heart are to help your heart and help you live longer.   -Please contact us  before stopping any of your heart medications.  Call the clinic at [phone] with questions or to reschedule future appointments.

## 2024-02-19 ENCOUNTER — Ambulatory Visit (HOSPITAL_COMMUNITY)

## 2024-03-03 ENCOUNTER — Ambulatory Visit

## 2024-03-17 ENCOUNTER — Ambulatory Visit (HOSPITAL_COMMUNITY): Admitting: Cardiology
